# Patient Record
Sex: Female | Born: 1940 | Race: White | Hispanic: No | Marital: Married | Smoking: Never smoker
Health system: Southern US, Community
[De-identification: ages and names within clinical notes are randomized; demographics above are authoritative.]

## PROBLEM LIST (undated history)

## (undated) DIAGNOSIS — E079 Disorder of thyroid, unspecified: Secondary | ICD-10-CM

## (undated) DIAGNOSIS — I10 Essential (primary) hypertension: Secondary | ICD-10-CM

## (undated) DIAGNOSIS — C801 Malignant (primary) neoplasm, unspecified: Secondary | ICD-10-CM

## (undated) DIAGNOSIS — E7800 Pure hypercholesterolemia, unspecified: Secondary | ICD-10-CM

## (undated) HISTORY — PX: BREAST CYST ASPIRATION: SHX578

## (undated) HISTORY — PX: HERNIA REPAIR: SHX51

## (undated) HISTORY — PX: OOPHORECTOMY: SHX86

---

## 2004-03-14 ENCOUNTER — Ambulatory Visit: Payer: Self-pay | Admitting: Family Medicine

## 2005-03-19 ENCOUNTER — Ambulatory Visit: Payer: Self-pay | Admitting: Family Medicine

## 2006-07-18 ENCOUNTER — Ambulatory Visit: Payer: Self-pay | Admitting: Family Medicine

## 2006-10-02 ENCOUNTER — Ambulatory Visit: Payer: Self-pay | Admitting: Gastroenterology

## 2007-09-09 ENCOUNTER — Ambulatory Visit: Payer: Self-pay | Admitting: Family Medicine

## 2008-09-13 ENCOUNTER — Ambulatory Visit: Payer: Self-pay | Admitting: Family Medicine

## 2009-07-25 ENCOUNTER — Ambulatory Visit: Payer: Self-pay

## 2009-08-05 ENCOUNTER — Ambulatory Visit: Payer: Self-pay

## 2009-09-07 ENCOUNTER — Ambulatory Visit: Payer: Self-pay | Admitting: Obstetrics & Gynecology

## 2009-09-20 ENCOUNTER — Ambulatory Visit: Payer: Self-pay | Admitting: Obstetrics & Gynecology

## 2009-09-23 LAB — PATHOLOGY REPORT

## 2009-11-14 ENCOUNTER — Encounter: Payer: Self-pay | Admitting: Family Medicine

## 2009-11-14 ENCOUNTER — Ambulatory Visit: Payer: Self-pay | Admitting: Family Medicine

## 2009-12-03 ENCOUNTER — Encounter: Payer: Self-pay | Admitting: Family Medicine

## 2010-01-03 ENCOUNTER — Encounter: Payer: Self-pay | Admitting: Family Medicine

## 2010-12-07 ENCOUNTER — Ambulatory Visit: Payer: Self-pay | Admitting: Family Medicine

## 2010-12-15 ENCOUNTER — Ambulatory Visit: Payer: Self-pay | Admitting: Internal Medicine

## 2010-12-19 ENCOUNTER — Emergency Department: Payer: Self-pay | Admitting: Emergency Medicine

## 2011-07-02 ENCOUNTER — Ambulatory Visit: Payer: Self-pay | Admitting: Family Medicine

## 2011-12-11 ENCOUNTER — Ambulatory Visit: Payer: Self-pay | Admitting: Family Medicine

## 2012-11-07 HISTORY — PX: BREAST BIOPSY: SHX20

## 2013-11-06 ENCOUNTER — Emergency Department: Payer: Self-pay | Admitting: Emergency Medicine

## 2013-11-28 ENCOUNTER — Inpatient Hospital Stay: Payer: Self-pay | Admitting: Internal Medicine

## 2013-11-28 LAB — COMPREHENSIVE METABOLIC PANEL
ALBUMIN: 3.2 g/dL — AB (ref 3.4–5.0)
AST: 16 U/L (ref 15–37)
Alkaline Phosphatase: 62 U/L
Anion Gap: 4 — ABNORMAL LOW (ref 7–16)
BILIRUBIN TOTAL: 0.4 mg/dL (ref 0.2–1.0)
BUN: 18 mg/dL (ref 7–18)
Calcium, Total: 9.2 mg/dL (ref 8.5–10.1)
Chloride: 106 mmol/L (ref 98–107)
Co2: 31 mmol/L (ref 21–32)
Creatinine: 1.27 mg/dL (ref 0.60–1.30)
EGFR (Non-African Amer.): 44 — ABNORMAL LOW
GFR CALC AF AMER: 53 — AB
Glucose: 107 mg/dL — ABNORMAL HIGH (ref 65–99)
Osmolality: 284 (ref 275–301)
Potassium: 4.1 mmol/L (ref 3.5–5.1)
SGPT (ALT): 21 U/L
SODIUM: 141 mmol/L (ref 136–145)
Total Protein: 6.9 g/dL (ref 6.4–8.2)

## 2013-11-28 LAB — URINALYSIS, COMPLETE
BLOOD: NEGATIVE
Bacteria: NONE SEEN
Bilirubin,UR: NEGATIVE
GLUCOSE, UR: NEGATIVE mg/dL (ref 0–75)
Ketone: NEGATIVE
LEUKOCYTE ESTERASE: NEGATIVE
Nitrite: NEGATIVE
Ph: 6 (ref 4.5–8.0)
Protein: NEGATIVE
SPECIFIC GRAVITY: 1.01 (ref 1.003–1.030)

## 2013-11-28 LAB — CBC
HCT: 36.3 % (ref 35.0–47.0)
HGB: 11.4 g/dL — ABNORMAL LOW (ref 12.0–16.0)
MCH: 28.1 pg (ref 26.0–34.0)
MCHC: 31.4 g/dL — AB (ref 32.0–36.0)
MCV: 90 fL (ref 80–100)
PLATELETS: 304 10*3/uL (ref 150–440)
RBC: 4.05 10*6/uL (ref 3.80–5.20)
RDW: 14.5 % (ref 11.5–14.5)
WBC: 8.4 10*3/uL (ref 3.6–11.0)

## 2013-11-28 LAB — LIPASE, BLOOD: LIPASE: 97 U/L (ref 73–393)

## 2013-11-29 LAB — CBC WITH DIFFERENTIAL/PLATELET
BASOS ABS: 0.1 10*3/uL (ref 0.0–0.1)
BASOS PCT: 1 %
Eosinophil #: 0.3 10*3/uL (ref 0.0–0.7)
Eosinophil %: 3.7 %
HCT: 35.1 % (ref 35.0–47.0)
HGB: 11.3 g/dL — ABNORMAL LOW (ref 12.0–16.0)
LYMPHS ABS: 1.5 10*3/uL (ref 1.0–3.6)
Lymphocyte %: 18.8 %
MCH: 28.4 pg (ref 26.0–34.0)
MCHC: 32 g/dL (ref 32.0–36.0)
MCV: 89 fL (ref 80–100)
Monocyte #: 0.9 x10 3/mm (ref 0.2–0.9)
Monocyte %: 10.7 %
NEUTROS ABS: 5.3 10*3/uL (ref 1.4–6.5)
NEUTROS PCT: 65.8 %
PLATELETS: 273 10*3/uL (ref 150–440)
RBC: 3.96 10*6/uL (ref 3.80–5.20)
RDW: 14.6 % — AB (ref 11.5–14.5)
WBC: 8.1 10*3/uL (ref 3.6–11.0)

## 2013-11-29 LAB — BASIC METABOLIC PANEL
Anion Gap: 4 — ABNORMAL LOW (ref 7–16)
BUN: 14 mg/dL (ref 7–18)
CHLORIDE: 106 mmol/L (ref 98–107)
CREATININE: 1.21 mg/dL (ref 0.60–1.30)
Calcium, Total: 8.5 mg/dL (ref 8.5–10.1)
Co2: 31 mmol/L (ref 21–32)
EGFR (African American): 56 — ABNORMAL LOW
GFR CALC NON AF AMER: 46 — AB
Glucose: 87 mg/dL (ref 65–99)
Osmolality: 281 (ref 275–301)
Potassium: 4.4 mmol/L (ref 3.5–5.1)
Sodium: 141 mmol/L (ref 136–145)

## 2013-11-30 LAB — CBC WITH DIFFERENTIAL/PLATELET
Basophil #: 0.1 10*3/uL (ref 0.0–0.1)
Basophil %: 0.9 %
EOS ABS: 0.4 10*3/uL (ref 0.0–0.7)
EOS PCT: 5.2 %
HCT: 34.8 % — ABNORMAL LOW (ref 35.0–47.0)
HGB: 11.1 g/dL — AB (ref 12.0–16.0)
Lymphocyte #: 1.6 10*3/uL (ref 1.0–3.6)
Lymphocyte %: 23 %
MCH: 28.3 pg (ref 26.0–34.0)
MCHC: 31.8 g/dL — ABNORMAL LOW (ref 32.0–36.0)
MCV: 89 fL (ref 80–100)
Monocyte #: 0.8 x10 3/mm (ref 0.2–0.9)
Monocyte %: 11 %
NEUTROS PCT: 59.9 %
Neutrophil #: 4.2 10*3/uL (ref 1.4–6.5)
Platelet: 277 10*3/uL (ref 150–440)
RBC: 3.91 10*6/uL (ref 3.80–5.20)
RDW: 14.6 % — AB (ref 11.5–14.5)
WBC: 7 10*3/uL (ref 3.6–11.0)

## 2013-11-30 LAB — COMPREHENSIVE METABOLIC PANEL
ALBUMIN: 2.9 g/dL — AB (ref 3.4–5.0)
ALK PHOS: 48 U/L
ANION GAP: 4 — AB (ref 7–16)
BUN: 12 mg/dL (ref 7–18)
Bilirubin,Total: 0.4 mg/dL (ref 0.2–1.0)
CALCIUM: 8 mg/dL — AB (ref 8.5–10.1)
CHLORIDE: 106 mmol/L (ref 98–107)
CO2: 30 mmol/L (ref 21–32)
Creatinine: 1.3 mg/dL (ref 0.60–1.30)
EGFR (African American): 52 — ABNORMAL LOW
EGFR (Non-African Amer.): 43 — ABNORMAL LOW
GLUCOSE: 92 mg/dL (ref 65–99)
OSMOLALITY: 279 (ref 275–301)
POTASSIUM: 4.2 mmol/L (ref 3.5–5.1)
SGOT(AST): 16 U/L (ref 15–37)
SGPT (ALT): 16 U/L
Sodium: 140 mmol/L (ref 136–145)
Total Protein: 6.1 g/dL — ABNORMAL LOW (ref 6.4–8.2)

## 2013-12-01 LAB — URINALYSIS, COMPLETE
BACTERIA: NONE SEEN
BILIRUBIN, UR: NEGATIVE
Blood: NEGATIVE
GLUCOSE, UR: NEGATIVE mg/dL (ref 0–75)
KETONE: NEGATIVE
Nitrite: NEGATIVE
PH: 5 (ref 4.5–8.0)
PROTEIN: NEGATIVE
SPECIFIC GRAVITY: 1.009 (ref 1.003–1.030)
Squamous Epithelial: 1

## 2013-12-03 LAB — CBC WITH DIFFERENTIAL/PLATELET
BASOS ABS: 0.1 10*3/uL (ref 0.0–0.1)
Basophil %: 0.8 %
Eosinophil #: 0.5 10*3/uL (ref 0.0–0.7)
Eosinophil %: 7 %
HCT: 33.4 % — AB (ref 35.0–47.0)
HGB: 10.7 g/dL — ABNORMAL LOW (ref 12.0–16.0)
Lymphocyte #: 1.3 10*3/uL (ref 1.0–3.6)
Lymphocyte %: 19.3 %
MCH: 28.7 pg (ref 26.0–34.0)
MCHC: 32.1 g/dL (ref 32.0–36.0)
MCV: 89 fL (ref 80–100)
MONOS PCT: 12.8 %
Monocyte #: 0.9 x10 3/mm (ref 0.2–0.9)
Neutrophil #: 4.2 10*3/uL (ref 1.4–6.5)
Neutrophil %: 60.1 %
Platelet: 289 10*3/uL (ref 150–440)
RBC: 3.74 10*6/uL — ABNORMAL LOW (ref 3.80–5.20)
RDW: 14.9 % — ABNORMAL HIGH (ref 11.5–14.5)
WBC: 6.9 10*3/uL (ref 3.6–11.0)

## 2013-12-03 LAB — BASIC METABOLIC PANEL
ANION GAP: 3 — AB (ref 7–16)
BUN: 14 mg/dL (ref 7–18)
CO2: 30 mmol/L (ref 21–32)
Calcium, Total: 8.4 mg/dL — ABNORMAL LOW (ref 8.5–10.1)
Chloride: 107 mmol/L (ref 98–107)
Creatinine: 1.48 mg/dL — ABNORMAL HIGH (ref 0.60–1.30)
EGFR (African American): 45 — ABNORMAL LOW
EGFR (Non-African Amer.): 37 — ABNORMAL LOW
Glucose: 94 mg/dL (ref 65–99)
OSMOLALITY: 280 (ref 275–301)
Potassium: 4.1 mmol/L (ref 3.5–5.1)
Sodium: 140 mmol/L (ref 136–145)

## 2013-12-03 LAB — CULTURE, BLOOD (SINGLE)

## 2013-12-17 ENCOUNTER — Ambulatory Visit: Payer: Self-pay | Admitting: Family Medicine

## 2014-06-26 NOTE — H&P (Signed)
PATIENT NAME:  Alicia Conley, CLEEK MR#:  [id number] DATE OF BIRTH:  05/28/1940  DATE OF ADMISSION:  11/28/2013  REFERRING EMERGENCY ROOM PHYSICIAN: Elta Guadeloupe R. Jacqualine Code, MD.   PRIMARY CARE PHYSICIAN: [name], MD, at [facility].   CHIEF COMPLAINT: Right-sided flank pain, right upper quadrant abdominal pain.   HISTORY OF PRESENT ILLNESS: This very pleasant, 74 year old woman with past medical history of hypertension, hyperlipidemia, obstructive sleep apnea on CPAP, presents today with 2 days of right-sided flank/right upper quadrant abdominal pain. She states that the pain started abruptly 2 days ago and has continued since. She states that the pain is like someone squeezing this area and then letting go. Nothing improves the pain. Movement definitely makes the pain worse. At its worst, the pain 15/10 on a 10 pain scale. Currently in the emergency room after receiving morphine, it is at about a 2. She denies any nausea, vomiting, but states her appetite has been decreased. No diarrhea. She denies any dysuria or urinary frequency. She states that she has never had similar symptoms. She has no skin rash. No fevers measured, but she states that she has felt hot and cold and slightly diaphoretic over the past few days. Upon presentation to the emergency room, she has had an abdominal ultrasound, which is negative for cholecystitis, positive for cholelithiasis. She also had a CT scan of her abdomen which shows sigmoid diverticulitis with possible microperforation. Hospitalist service is asked to admit for further evaluation and treatment.   PAST MEDICAL HISTORY: 1. Hypertension.  2. Hyperlipidemia.  3. Obstructive sleep apnea, compliant with CPAP.  4. Hypothyroidism.  5. Gastroesophageal reflux disease  6. Mechanical fall on 11/06/2013 with significant injury to the right lower extremity, no fracture.  PAST SURGERIES:  1. Hernia repair.  2. Oophorectomy.   SOCIAL HISTORY: The patient lives  with her husband. She quit smoking 19 to 20 years ago.  Was a light smoker prior to that. She has never consumed alcohol or illicit substances. She has been walking with a cane or a walker since her fall 1 month ago. She states that actually she has been very sedentary since that fall and had her leg elevated for most of the time. She is retired and used to work in a Special educational needs teacher.   FAMILY HISTORY: Her mother and father both had heart attacks and strokes. She has a sister with diabetes type 2. There is no family history of colon cancer.   ALLERGIES: She has no known drug allergies.   HOME MEDICATIONS: 1. Vitamin D3. She takes 1000 international units daily.  2. Triamcinolone 0.1% topical cream.  3. Simvastatin 20 mg oral 1 tablet daily.  4. Omeprazole 20 mg 1 tablet daily.  5. Metoprolol succinate 100 mg 1 tablet twice a day.  6. Levothyroxine 75 mcg 1 tablet once a day.  7. Ketoconazole topical 2% cream applied topically to affected area twice a week.  8. Hydrochlorothiazide 25 mg 1 tablet daily.  9. Gabapentin 600 mg 1 tablet twice a day.  10. Folic acid 0.8 mg 1 tablet daily.  11. Enalapril 20 mg 1 tablet twice a day.  12. Desonide topical 0.05% cream applied once a day as needed.  13. Cyanocobalamin 1000 mcg oral tablet 1 tablet once a day.  14. Coenzyme-Q 200 mg 1 tablet daily.  15. Clobetasol topical cream 0.05 applied topically to affected area twice a day.  16. Calcium, magnesium and zinc supplement 1 tablet twice a day.  17. Aspirin enteric  coated 2 tablets daily.  18. Acetaminophen/hydrocodone 325 mg/5 mg 1 tablet every 8 hours as needed for back pain.   REVIEW OF SYSTEMS:  CONSTITUTIONAL: Positive for chills, a subjective fever and fatigue. Negative for weakness or weight change.  HEENT: No change in vision or hearing. No pain in the eyes or ears. No difficulty swallowing or sore throat.  RESPIRATORY: No cough, shortness of breath, wheezing, asthma, painful respirations.   CARDIOVASCULAR: No chest pain, orthopnea, edema, palpitations, syncope.   GASTROINTESTINAL: No nausea, vomiting, diarrhea, positive for abdominal pain in the right upper quadrant as mentioned. Positive for decreased appetite, positive for history of GERD. No history of rectal bleeding.  GENITOURINARY: Negative for dysuria or frequency.  MUSCULOSKELETAL: Positive for pain in the right lower extremity after a fall. Pain is mainly from above the knee down to the ankle. Also erythema, tenderness and decreased strength in that leg. Otherwise, no other warm, tender or painful joints.  NEUROLOGIC: No focal numbness or weakness. No dysarthria or confusion. No headache, seizure, history of stroke.  PSYCHIATRIC: No change in baseline mood. No history of uncontrolled anxiety or depression.   PHYSICAL EXAMINATION: VITAL SIGNS: Temperature 98.1, pulse 63, respirations 14, blood pressure 118/44, oxygenation 98% on room air.  GENERAL: Patient is uncomfortable, lying in the exam bed.  HEENT: Pupils are round and reactive to light. Conjunctivae are clear with no icterus or injection. Extraocular motion is intact. There is no nasal discharge. Oropharynx is clear. She has upper and lower dentures. There are no oral lesions. Posterior oropharynx does not have any exudate or edema. She does have large tonsils.  NECK: No cervical lymphadenopathy. Trachea is midline. Thyroid is nontender. No thyroid nodule noted.  RESPIRATORY: Lungs are clear to auscultation bilaterally with good air movement.  CARDIOVASCULAR: Heart sounds are distant, regular. No murmurs, rubs or gallops. No peripheral edema. Peripheral pulses are 1+.  ABDOMEN: Diffusely tender, obese. Not distended. Pain seems to be worse in the right upper quadrant as well as the right flank starting at the spine and wrapping around the ribs anteriorly. There is no rash. Bowel sounds are normal.  SKIN: No rash, no open wounds. There is significant erythema and  induration in the right lower extremity from the knee down to the ankle from her prior fall. This area is very tender.  MUSCULOSKELETAL: Bruising around the knee, no effusion. Range of motion of the knee and ankle are normal on the right. She is able to move all extremities equally.  NEUROLOGIC: Cranial nerves 2 through 12 are grossly intact. Strength and sensation intact. This is a nonfocal neurologic examination.  PSYCHIATRIC: The patient is alert and oriented x 4, normal affect, good insight.   LABORATORY DATA: White blood cells 8.4, hemoglobin 11.4, platelets 304,000, MCV is 90. Sodium 141, potassium 4.1, chloride 106, bicarbonate 31. BUN 18, creatinine 1.27, glucose 107. LFTs are normal with the exception of a decreased albumin at 3.2. Lipase 97. Urine negative for signs of infection.   IMAGING:  1. Ultrasound of the abdomen shows cholelithiasis with nonspecific sonographic Murphy's signal. No wall thickening or pericolic cystic fluid to suggest cholecystitis. No biliary dilatation. There are hepatic cysts.  2. CT abdomen and pelvis with contrast shows significant diverticulosis, sigmoid diverticulitis and question of microperforation. No free intraperitoneal air or abscess. There are postoperative changes in the infraumbilical anterior abdominal wall consistent with a prior hernia repair. No herniated bowel. There is cholelithiasis. Left lower lobe and lingular atelectasis. Many hepatic cysts.  ASSESSMENT AND PLAN: 1. Diverticulitis and possible microperforation as seen on CT scan: Continue with Cipro and Flagyl. Continue clear diet. Collect blood cultures. If symptoms get worse, would image again and possibly consult surgery for concern of perforation. She did have right flank and upper quadrant pain, which is less consistent with these findings. If pain continues to be localized, would consider ultrasound of the gallbladder again to see if cholecystitis is developing. I also note that the pain  does seem to be in a dermatologic distribution. Consider zoster prodrome and watch for rash. She states that she has had a booster in the past.  2. Recent fall with injury to the right leg: She has had x-rays showing no fracture. She has been following with orthopedics. Her leg has continued to be erythematous, warm and tender with no open wound. We will continue with elevation. We will get a Doppler to rule out deep vein thrombosis time.  3. Chronic kidney disease stage 3: This is likely chronic. Continue to monitor with a.m. labs.  4. Anemia: This is mild and likely chronic. Continue to monitor with a.m. labs. No concern for current bleeding.  5. Hypertension: Controlled at this time. Continue home regimen.  6. Hypothyroidism: Continue levothyroxine.  7. Hyperlipidemia: Continue statin.  8. Prophylaxis: Heparin for deep vein thrombosis prophylaxis. Protonix, as she does have a history of gastroesophageal reflux disease and is on omeprazole at home.   TIME SPENT ON THIS ADMISSION: 45 minutes.     ____________________________ Earleen Newport. Volanda Napoleon, MD cpw:TT D: 11/28/2013 16:47:45 ET T: 11/28/2013 17:47:21 ET JOB#: [id number]  cc: Barnetta Chapel P. Volanda Napoleon, MD, <Dictator> Floria Raveling. Astrid Divine, MD Aldean Jewett MD ELECTRONICALLY SIGNED 11/29/2013 15:04

## 2014-06-26 NOTE — Consult Note (Signed)
PATIENT NAME:  Alicia Conley, Alicia Conley MR#:  [id number] DATE OF BIRTH:  December 25, 1940  DATE OF CONSULTATION:  11/30/2013  REFERRING PHYSICIAN:   CONSULTING PHYSICIAN:  Harrell Gave A. Sam Overbeck, MD  REASON FOR CONSULTATION: Right-sided back pain radiating to the front and a CT scan concerning for diverticulitis.   HISTORY OF PRESENT ILLNESS: Alicia Conley is a pleasant 74 year old female with history of prior abdominal surgery in 2009 from a hernia secondary to a previous surgery from endometriosis at which time they took out her ovaries as well as hypertension who presents with now 4 days of right-sided flank and back pain going up radiating to the right upper quadrant. Says it began approximately 4 days ago acutely in the evening, has continued to hurt but has improved and almost resolved with Vicodin and initially morphine, but Vicodin works better. She says that movement makes the pain worse. She did have some nausea today with p.o. but did not report any nausea at the time of admission. No fevers, chills, night sweats, shortness of breath, cough, chest pain, diarrhea, constipation, dysuria or hematuria.   She had a CT scan which showed diverticulitis with possible perforation and contrast extravasation, was noted also to have gallstones; however, labs have been completely normal during admission.   PAST MEDICAL HISTORY:  1.  History of a hernia repair in 2009 at [facility].  2.  History of abdominal surgery for endometriosis at which time they removed her ovaries but did not remove her uterus, according to her.  3.  Hypertension.  4.  Hyperlipidemia.  5.  Obstructive sleep apnea.  6.  History of fall on 11/06/2013 with pain in the right lower extremity, did not have this pain at that time. 7.  Hypothyroidism.  8.  Gastroesophageal reflux disease.   SOCIAL HISTORY: No tobacco or alcohol use for at least 20 years. Retired from work at a [employer] educational needs teacher.  FAMILY HISTORY: Two granddaughters and a sister with  diabetes mellitus. Mother and father history of MIs and strokes. No history of colon cancer. Last colonoscopy with Dr. Allen Norris in 2008.   REVIEW OF SYSTEMS: Twelve-point review of systems was obtained. Pertinent positives and negatives above.   ALLERGIES: No known drug allergies.   HOME MEDICATIONS:  1.  Vitamin D3. 2.  Triamcinolone.  3.  Simvastatin. 4.  5.  Metoprolol.  6.  L-thyroxine.  7.  Ketaconazole topical.  8.  Hydrochlorothiazide. 9.  Neurontin.  10.  Folate.  11.  Enalapril.  12.  Desonide topical.  13.  Cyanocobalamin p.o. daily.  14.  Coenzyme Q.  15.  Clobetasol topical.  16.  Calcium.  17.  Magnesium zinc supplement.  18.  Aspirin enteric-coated b.i.d.  19.  Norco p.r.n. for back pain.   PHYSICAL EXAMINATION:  VITAL SIGNS: Currently, temperature of 97.9, pulse 63, blood pressure 134/73, respirations 18.  GENERAL: No acute distress. Alert and oriented x 3.  HEAD: Normocephalic, atraumatic.  EYES: No scleral icterus. No conjunctivitis.  FACE: No evidence of facial trauma. Normal external nose. Normal external ears. CHEST: Lungs clear to auscultation. Moving air well.  HEART: Regular rate and rhythm. No murmurs, rubs or gallops.  ABDOMEN: Soft, tender to palpation, right upper quadrant as well as suprapubic region.  EXTREMITIES: Moves all extremities well. Strength 5/5.  NEUROLOGIC: Cranial nerves II-XII grossly intact. Sensation intact in all 4 extremities.   LABORATORY DATA: Currently white cell count of 7.0 with 60% neutrophils. Labs otherwise unremarkable.   IMAGING: CT scan shows diverticulitis with  some questionable microperforation with small amounts of fluid extravasation. History of abdominal hernia repair without herniation. Gallstones. Ultrasound shows cholelithiasis with no gallbladder wall thickening or pericholecystic fluid.   ASSESSMENT AND PLAN: Alicia Conley is a pleasant 74 year old with right back pain radiating to the front, also has a  questionable diverticulitis and suprapubic pain, is not interested in cholecystectomy, however, unlikely that two things are going on at once. Pain may be due to gallstones but would consider treating for diverticulitis and foregoing any workup of the gallbladder as well as surgical interventions due to possible complications of the previous hernia repair. Continue IV antibiotics, transition to p.o. when able to tolerate p.o. and pain is improved. We will continue to follow.    ____________________________ Glena Norfolk Jerimah Witucki, MD cal:TT D: 11/30/2013 20:07:33 ET T: 11/30/2013 20:46:56 ET JOB#: [id number]  cc: Harrell Gave A. Webber Michiels, MD, <Dictator> Floyde Parkins MD ELECTRONICALLY SIGNED 12/02/2013 0:59

## 2014-06-26 NOTE — Consult Note (Signed)
Brief Consult Note: Diagnosis: L5/S1 spondylolysis.   Patient was seen by consultant.   Comments: encouraged patient to ambulate with PT and to try exercise. has been to [place] [facility]. may need fustion when abdominal complaints better Recommend follow up with Dr Claiborne Rigg at [facility].  Electronic Signatures: Laurene Footman (MD)  (Signed 29-Sep-15 14:00)  Authored: Brief Consult Note   Last Updated: 29-Sep-15 14:00 by Laurene Footman (MD)

## 2014-06-26 NOTE — Discharge Summary (Signed)
PATIENT NAME:  Alicia Conley, Alicia Conley MR#:  [id number] DATE OF BIRTH:  Feb 16, 1941  DATE OF ADMISSION:  11/28/2013 DATE OF DISCHARGE:  12/03/2013  DISCHARGE DIAGNOSES.  1.  S1-L5 spondylolysis.  2.  Sigmoid diverticulitis.  3.  Mechanical fall on 12/06/2013 with significant injury to the right lower extremity, no fracture, no deep venous thrombosis.  4.  Chronic kidney disease.   5.  Chronic kidney disease stage III.  6.  Obstructive sleep apnea, on CPAP.  7.  Hypothyroidism.  8.  Hypertension.  9.  Hyperlipidemia.  10.  Gastroesophageal reflux disease.   CONSULTATIONS:  1.  Dr. Marlyce Huge, general surgery.  2.  Dr. Legrand Como, orthopedics.   PROCEDURES:  1.  CT abdomen and pelvis with contrast September 26. It showed significant diverticulosis. Sigmoid diverticulitis with question of microperforation. No intraperitoneal free air or abscess. Postoperative changes in the infraumbilical anterior abdominal wall consistent with prior hernia repair. No herniating bowel. Cholelithiasis without cholecystitis. Left lower lobe and lingular atelectasis. Numerous hepatic cysts.  2.  Ultrasound of the abdomen shows cholelithiasis with nonspecific sonographic Murphy's sign. No wall thickening or pericolic cystic fluid. No biliary dilation. Multiple hepatic cysts.   3.  Doppler of right lower extremity shows no evidence of DVT, September 27.  4.  Lumbar x-ray September 29, shows no acute findings. Mild multilevel lumbar spine degenerative disk disease worse at L2 and L3   HISTORY OF PRESENT ILLNESS: This very pleasant 75 year old woman with past medical history of hypertension, hyperlipidemia, obstructive sleep apnea on CPAP, chronic back pain, presents with 2 days of right-sided flank right upper quadrant abdominal pain. At its worst, pain is described as 15/10 on a 10 pain scale. Denies any other gastrointestinal symptoms. Denies any skin rash, fevers.   HOSPITAL COURSE BY PROBLEM:  1.  S1-L5  spondylolysis: Her right-sided flank and upper abdominal pain is most likely attributed to radiculopathy from this area. She was seen by Dr. Rudene Christians in hospital who recommended she follow with [facility]. She has an appointment made with Dr. Claiborne Rigg on 12/12/2013. Her pain is currently controlled on prior oral regimen. Differential also included cholecystitis; however, ultrasound and abdominal CT were negative. She never developed a skin rash to suggest varicella zoster infection.  2.  Sigmoid diverticulitis with possible microperforation seen on abdominal CT. Blood cultures were negative. She was started on IV Cipro and Flagyl and changed to p.o. and will continue for a complete 10 day course. She has been tolerating a full diet. She has mild diffuse abdominal tenderness with deep palpation at the time of discharge. Otherwise, she is eating, passing stool. No significant discomfort.  3.  Recent mechanical fall September 4 with injury to the right leg: Doppler was performed to DVT. There was no fracture. She will continue with prior outpatient treatment plan for this.  4.  Chronic kidney disease stage III: Stable. She will continue to follow in the outpatient setting for this.   5.  Hypertension: Her blood pressure remained controlled during this hospitalization on her home regimen. No changes were made.   DISCHARGE MEDICATIONS: 1.  Metoprolol succinate 100 mg tablet extended release, 1 tablet twice a day.  2.  Omeprazole 20 mg 1 tablet daily.  3.  Hydrochlorothiazide 25 mg 1 tablet daily.  4.  Simvastatin 20 mg 1 tablet daily.  5.  Enalapril 20 mg 1 tablet twice a day.  6.  Vitamin D3 at 1000 international units daily.  7.  Coenzyme  Q10 at 200 mg daily.  8.  Folic acid 0.8 mg 1 tablet daily.  9.  Aspirin 81 mg 1 tablet daily.  10.   Calcium, magnesium, and zinc 2 tablets daily.  11.   Gabapentin 60 mg 1 tablet orally twice a day.  12.   Levothyroxine 75 mg 1 tablet once a day.   13.   Clobetasol topical 0.05% topical cream, apply to affected area 2 times a day.  14.   Ketoconazole topical 2% shampoo, apply to affected area 2 times a week.  15.   Desonide topical 0.05 topical cream, apply to affected area once a day as needed.  16.   Cyanocobalamin 1000 mcg oral tablet 1 tablet once a day.  17.   Acetaminophen/hydrocodone 325 mg/5 mg oral tablet 1 tablet every 8 hours as needed for pain.  18.   Metronidazole 500 mg oral tablet 1 tablet orally every 8 hours until 12/07/2013 and then stop.  19.   Ciprofloxacin 500 mg oral tablet 1 tablet orally every 12 hours until 12/07/2013 and then stop.   PHYSICAL EXAMINATION ON DAY OF DISCHARGE:  VITAL SIGNS: Temperature 98, heart rate 60, respirations 18, blood pressure 173/83, oxygenation 95% on room air.  GENERAL: No acute distress.  CARDIOVASCULAR: Regular rate and rhythm, no murmurs, rubs, or gallops.  PULMONARY: Lungs are clear to auscultation bilaterally with good air movement.  ABDOMEN: Soft, bowel sounds are normal. There is diffuse mild tenderness with deep palpation.  MUSCULOSKELETAL: There is tenderness with palpation or with torso movement from the spine in the lumbar area wrapping around the right flank and extending into the right groin.  SKIN: No rash or skin change noted.  NEUROLOGIC: Cranial nerves II through XII are grossly intact. Strength and sensation are intact, nonfocal.  PSYCHIATRIC: Calm, alert, and oriented x 4, good insight.   LABORATORY DATA: Sodium 140, potassium 4.1, chloride 107, bicarbonate 30, BUN 14, creatinine 1.48. LFTs normal with the exception of a low serum albumin at 2.9, low serum protein at 6.1. Hemoglobin 10.7, white blood cell count 6.9, platelets 289,000, MCV 89. Blood cultures no growth. Urinalysis shows no sign of infection.   CONDITION ON DISCHARGE: Stable.   DISPOSITION: The patient is discharged home. Her husband is accompanying her and will assist in her care.   INSTRUCTIONS:  She is to maintain a low-fat, low-cholesterol diet. No dietary supplements.   ACTIVITY: As tolerated.   FOLLOWUP: Will follow up on 10/08 at the [facility], and will follow up in 1 to 2 weeks with her primary care physician.   TIME SPENT ON DISCHARGE: 45 minutes.    ____________________________ Earleen Newport. Volanda Napoleon, MD cpw:at D: 12/05/2013 11:24:44 ET T: 12/05/2013 19:09:37 ET JOB#: [id number]  cc: Barnetta Chapel P. Volanda Napoleon, MD, <Dictator> Aldean Jewett MD ELECTRONICALLY SIGNED 12/09/2013 13:33

## 2014-10-20 ENCOUNTER — Other Ambulatory Visit: Payer: Self-pay | Admitting: Family Medicine

## 2014-10-20 DIAGNOSIS — E079 Disorder of thyroid, unspecified: Secondary | ICD-10-CM

## 2014-10-20 DIAGNOSIS — M8580 Other specified disorders of bone density and structure, unspecified site: Secondary | ICD-10-CM

## 2014-11-29 ENCOUNTER — Other Ambulatory Visit: Payer: Self-pay | Admitting: Family Medicine

## 2014-11-29 DIAGNOSIS — Z1231 Encounter for screening mammogram for malignant neoplasm of breast: Secondary | ICD-10-CM

## 2014-12-21 ENCOUNTER — Ambulatory Visit
Admission: RE | Admit: 2014-12-21 | Discharge: 2014-12-21 | Disposition: A | Discharge status: Home / Self Care | Payer: Medicare Other | Source: Ambulatory Visit | Attending: Family Medicine | Admitting: Family Medicine

## 2014-12-21 DIAGNOSIS — Z1231 Encounter for screening mammogram for malignant neoplasm of breast: Secondary | ICD-10-CM | POA: Insufficient documentation

## 2014-12-21 DIAGNOSIS — Z780 Asymptomatic menopausal state: Secondary | ICD-10-CM | POA: Diagnosis not present

## 2014-12-21 DIAGNOSIS — E079 Disorder of thyroid, unspecified: Secondary | ICD-10-CM

## 2014-12-21 DIAGNOSIS — M8580 Other specified disorders of bone density and structure, unspecified site: Secondary | ICD-10-CM | POA: Diagnosis not present

## 2015-12-01 ENCOUNTER — Other Ambulatory Visit: Payer: Self-pay | Admitting: Family Medicine

## 2015-12-01 DIAGNOSIS — Z1231 Encounter for screening mammogram for malignant neoplasm of breast: Secondary | ICD-10-CM

## 2015-12-22 ENCOUNTER — Ambulatory Visit
Admission: RE | Admit: 2015-12-22 | Discharge: 2015-12-22 | Disposition: A | Discharge status: Home / Self Care | Payer: Medicare HMO | Source: Ambulatory Visit | Attending: Family Medicine | Admitting: Family Medicine

## 2015-12-22 ENCOUNTER — Other Ambulatory Visit: Payer: Self-pay | Admitting: Family Medicine

## 2015-12-22 DIAGNOSIS — Z1231 Encounter for screening mammogram for malignant neoplasm of breast: Secondary | ICD-10-CM | POA: Diagnosis present

## 2016-01-18 ENCOUNTER — Other Ambulatory Visit: Payer: Self-pay | Admitting: Neurology

## 2016-01-18 DIAGNOSIS — M6281 Muscle weakness (generalized): Secondary | ICD-10-CM

## 2016-02-08 ENCOUNTER — Ambulatory Visit: Admission: RE | Admit: 2016-02-08 | Payer: Medicare HMO | Source: Ambulatory Visit

## 2016-02-23 ENCOUNTER — Ambulatory Visit
Admission: RE | Admit: 2016-02-23 | Discharge: 2016-02-23 | Disposition: A | Discharge status: Home / Self Care | Payer: Medicare HMO | Source: Ambulatory Visit | Attending: Neurology | Admitting: Neurology

## 2016-02-23 DIAGNOSIS — H0520 Unspecified exophthalmos: Secondary | ICD-10-CM | POA: Insufficient documentation

## 2016-02-23 DIAGNOSIS — M4802 Spinal stenosis, cervical region: Secondary | ICD-10-CM | POA: Insufficient documentation

## 2016-02-23 DIAGNOSIS — G9389 Other specified disorders of brain: Secondary | ICD-10-CM | POA: Diagnosis not present

## 2016-02-23 DIAGNOSIS — M6281 Muscle weakness (generalized): Secondary | ICD-10-CM | POA: Insufficient documentation

## 2016-04-03 ENCOUNTER — Encounter
Admission: RE | Disposition: A | Discharge status: Home / Self Care | Payer: Self-pay | Source: Ambulatory Visit | Attending: Cardiology

## 2016-04-03 ENCOUNTER — Ambulatory Visit
Admission: RE | Admit: 2016-04-03 | Discharge: 2016-04-03 | Disposition: A | Discharge status: Home / Self Care | Payer: Medicare HMO | Source: Ambulatory Visit | Attending: Cardiology | Admitting: Cardiology

## 2016-04-03 DIAGNOSIS — Z820 Family history of epilepsy and other diseases of the nervous system: Secondary | ICD-10-CM | POA: Insufficient documentation

## 2016-04-03 DIAGNOSIS — Z808 Family history of malignant neoplasm of other organs or systems: Secondary | ICD-10-CM | POA: Insufficient documentation

## 2016-04-03 DIAGNOSIS — R0600 Dyspnea, unspecified: Secondary | ICD-10-CM | POA: Insufficient documentation

## 2016-04-03 DIAGNOSIS — G4733 Obstructive sleep apnea (adult) (pediatric): Secondary | ICD-10-CM | POA: Diagnosis not present

## 2016-04-03 DIAGNOSIS — M1990 Unspecified osteoarthritis, unspecified site: Secondary | ICD-10-CM | POA: Insufficient documentation

## 2016-04-03 DIAGNOSIS — I10 Essential (primary) hypertension: Secondary | ICD-10-CM | POA: Insufficient documentation

## 2016-04-03 DIAGNOSIS — I6523 Occlusion and stenosis of bilateral carotid arteries: Secondary | ICD-10-CM | POA: Insufficient documentation

## 2016-04-03 DIAGNOSIS — Z79899 Other long term (current) drug therapy: Secondary | ICD-10-CM | POA: Insufficient documentation

## 2016-04-03 DIAGNOSIS — E039 Hypothyroidism, unspecified: Secondary | ICD-10-CM | POA: Insufficient documentation

## 2016-04-03 DIAGNOSIS — Z8349 Family history of other endocrine, nutritional and metabolic diseases: Secondary | ICD-10-CM | POA: Insufficient documentation

## 2016-04-03 DIAGNOSIS — Z90722 Acquired absence of ovaries, bilateral: Secondary | ICD-10-CM | POA: Insufficient documentation

## 2016-04-03 DIAGNOSIS — E785 Hyperlipidemia, unspecified: Secondary | ICD-10-CM | POA: Diagnosis not present

## 2016-04-03 DIAGNOSIS — I493 Ventricular premature depolarization: Secondary | ICD-10-CM | POA: Diagnosis not present

## 2016-04-03 DIAGNOSIS — F329 Major depressive disorder, single episode, unspecified: Secondary | ICD-10-CM | POA: Diagnosis not present

## 2016-04-03 DIAGNOSIS — K219 Gastro-esophageal reflux disease without esophagitis: Secondary | ICD-10-CM | POA: Diagnosis not present

## 2016-04-03 DIAGNOSIS — G8929 Other chronic pain: Secondary | ICD-10-CM | POA: Diagnosis not present

## 2016-04-03 DIAGNOSIS — L409 Psoriasis, unspecified: Secondary | ICD-10-CM | POA: Diagnosis not present

## 2016-04-03 DIAGNOSIS — Z8249 Family history of ischemic heart disease and other diseases of the circulatory system: Secondary | ICD-10-CM | POA: Insufficient documentation

## 2016-04-03 DIAGNOSIS — M8580 Other specified disorders of bone density and structure, unspecified site: Secondary | ICD-10-CM | POA: Diagnosis not present

## 2016-04-03 DIAGNOSIS — D680 Von Willebrand's disease: Secondary | ICD-10-CM | POA: Insufficient documentation

## 2016-04-03 DIAGNOSIS — M5442 Lumbago with sciatica, left side: Secondary | ICD-10-CM | POA: Diagnosis not present

## 2016-04-03 DIAGNOSIS — Z823 Family history of stroke: Secondary | ICD-10-CM | POA: Insufficient documentation

## 2016-04-03 DIAGNOSIS — Z7982 Long term (current) use of aspirin: Secondary | ICD-10-CM | POA: Diagnosis not present

## 2016-04-03 DIAGNOSIS — I491 Atrial premature depolarization: Secondary | ICD-10-CM | POA: Diagnosis not present

## 2016-04-03 DIAGNOSIS — Z9849 Cataract extraction status, unspecified eye: Secondary | ICD-10-CM | POA: Diagnosis not present

## 2016-04-03 DIAGNOSIS — R600 Localized edema: Secondary | ICD-10-CM | POA: Diagnosis present

## 2016-04-03 DIAGNOSIS — Z87891 Personal history of nicotine dependence: Secondary | ICD-10-CM | POA: Diagnosis not present

## 2016-04-03 HISTORY — PX: EP IMPLANTABLE DEVICE: SHX172B

## 2016-04-03 SURGERY — LOOP RECORDER INSERTION
Anesthesia: LOCAL

## 2016-04-03 MED ORDER — LIDOCAINE-EPINEPHRINE (PF) 1 %-1:200000 IJ SOLN
INTRAMUSCULAR | Status: AC
  Filled 2016-04-03: qty 30

## 2016-04-03 SURGICAL SUPPLY — 2 items
LOOP REVEAL LINQSYS (Prosthesis & Implant Heart) ×2 IMPLANT
PACK LOOP INSERTION (CUSTOM PROCEDURE TRAY) ×2 IMPLANT

## 2016-04-04 ENCOUNTER — Encounter: Payer: Self-pay | Admitting: Cardiology

## 2016-12-11 ENCOUNTER — Other Ambulatory Visit: Payer: Self-pay | Admitting: Family Medicine

## 2016-12-11 DIAGNOSIS — Z1231 Encounter for screening mammogram for malignant neoplasm of breast: Secondary | ICD-10-CM

## 2017-01-02 ENCOUNTER — Ambulatory Visit
Admission: RE | Admit: 2017-01-02 | Discharge: 2017-01-02 | Disposition: A | Discharge status: Home / Self Care | Payer: Medicare HMO | Source: Ambulatory Visit | Attending: Family Medicine | Admitting: Family Medicine

## 2017-01-02 DIAGNOSIS — Z1231 Encounter for screening mammogram for malignant neoplasm of breast: Secondary | ICD-10-CM | POA: Diagnosis not present

## 2017-07-08 ENCOUNTER — Other Ambulatory Visit: Payer: Self-pay | Admitting: Family Medicine

## 2017-07-08 ENCOUNTER — Ambulatory Visit
Admission: RE | Admit: 2017-07-08 | Discharge: 2017-07-08 | Disposition: A | Discharge status: Home / Self Care | Payer: Medicare HMO | Source: Ambulatory Visit | Attending: Family Medicine | Admitting: Family Medicine

## 2017-07-08 DIAGNOSIS — M79672 Pain in left foot: Secondary | ICD-10-CM

## 2017-07-08 DIAGNOSIS — M7989 Other specified soft tissue disorders: Secondary | ICD-10-CM | POA: Insufficient documentation

## 2017-07-08 DIAGNOSIS — S92912A Unspecified fracture of left toe(s), initial encounter for closed fracture: Secondary | ICD-10-CM | POA: Insufficient documentation

## 2017-07-08 DIAGNOSIS — X58XXXA Exposure to other specified factors, initial encounter: Secondary | ICD-10-CM | POA: Insufficient documentation

## 2017-08-16 ENCOUNTER — Emergency Department
Admission: EM | Admit: 2017-08-16 | Discharge: 2017-08-16 | Disposition: A | Discharge status: Home / Self Care | Payer: Medicare HMO | Attending: Emergency Medicine | Admitting: Emergency Medicine

## 2017-08-16 ENCOUNTER — Encounter: Payer: Self-pay | Admitting: Emergency Medicine

## 2017-08-16 ENCOUNTER — Other Ambulatory Visit: Payer: Self-pay

## 2017-08-16 DIAGNOSIS — I10 Essential (primary) hypertension: Secondary | ICD-10-CM | POA: Insufficient documentation

## 2017-08-16 DIAGNOSIS — K5900 Constipation, unspecified: Secondary | ICD-10-CM | POA: Diagnosis not present

## 2017-08-16 DIAGNOSIS — Z79899 Other long term (current) drug therapy: Secondary | ICD-10-CM | POA: Diagnosis not present

## 2017-08-16 DIAGNOSIS — K529 Noninfective gastroenteritis and colitis, unspecified: Secondary | ICD-10-CM | POA: Diagnosis not present

## 2017-08-16 DIAGNOSIS — R1032 Left lower quadrant pain: Secondary | ICD-10-CM | POA: Diagnosis present

## 2017-08-16 DIAGNOSIS — Z7982 Long term (current) use of aspirin: Secondary | ICD-10-CM | POA: Diagnosis not present

## 2017-08-16 HISTORY — DX: Pure hypercholesterolemia, unspecified: E78.00

## 2017-08-16 HISTORY — DX: Disorder of thyroid, unspecified: E07.9

## 2017-08-16 HISTORY — DX: Essential (primary) hypertension: I10

## 2017-08-16 LAB — COMPREHENSIVE METABOLIC PANEL
ALBUMIN: 4 g/dL (ref 3.5–5.0)
ALT: 32 U/L (ref 14–54)
AST: 42 U/L — AB (ref 15–41)
Alkaline Phosphatase: 61 U/L (ref 38–126)
Anion gap: 11 (ref 5–15)
BUN: 20 mg/dL (ref 6–20)
CHLORIDE: 101 mmol/L (ref 101–111)
CO2: 25 mmol/L (ref 22–32)
CREATININE: 1.03 mg/dL — AB (ref 0.44–1.00)
Calcium: 9.1 mg/dL (ref 8.9–10.3)
GFR calc Af Amer: 60 mL/min — ABNORMAL LOW (ref >60)
GFR, EST NON AFRICAN AMERICAN: 51 mL/min — AB (ref >60)
Glucose, Bld: 115 mg/dL — ABNORMAL HIGH (ref 65–99)
POTASSIUM: 3.5 mmol/L (ref 3.5–5.1)
SODIUM: 137 mmol/L (ref 135–145)
Total Bilirubin: 0.9 mg/dL (ref 0.3–1.2)
Total Protein: 7.7 g/dL (ref 6.5–8.1)

## 2017-08-16 LAB — CBC
HEMATOCRIT: 39.6 % (ref 35.0–47.0)
Hemoglobin: 13.1 g/dL (ref 12.0–16.0)
MCH: 28.4 pg (ref 26.0–34.0)
MCHC: 33 g/dL (ref 32.0–36.0)
MCV: 86.1 fL (ref 80.0–100.0)
Platelets: 326 10*3/uL (ref 150–440)
RBC: 4.6 MIL/uL (ref 3.80–5.20)
RDW: 15.2 % — AB (ref 11.5–14.5)
WBC: 13.5 10*3/uL — AB (ref 3.6–11.0)

## 2017-08-16 LAB — LACTIC ACID, PLASMA: Lactic Acid, Venous: 1.4 mmol/L (ref 0.5–1.9)

## 2017-08-16 LAB — LIPASE, BLOOD: LIPASE: 27 U/L (ref 11–51)

## 2017-08-16 MED ORDER — MAGNESIUM CITRATE PO SOLN: 1.0000 | Freq: Once | ORAL | Status: DC

## 2017-08-16 MED ORDER — FLEET ENEMA 7-19 GM/118ML RE ENEM: 1.0000 | ENEMA | Freq: Once | RECTAL | Status: DC

## 2017-08-16 NOTE — ED Provider Notes (Signed)
[facility] Emergency Department Provider Note  ____________________________________________   First MD Initiated Contact with Patient 08/16/17 1747     (approximate)  I have reviewed the triage vital signs and the nursing notes.   HISTORY  Chief Complaint Abdominal Pain    HPI Alicia Conley is a 77 y.o. female who sent to the emergency department by her primary care physician for evaluation of abnormal CT scan and "bowel obstruction".  Read of the CT scan is below.  Briefly the patient has had roughly 2 weeks of abdominal discomfort.  Her pain is primarily left lower quadrant she was seen in clinic 2 days ago and prescribed ciprofloxacin and Flagyl which she began taking.  She was seen again today with persistent pain so they performed a CT scan which was reportedly abnormal and advised her to come to the emergency department.  Her abdominal pain has been gradual onset slowly progressive now moderate severity intermittent left lower quadrant nonradiating some nausea no vomiting no constipation no diarrhea.  She does have a history of oophorectomy and a hernia repair in her abdomen otherwise no abdominal surgeries.  Impression: 1. The entire descending colon and sigmoid colon appear impacted with stool. There is minimal stranding adjacent to the descending colon which may indicate some inflammation though the wall here does not appear thickened. Distal to this, there does appear to be wall thickening involving the sigmoid colon which could represent colitis acting as partial distal colonic obstruction. Alternatively, the appearance of wall thickening in the sigmoid colon may simply reflect underdistention. There is sigmoid diverticulosis without evidence for active diverticulitis. Proximal to the descending colon, the ascending and transverse colons are mildly distended with air and liquefied stool. 2. Previous midline hernia repair with what appears to be  extensive fat necrosis and scarring, unchanged. 3. Multiple low-density lesions liver favored to be cysts. 4. Small low-density lesions kidneys favored to be cysts though cannot be definitively characterized as such due to size. 2 in the upper to mid left kidney and one in the lower pole right kidney are higher density than others. These may be high density cyst however solid lesions cannot completely be excluded. It is reassuring that they have not changed significantly compared with prior study   Past Medical History:  Diagnosis Date  . High cholesterol   . Hypertension   . Thyroid disease     There are no active problems to display for this patient.   Past Surgical History:  Procedure Laterality Date  . BREAST BIOPSY Left 11/07/12   u/s bx lt -neg  . BREAST CYST ASPIRATION Left    neg  . EP IMPLANTABLE DEVICE N/A 04/03/2016   Procedure: Loop Recorder Insertion;  Surgeon: Isaias Cowman, MD;  Location: [place] [facility];  Service: Cardiovascular;  Laterality: N/A;  . HERNIA REPAIR    . OOPHORECTOMY Bilateral     Prior to Admission medications   Medication Sig Start Date End Date Taking? Authorizing Provider  Aspirin (ASPIR-81 PO) Take 1 tablet by mouth 2 (two) times daily.    [provider]  cholecalciferol (VITAMIN D) 1000 units tablet Take 1,000 Units by mouth daily.    [provider]  Coenzyme Q10 (CO Q 10) 100 MG CAPS Take 1 capsule by mouth daily.    [provider]  DULoxetine (CYMBALTA) 20 MG capsule Take 20 mg by mouth at bedtime.    [provider]  enalapril (VASOTEC) 20 MG tablet Take 20  mg by mouth 2 (two) times daily.    [provider]  fluticasone (FLONASE) 50 MCG/ACT nasal spray Place 1 spray into both nostrils daily as needed for allergies or rhinitis.    [provider]  hydrochlorothiazide (HYDRODIURIL) 25 MG tablet Take 25 mg by mouth daily.    [provider]  levothyroxine  (SYNTHROID, LEVOTHROID) 75 MCG tablet Take 75 mcg by mouth daily before breakfast.    [provider]  metoprolol (LOPRESSOR) 100 MG tablet Take 100 mg by mouth 2 (two) times daily.    [provider]  omeprazole (PRILOSEC) 20 MG capsule Take 20 mg by mouth every other day.    [provider]  simvastatin (ZOCOR) 20 MG tablet Take 20 mg by mouth at bedtime.    [provider]  vitamin B-12 (CYANOCOBALAMIN) 1000 MCG tablet Take 1,000 mcg by mouth daily.    [provider]    Allergies Patient has no known allergies.  Family History  Problem Relation Age of Onset  . Breast cancer Paternal Aunt 43  . Breast cancer Cousin 33       mat cousin    Social History Social History   Tobacco Use  . Smoking status: Never Smoker  . Smokeless tobacco: Never Used  Substance Use Topics  . Alcohol use: Never    Frequency: Never  . Drug use: Never    Review of Systems Constitutional: No fever/chills Eyes: No visual changes. ENT: No sore throat. Cardiovascular: Denies chest pain. Respiratory: Denies shortness of breath. Gastrointestinal: Positive for abdominal pain.  Positive for nausea, no vomiting.  No diarrhea.  No constipation. Genitourinary: Negative for dysuria. Musculoskeletal: Negative for back pain. Skin: Negative for rash. Neurological: Negative for headaches, focal weakness or numbness.   ____________________________________________   PHYSICAL EXAM:  VITAL SIGNS: ED Triage Vitals  Enc Vitals Group     BP 08/16/17 1727 (!) 204/99     Pulse Rate 08/16/17 1727 84     Resp 08/16/17 1727 14     Temp 08/16/17 1727 98.1 F (36.7 C)     Temp Source 08/16/17 1727 Oral     SpO2 08/16/17 1727 94 %     Weight 08/16/17 1728 273 lb (123.8 kg)     Height 08/16/17 1728 5\' 5"  (1.651 m)     Head Circumference --      Peak Flow --      Pain Score 08/16/17 1727 7     Pain Loc --      Pain Edu? --      Excl. in [place]? --      Constitutional: Alert and oriented x4 pleasant cooperative somewhat uncomfortable appearing nontoxic no diaphoresis speaks full clear sentences Eyes: PERRL EOMI. Head: Atraumatic. Nose: No congestion/rhinnorhea. Mouth/Throat: No trismus Neck: No stridor.   Cardiovascular: Normal rate, regular rhythm. Grossly normal heart sounds.  Good peripheral circulation. Respiratory: Normal respiratory effort.  No retractions. Lungs CTAB and moving good air Gastrointestinal: Obese abdomen soft she is tender left lower quadrant with no frank peritonitis Musculoskeletal: No lower extremity edema   Neurologic:  Normal speech and language. No gross focal neurologic deficits are appreciated. Skin:  Skin is warm, dry and intact. No rash noted. Psychiatric: Mood and affect are normal. Speech and behavior are normal.    ____________________________________________   DIFFERENTIAL includes but not limited to  Large bowel obstruction, small bowel obstruction, volvulus, diverticulitis, constipation ____________________________________________   LABS (all labs ordered are listed, but only abnormal  results are displayed)  Labs Reviewed  COMPREHENSIVE METABOLIC PANEL - Abnormal; Notable for the following components:      Result Value   Glucose, Bld 115 (*)    Creatinine, Ser 1.03 (*)    AST 42 (*)    GFR calc non Af Amer 51 (*)    GFR calc Af Amer 60 (*)    All other components within normal limits  CBC - Abnormal; Notable for the following components:   WBC 13.5 (*)    RDW 15.2 (*)    All other components within normal limits  LIPASE, BLOOD  LACTIC ACID, PLASMA    Lab work reviewed by me with slightly elevated white count which is nonspecific __________________________________________  EKG   ____________________________________________  RADIOLOGY  Outpatient CT scan reviewed by me shows no bowel obstruction but is consistent with possible colitis but certainly significant  constipation ____________________________________________   PROCEDURES  Procedure(s) performed: no  Procedures  Critical Care performed: no  Observation: no ____________________________________________   INITIAL IMPRESSION / ASSESSMENT AND PLAN / ED COURSE  Pertinent labs & imaging results that were available during my care of the patient were reviewed by me and considered in my medical decision making (see chart for details).  The patient is slightly hypertensive however overall quite well-appearing.  Her last bowel movement was this morning and she is currently passing flatus.  She is not clinically obstructed.  I have personally reviewed the patient's CT scan and she has no evidence of obstruction.  She is however significantly constipated.  I discussed this with the patient and while she does not report constipation this is not atypical.  Given an enema and mag citrate here and have encouraged her to use MiraLAX magnesium citrate and enemas at home and to follow-up in 2 days for recheck.  Strict return precautions were given and the patient verbalizes understanding and agreement with the plan.      ____________________________________________   FINAL CLINICAL IMPRESSION(S) / ED DIAGNOSES  Final diagnoses:  Constipation, unspecified constipation type  Colitis      NEW MEDICATIONS STARTED DURING THIS VISIT:  Discharge Medication List as of 08/16/2017  6:54 PM       Note:  This document was prepared using Dragon voice recognition software and may include unintentional dictation errors.     Darel Hong, MD 08/17/17 1342

## 2017-08-16 NOTE — ED Triage Notes (Signed)
Pt to ED from home c/o LLQ pain x2 weeks getting worse, states last BM was today but real runny, not passing gas.  Had CT scan today and called with results from PCP that showed patient bowel obstruction.  Denies hx of same.

## 2017-08-16 NOTE — Discharge Instructions (Addendum)
Please begin using MiraLAX twice a day along with magnesium citrate once a day and a fleets enema once a day to help your constipation.  Follow-up with your primary care physician this coming Monday for recheck.  It was a pleasure to take care of you today, and thank you for coming to our emergency department.  If you have any questions or concerns before leaving please ask the nurse to grab me and I'm more than happy to go through your aftercare instructions again.  If you were prescribed any opioid pain medication today such as Norco, Vicodin, Percocet, morphine, hydrocodone, or oxycodone please make sure you do not drive when you are taking this medication as it can alter your ability to drive safely.  If you have any concerns once you are home that you are not improving or are in fact getting worse before you can make it to your follow-up appointment, please do not hesitate to call 911 and come back for further evaluation.  Darel Hong, MD  Results for orders placed or performed during the hospital encounter of 08/16/17  Lipase, blood  Result Value Ref Range   Lipase 27 11 - 51 U/L  Comprehensive metabolic panel  Result Value Ref Range   Sodium 137 135 - 145 mmol/L   Potassium 3.5 3.5 - 5.1 mmol/L   Chloride 101 101 - 111 mmol/L   CO2 25 22 - 32 mmol/L   Glucose, Bld 115 (H) 65 - 99 mg/dL   BUN 20 6 - 20 mg/dL   Creatinine, Ser 1.03 (H) 0.44 - 1.00 mg/dL   Calcium 9.1 8.9 - 10.3 mg/dL   Total Protein 7.7 6.5 - 8.1 g/dL   Albumin 4.0 3.5 - 5.0 g/dL   AST 42 (H) 15 - 41 U/L   ALT 32 14 - 54 U/L   Alkaline Phosphatase 61 38 - 126 U/L   Total Bilirubin 0.9 0.3 - 1.2 mg/dL   GFR calc non Af Amer 51 (L) >60 mL/min   GFR calc Af Amer 60 (L) >60 mL/min   Anion gap 11 5 - 15  CBC  Result Value Ref Range   WBC 13.5 (H) 3.6 - 11.0 K/uL   RBC 4.60 3.80 - 5.20 MIL/uL   Hemoglobin 13.1 12.0 - 16.0 g/dL   HCT 39.6 35.0 - 47.0 %   MCV 86.1 80.0 - 100.0 fL   MCH 28.4 26.0 - 34.0 pg   MCHC 33.0 32.0 - 36.0 g/dL   RDW 15.2 (H) 11.5 - 14.5 %   Platelets 326 150 - 440 K/uL  Lactic acid, plasma  Result Value Ref Range   Lactic Acid, Venous 1.4 0.5 - 1.9 mmol/L

## 2017-08-21 ENCOUNTER — Encounter: Payer: Self-pay | Admitting: Internal Medicine

## 2017-08-21 ENCOUNTER — Inpatient Hospital Stay: Payer: Medicare HMO

## 2017-08-21 ENCOUNTER — Inpatient Hospital Stay
Admission: AD | Admit: 2017-08-21 | Discharge: 2017-10-03 | DRG: 344 | Disposition: E | Payer: Medicare HMO | Attending: Specialist | Admitting: Specialist

## 2017-08-21 ENCOUNTER — Other Ambulatory Visit: Payer: Self-pay

## 2017-08-21 DIAGNOSIS — E872 Acidosis: Secondary | ICD-10-CM | POA: Diagnosis not present

## 2017-08-21 DIAGNOSIS — Z66 Do not resuscitate: Secondary | ICD-10-CM | POA: Diagnosis not present

## 2017-08-21 DIAGNOSIS — E785 Hyperlipidemia, unspecified: Secondary | ICD-10-CM | POA: Diagnosis present

## 2017-08-21 DIAGNOSIS — R062 Wheezing: Secondary | ICD-10-CM | POA: Diagnosis not present

## 2017-08-21 DIAGNOSIS — Y838 Other surgical procedures as the cause of abnormal reaction of the patient, or of later complication, without mention of misadventure at the time of the procedure: Secondary | ICD-10-CM | POA: Diagnosis not present

## 2017-08-21 DIAGNOSIS — C786 Secondary malignant neoplasm of retroperitoneum and peritoneum: Secondary | ICD-10-CM | POA: Diagnosis present

## 2017-08-21 DIAGNOSIS — K56609 Unspecified intestinal obstruction, unspecified as to partial versus complete obstruction: Secondary | ICD-10-CM

## 2017-08-21 DIAGNOSIS — E878 Other disorders of electrolyte and fluid balance, not elsewhere classified: Secondary | ICD-10-CM | POA: Diagnosis not present

## 2017-08-21 DIAGNOSIS — E876 Hypokalemia: Secondary | ICD-10-CM | POA: Diagnosis present

## 2017-08-21 DIAGNOSIS — R109 Unspecified abdominal pain: Secondary | ICD-10-CM

## 2017-08-21 DIAGNOSIS — Z8543 Personal history of malignant neoplasm of ovary: Secondary | ICD-10-CM

## 2017-08-21 DIAGNOSIS — C799 Secondary malignant neoplasm of unspecified site: Secondary | ICD-10-CM | POA: Diagnosis not present

## 2017-08-21 DIAGNOSIS — Z515 Encounter for palliative care: Secondary | ICD-10-CM | POA: Diagnosis not present

## 2017-08-21 DIAGNOSIS — R410 Disorientation, unspecified: Secondary | ICD-10-CM | POA: Diagnosis not present

## 2017-08-21 DIAGNOSIS — E7800 Pure hypercholesterolemia, unspecified: Secondary | ICD-10-CM | POA: Diagnosis present

## 2017-08-21 DIAGNOSIS — T8149XA Infection following a procedure, other surgical site, initial encounter: Secondary | ICD-10-CM | POA: Diagnosis not present

## 2017-08-21 DIAGNOSIS — R1114 Bilious vomiting: Secondary | ICD-10-CM | POA: Diagnosis not present

## 2017-08-21 DIAGNOSIS — K5730 Diverticulosis of large intestine without perforation or abscess without bleeding: Secondary | ICD-10-CM | POA: Diagnosis present

## 2017-08-21 DIAGNOSIS — Z7982 Long term (current) use of aspirin: Secondary | ICD-10-CM

## 2017-08-21 DIAGNOSIS — A419 Sepsis, unspecified organism: Secondary | ICD-10-CM | POA: Diagnosis not present

## 2017-08-21 DIAGNOSIS — E039 Hypothyroidism, unspecified: Secondary | ICD-10-CM | POA: Diagnosis present

## 2017-08-21 DIAGNOSIS — C762 Malignant neoplasm of abdomen: Secondary | ICD-10-CM

## 2017-08-21 DIAGNOSIS — I10 Essential (primary) hypertension: Secondary | ICD-10-CM | POA: Diagnosis not present

## 2017-08-21 DIAGNOSIS — K219 Gastro-esophageal reflux disease without esophagitis: Secondary | ICD-10-CM | POA: Diagnosis present

## 2017-08-21 DIAGNOSIS — Z7189 Other specified counseling: Secondary | ICD-10-CM | POA: Diagnosis not present

## 2017-08-21 DIAGNOSIS — K640 First degree hemorrhoids: Secondary | ICD-10-CM | POA: Diagnosis present

## 2017-08-21 DIAGNOSIS — E860 Dehydration: Secondary | ICD-10-CM | POA: Diagnosis present

## 2017-08-21 DIAGNOSIS — C482 Malignant neoplasm of peritoneum, unspecified: Secondary | ICD-10-CM | POA: Diagnosis not present

## 2017-08-21 DIAGNOSIS — K5900 Constipation, unspecified: Secondary | ICD-10-CM | POA: Diagnosis not present

## 2017-08-21 DIAGNOSIS — Z79890 Hormone replacement therapy (postmenopausal): Secondary | ICD-10-CM

## 2017-08-21 DIAGNOSIS — Z803 Family history of malignant neoplasm of breast: Secondary | ICD-10-CM

## 2017-08-21 DIAGNOSIS — N179 Acute kidney failure, unspecified: Secondary | ICD-10-CM | POA: Diagnosis not present

## 2017-08-21 DIAGNOSIS — D62 Acute posthemorrhagic anemia: Secondary | ICD-10-CM | POA: Diagnosis not present

## 2017-08-21 DIAGNOSIS — K632 Fistula of intestine: Secondary | ICD-10-CM | POA: Diagnosis not present

## 2017-08-21 DIAGNOSIS — G4730 Sleep apnea, unspecified: Secondary | ICD-10-CM | POA: Diagnosis present

## 2017-08-21 DIAGNOSIS — C801 Malignant (primary) neoplasm, unspecified: Secondary | ICD-10-CM

## 2017-08-21 DIAGNOSIS — L03311 Cellulitis of abdominal wall: Secondary | ICD-10-CM | POA: Diagnosis not present

## 2017-08-21 DIAGNOSIS — G92 Toxic encephalopathy: Secondary | ICD-10-CM | POA: Diagnosis not present

## 2017-08-21 DIAGNOSIS — J690 Pneumonitis due to inhalation of food and vomit: Secondary | ICD-10-CM | POA: Diagnosis not present

## 2017-08-21 DIAGNOSIS — F419 Anxiety disorder, unspecified: Secondary | ICD-10-CM | POA: Diagnosis not present

## 2017-08-21 DIAGNOSIS — Z4659 Encounter for fitting and adjustment of other gastrointestinal appliance and device: Secondary | ICD-10-CM

## 2017-08-21 DIAGNOSIS — R1084 Generalized abdominal pain: Secondary | ICD-10-CM | POA: Diagnosis present

## 2017-08-21 DIAGNOSIS — C3490 Malignant neoplasm of unspecified part of unspecified bronchus or lung: Secondary | ICD-10-CM | POA: Diagnosis not present

## 2017-08-21 DIAGNOSIS — J95821 Acute postprocedural respiratory failure: Secondary | ICD-10-CM | POA: Diagnosis not present

## 2017-08-21 DIAGNOSIS — E538 Deficiency of other specified B group vitamins: Secondary | ICD-10-CM | POA: Diagnosis present

## 2017-08-21 DIAGNOSIS — J9601 Acute respiratory failure with hypoxia: Secondary | ICD-10-CM | POA: Diagnosis not present

## 2017-08-21 DIAGNOSIS — K5732 Diverticulitis of large intestine without perforation or abscess without bleeding: Secondary | ICD-10-CM | POA: Diagnosis present

## 2017-08-21 DIAGNOSIS — C787 Secondary malignant neoplasm of liver and intrahepatic bile duct: Secondary | ICD-10-CM | POA: Diagnosis not present

## 2017-08-21 DIAGNOSIS — Z79899 Other long term (current) drug therapy: Secondary | ICD-10-CM

## 2017-08-21 DIAGNOSIS — Z95818 Presence of other cardiac implants and grafts: Secondary | ICD-10-CM

## 2017-08-21 DIAGNOSIS — Z9889 Other specified postprocedural states: Secondary | ICD-10-CM | POA: Diagnosis not present

## 2017-08-21 DIAGNOSIS — K567 Ileus, unspecified: Secondary | ICD-10-CM | POA: Diagnosis present

## 2017-08-21 DIAGNOSIS — Z90722 Acquired absence of ovaries, bilateral: Secondary | ICD-10-CM

## 2017-08-21 DIAGNOSIS — Z6841 Body Mass Index (BMI) 40.0 and over, adult: Secondary | ICD-10-CM | POA: Diagnosis not present

## 2017-08-21 DIAGNOSIS — M4800 Spinal stenosis, site unspecified: Secondary | ICD-10-CM | POA: Diagnosis present

## 2017-08-21 DIAGNOSIS — E079 Disorder of thyroid, unspecified: Secondary | ICD-10-CM | POA: Diagnosis not present

## 2017-08-21 HISTORY — DX: Malignant (primary) neoplasm, unspecified: C80.1

## 2017-08-21 LAB — COMPREHENSIVE METABOLIC PANEL
ALK PHOS: 58 U/L (ref 38–126)
ALT: 30 U/L (ref 14–54)
AST: 48 U/L — AB (ref 15–41)
Albumin: 3.7 g/dL (ref 3.5–5.0)
Anion gap: 11 (ref 5–15)
BILIRUBIN TOTAL: 0.6 mg/dL (ref 0.3–1.2)
BUN: 27 mg/dL — AB (ref 6–20)
CALCIUM: 9.1 mg/dL (ref 8.9–10.3)
CHLORIDE: 105 mmol/L (ref 101–111)
CO2: 24 mmol/L (ref 22–32)
CREATININE: 1.18 mg/dL — AB (ref 0.44–1.00)
GFR, EST AFRICAN AMERICAN: 51 mL/min — AB (ref >60)
GFR, EST NON AFRICAN AMERICAN: 44 mL/min — AB (ref >60)
Glucose, Bld: 114 mg/dL — ABNORMAL HIGH (ref 65–99)
Potassium: 2.6 mmol/L — CL (ref 3.5–5.1)
Sodium: 140 mmol/L (ref 135–145)
TOTAL PROTEIN: 7.3 g/dL (ref 6.5–8.1)

## 2017-08-21 LAB — CBC
HEMATOCRIT: 38.6 % (ref 35.0–47.0)
HEMOGLOBIN: 12.8 g/dL (ref 12.0–16.0)
MCH: 28.9 pg (ref 26.0–34.0)
MCHC: 33.1 g/dL (ref 32.0–36.0)
MCV: 87.2 fL (ref 80.0–100.0)
Platelets: 363 10*3/uL (ref 150–440)
RBC: 4.43 MIL/uL (ref 3.80–5.20)
RDW: 14.8 % — ABNORMAL HIGH (ref 11.5–14.5)
WBC: 12.6 10*3/uL — AB (ref 3.6–11.0)

## 2017-08-21 LAB — PROTIME-INR
INR: 1.05
PROTHROMBIN TIME: 13.6 s (ref 11.4–15.2)

## 2017-08-21 LAB — MAGNESIUM: Magnesium: 2.1 mg/dL (ref 1.7–2.4)

## 2017-08-21 MED ORDER — ONDANSETRON HCL 4 MG/2ML IJ SOLN
4.0000 mg | Freq: Four times a day (QID) | INTRAMUSCULAR | Status: DC | PRN
  Administered 2017-08-21 – 2017-08-27 (×3): 4 mg via INTRAVENOUS
  Filled 2017-08-21 (×4): qty 2

## 2017-08-21 MED ORDER — BISACODYL 10 MG RE SUPP: 10.0000 mg | Freq: Every day | RECTAL | Status: DC | PRN

## 2017-08-21 MED ORDER — ASPIRIN EC 81 MG PO TBEC
81.0000 mg | DELAYED_RELEASE_TABLET | Freq: Every day | ORAL | Status: DC
  Administered 2017-08-22 – 2017-09-07 (×10): 81 mg via ORAL
  Filled 2017-08-21 (×11): qty 1

## 2017-08-21 MED ORDER — FAMOTIDINE IN NACL 20-0.9 MG/50ML-% IV SOLN
20.0000 mg | INTRAVENOUS | Status: DC
  Administered 2017-08-21 – 2017-08-28 (×8): 20 mg via INTRAVENOUS
  Filled 2017-08-21 (×8): qty 50

## 2017-08-21 MED ORDER — FLUTICASONE PROPIONATE 50 MCG/ACT NA SUSP
1.0000 | Freq: Every day | NASAL | Status: DC | PRN
Start: 2017-08-21 — End: 2017-09-08
  Filled 2017-08-21: qty 16

## 2017-08-21 MED ORDER — ASPIRIN EC 81 MG PO TBEC: 81.0000 mg | DELAYED_RELEASE_TABLET | Freq: Two times a day (BID) | ORAL | Status: DC

## 2017-08-21 MED ORDER — PANTOPRAZOLE SODIUM 40 MG PO TBEC
40.0000 mg | DELAYED_RELEASE_TABLET | Freq: Every day | ORAL | Status: DC
Start: 2017-08-21 — End: 2017-08-21

## 2017-08-21 MED ORDER — ENALAPRIL MALEATE 20 MG PO TABS
20.0000 mg | ORAL_TABLET | Freq: Two times a day (BID) | ORAL | Status: DC
  Filled 2017-08-21: qty 1

## 2017-08-21 MED ORDER — HYDRALAZINE HCL 20 MG/ML IJ SOLN
10.0000 mg | INTRAMUSCULAR | Status: DC | PRN
  Administered 2017-08-21 – 2017-09-04 (×5): 10 mg via INTRAVENOUS
  Filled 2017-08-21 (×6): qty 1

## 2017-08-21 MED ORDER — SENNOSIDES-DOCUSATE SODIUM 8.6-50 MG PO TABS: 1.0000 | ORAL_TABLET | Freq: Every evening | ORAL | Status: DC | PRN

## 2017-08-21 MED ORDER — DULOXETINE HCL 20 MG PO CPEP
20.0000 mg | ORAL_CAPSULE | Freq: Every day | ORAL | Status: DC
  Filled 2017-08-21: qty 1

## 2017-08-21 MED ORDER — VITAMIN B-12 1000 MCG PO TABS: 1000.0000 ug | ORAL_TABLET | Freq: Every day | ORAL | Status: DC

## 2017-08-21 MED ORDER — MORPHINE SULFATE (PF) 2 MG/ML IV SOLN
2.0000 mg | INTRAVENOUS | Status: DC | PRN
  Administered 2017-08-22 – 2017-08-28 (×19): 2 mg via INTRAVENOUS
  Filled 2017-08-21 (×20): qty 1

## 2017-08-21 MED ORDER — LEVOTHYROXINE SODIUM 50 MCG PO TABS
75.0000 ug | ORAL_TABLET | Freq: Every day | ORAL | Status: DC
Start: 2017-08-22 — End: 2017-09-07
  Administered 2017-08-24 – 2017-09-06 (×9): 75 ug via ORAL
  Filled 2017-08-21 (×13): qty 2

## 2017-08-21 MED ORDER — SERTRALINE HCL 50 MG PO TABS
100.0000 mg | ORAL_TABLET | Freq: Every day | ORAL | Status: DC
  Administered 2017-08-22 – 2017-09-06 (×11): 100 mg via ORAL
  Filled 2017-08-21 (×13): qty 2

## 2017-08-21 MED ORDER — METOPROLOL TARTRATE 50 MG PO TABS
100.0000 mg | ORAL_TABLET | Freq: Two times a day (BID) | ORAL | Status: DC
  Administered 2017-08-22 – 2017-09-06 (×23): 100 mg via ORAL
  Filled 2017-08-21 (×25): qty 2

## 2017-08-21 MED ORDER — ACETAMINOPHEN 650 MG RE SUPP: 650.0000 mg | Freq: Four times a day (QID) | RECTAL | Status: DC | PRN

## 2017-08-21 MED ORDER — POTASSIUM CHLORIDE 10 MEQ/100ML IV SOLN
10.0000 meq | INTRAVENOUS | Status: AC
  Administered 2017-08-21 – 2017-08-22 (×5): 10 meq via INTRAVENOUS
  Filled 2017-08-21: qty 100

## 2017-08-21 MED ORDER — METOPROLOL TARTRATE 50 MG PO TABS: 100.0000 mg | ORAL_TABLET | Freq: Two times a day (BID) | ORAL | Status: DC

## 2017-08-21 MED ORDER — SODIUM CHLORIDE 0.9 % IV SOLN
INTRAVENOUS | Status: DC
  Administered 2017-08-21 – 2017-08-24 (×4): via INTRAVENOUS

## 2017-08-21 MED ORDER — SIMVASTATIN 20 MG PO TABS
20.0000 mg | ORAL_TABLET | Freq: Every day | ORAL | Status: DC
  Filled 2017-08-21: qty 1

## 2017-08-21 MED ORDER — LEVOTHYROXINE SODIUM 50 MCG PO TABS: 75.0000 ug | ORAL_TABLET | Freq: Every day | ORAL | Status: DC

## 2017-08-21 MED ORDER — ENOXAPARIN SODIUM 40 MG/0.4ML ~~LOC~~ SOLN
40.0000 mg | Freq: Two times a day (BID) | SUBCUTANEOUS | Status: DC
  Administered 2017-08-21 – 2017-08-22 (×2): 40 mg via SUBCUTANEOUS
  Filled 2017-08-21 (×2): qty 0.4

## 2017-08-21 MED ORDER — ACETAMINOPHEN 325 MG PO TABS
650.0000 mg | ORAL_TABLET | Freq: Four times a day (QID) | ORAL | Status: DC | PRN
  Administered 2017-08-22 – 2017-08-25 (×2): 650 mg via ORAL
  Filled 2017-08-21 (×2): qty 2

## 2017-08-21 MED ORDER — ONDANSETRON HCL 4 MG PO TABS: 4.0000 mg | ORAL_TABLET | Freq: Four times a day (QID) | ORAL | Status: DC | PRN

## 2017-08-21 MED ORDER — ATORVASTATIN CALCIUM 20 MG PO TABS
80.0000 mg | ORAL_TABLET | Freq: Every day | ORAL | Status: DC
  Administered 2017-08-22 – 2017-09-04 (×9): 80 mg via ORAL
  Filled 2017-08-21 (×11): qty 4

## 2017-08-21 NOTE — Progress Notes (Signed)
Lab called patient Potassium is 2.6. Dr. Hulen Skains new orders given, will continue to monitor.

## 2017-08-21 NOTE — Progress Notes (Signed)
Pharmacy consulted for electrolyte replacement protocol:   Goal of therapy: Electrolytes within normal limits:  K 3.5 - 5.1 Corrected Ca 8.9 - 10.3 Phos 2.5 - 4.6 Mg 1.7 - 2.4   Assessment: Lab Results  Component Value Date   CREATININE 1.18 (H) 08/13/2017   BUN 27 (H) 08/24/2017   NA 140 08/18/2017   K 2.6 (LL) 08/27/2017   CL 105 08/19/2017   CO2 24 08/14/2017    Plan: K 2.6, ordering KCl 7meq iv q1h x 5 doses. Will reheck with AM labs. Will order Magnesium now and replace as necessary for hypomagnesia. Will order am labs per protocol.  Thomasenia Sales, PharmD, MBA, [facility]

## 2017-08-21 NOTE — H&P (Addendum)
[place] [facility] [place] NAME: Alicia Conley    MR#:  [id number]  DATE OF BIRTH:  04/22/1940  DATE OF ADMISSION:  08/27/2017  PRIMARY CARE PHYSICIAN: [name], MD   REQUESTING/REFERRING PHYSICIAN:   CHIEF COMPLAINT:  Abdominal pain  HISTORY OF PRESENT ILLNESS: Alicia Conley  is a 77 y.o. female with a known history of hyperlipidemia, hypertension, hypothyroidism presented to the emergency room with abdominal pain.  The abdominal pain is going on for the last 2-1/2 weeks abdominal pain started 2 end of weeks ago and patient had been worked up with [facility] hospital with a CT abdomen which revealed partial distal colonic obstruction.  The colon was filled up with stool to.  She was seen at our ER on Saturday and was evaluated and discharged back to home.  Patient still has abdominal discomfort and unable to eat and drink any fluids and and saw gastroenterologist Dr. Alice Reichert in the clinic today.  Dr Alice Reichert referred the patient for direct admission to the hospital.  No abdominal pain is generalized 4 out of 10 on a scale of 1-10.  Last bowel movement more than 1 week ago.  PAST MEDICAL HISTORY:   Past Medical History:  Diagnosis Date  . High cholesterol   . Hypertension   . Thyroid disease     PAST SURGICAL HISTORY:  Past Surgical History:  Procedure Laterality Date  . BREAST BIOPSY Left 11/07/12   u/s bx lt -neg  . BREAST CYST ASPIRATION Left    neg  . EP IMPLANTABLE DEVICE N/A 04/03/2016   Procedure: Loop Recorder Insertion;  Surgeon: Isaias Cowman, MD;  Location: [place] [facility];  Service: Cardiovascular;  Laterality: N/A;  . HERNIA REPAIR    . OOPHORECTOMY Bilateral     SOCIAL HISTORY:  Social History   Tobacco Use  . Smoking status: Never Smoker  . Smokeless tobacco: Never Used  Substance Use Topics  . Alcohol use: Never    Frequency: Never    FAMILY HISTORY:  Family History  Problem Relation Age  of Onset  . Breast cancer Paternal Aunt 70  . Breast cancer Cousin 75       mat cousin    DRUG ALLERGIES: No Known Allergies  REVIEW OF SYSTEMS:   CONSTITUTIONAL: No fever, fatigue or weakness.  EYES: No blurred or double vision.  EARS, NOSE, AND THROAT: No tinnitus or ear pain.  RESPIRATORY: No cough, shortness of breath, wheezing or hemoptysis.  CARDIOVASCULAR: No chest pain, orthopnea, edema.  GASTROINTESTINAL: Has nausea,  No vomiting, diarrhea  Has abdominal pain.  GENITOURINARY: No dysuria, hematuria.  ENDOCRINE: No polyuria, nocturia,  HEMATOLOGY: No anemia, easy bruising or bleeding SKIN: No rash or lesion. MUSCULOSKELETAL: No joint pain or arthritis.   NEUROLOGIC: No tingling, numbness, weakness.  PSYCHIATRY: No anxiety or depression.   MEDICATIONS AT HOME:  Prior to Admission medications   Medication Sig Start Date End Date Taking? Authorizing Provider  Aspirin (ASPIR-81 PO) Take 1 tablet by mouth 2 (two) times daily.    [provider]  cholecalciferol (VITAMIN D) 1000 units tablet Take 1,000 Units by mouth daily.    [provider]  Coenzyme Q10 (CO Q 10) 100 MG CAPS Take 1 capsule by mouth daily.    [provider]  DULoxetine (CYMBALTA) 20 MG capsule Take 20 mg by mouth at bedtime.    [provider]  enalapril (VASOTEC) 20 MG tablet Take 20 mg by  mouth 2 (two) times daily.    [provider]  fluticasone (FLONASE) 50 MCG/ACT nasal spray Place 1 spray into both nostrils daily as needed for allergies or rhinitis.    [provider]  hydrochlorothiazide (HYDRODIURIL) 25 MG tablet Take 25 mg by mouth daily.    [provider]  levothyroxine (SYNTHROID, LEVOTHROID) 75 MCG tablet Take 75 mcg by mouth daily before breakfast.    [provider]  metoprolol (LOPRESSOR) 100 MG tablet Take 100 mg by mouth 2 (two) times daily.    [provider]  omeprazole (PRILOSEC) 20 MG capsule Take 20 mg by  mouth every other day.    [provider]  simvastatin (ZOCOR) 20 MG tablet Take 20 mg by mouth at bedtime.    [provider]  vitamin B-12 (CYANOCOBALAMIN) 1000 MCG tablet Take 1,000 mcg by mouth daily.    [provider]      PHYSICAL EXAMINATION:   VITAL SIGNS: Blood pressure (!) 198/77, pulse 61, temperature 97.7 F (36.5 C), temperature source Oral, resp. rate 14, SpO2 99 %.  GENERAL:  77 y.o.-year-old patient lying in the bed with no acute distress.  EYES: Pupils equal, round, reactive to light and accommodation. No scleral icterus. Extraocular muscles intact.  HEENT: Head atraumatic, normocephalic. Oropharynx and nasopharynx clear.  NECK:  Supple, no jugular venous distention. No thyroid enlargement, no tenderness.  LUNGS: Normal breath sounds bilaterally, no wheezing, rales,rhonchi or crepitation. No use of accessory muscles of respiration.  CARDIOVASCULAR: S1, S2 normal. No murmurs, rubs, or gallops.  ABDOMEN: Soft, mild tenderness around the umbilicus, nondistended. Bowel sounds present. No organomegaly or mass.  EXTREMITIES: No pedal edema, cyanosis, or clubbing.  NEUROLOGIC: Cranial nerves II through XII are intact. Muscle strength 5/5 in all extremities. Sensation intact. Gait not checked.  PSYCHIATRIC: The patient is alert and oriented x 3.  SKIN: No obvious rash, lesion, or ulcer.   LABORATORY PANEL:   CBC Recent Labs  Lab 08/16/17 1741  WBC 13.5*  HGB 13.1  HCT 39.6  PLT 326  MCV 86.1  MCH 28.4  MCHC 33.0  RDW 15.2*   ------------------------------------------------------------------------------------------------------------------  Chemistries  Recent Labs  Lab 08/16/17 1741  NA 137  K 3.5  CL 101  CO2 25  GLUCOSE 115*  BUN 20  CREATININE 1.03*  CALCIUM 9.1  AST 42*  ALT 32  ALKPHOS 61  BILITOT 0.9    ------------------------------------------------------------------------------------------------------------------ estimated creatinine clearance is 61.4 mL/min (A) (by C-G formula based on SCr of 1.03 mg/dL (H)). ------------------------------------------------------------------------------------------------------------------ No results for input(s): TSH, T4TOTAL, T3FREE, THYROIDAB in the last 72 hours.  Invalid input(s): FREET3   Coagulation profile No results for input(s): INR, PROTIME in the last 168 hours. ------------------------------------------------------------------------------------------------------------------- No results for input(s): DDIMER in the last 72 hours. -------------------------------------------------------------------------------------------------------------------  Cardiac Enzymes No results for input(s): CKMB, TROPONINI, MYOGLOBIN in the last 168 hours.  Invalid input(s): CK ------------------------------------------------------------------------------------------------------------------ Invalid input(s): POCBNP  ---------------------------------------------------------------------------------------------------------------  Urinalysis    Component Value Date/Time   COLORURINE Yellow 12/01/2013 1850   APPEARANCEUR Clear 12/01/2013 1850   LABSPEC 1.009 12/01/2013 1850   PHURINE 5.0 12/01/2013 1850   GLUCOSEU Negative 12/01/2013 1850   HGBUR Negative 12/01/2013 1850   BILIRUBINUR Negative 12/01/2013 1850   KETONESUR Negative 12/01/2013 1850   PROTEINUR Negative 12/01/2013 1850   NITRITE Negative 12/01/2013 1850   LEUKOCYTESUR Trace 12/01/2013 1850     RADIOLOGY: No results found.  EKG: No orders found for this or any previous visit.  IMPRESSION  AND PLAN:  77 year old female patient with history of hypertension, hyperlipidemia, hypothyroidism referred from GI clinic for direct admission for abdominal discomfort.  -Severe constipation ,  Rule out bowel obstruction Abdominal x-ray Laxatives and PRN edema  -Acute Hypokalemia Replace potassium IV  -Hypertension Resume metoprolol orally if no ileus  -Hyperlipidemia Resume statin orally if no ileus  -Dehydration IV fluids  -DVT prophylaxis with [place] lovenox  All the records are reviewed and case discussed with ED provider. Management plans discussed with the patient, family and they are in agreement.  CODE STATUS:Full code    Code Status Orders  (From admission, onward)        Start     Ordered   08/03/2017 1732  Full code  Continuous     08/13/2017 1731    Code Status History    This patient has a current code status but no historical code status.       TOTAL TIME TAKING CARE OF THIS PATIENT: 51 minutes.    Saundra Shelling M.D on 08/17/2017 at 6:09 PM  Between 7am to 6pm - Pager - [phone] After 6pm go to [email or web address] - password [email or web address] [facility]  [place] [facility]  Office  [phone]  CC: Primary care physician; [name], MD

## 2017-08-21 NOTE — Progress Notes (Signed)
Anticoagulation monitoring(Lovenox):  77 yo  female ordered Lovenox 40 mg Q24h  There were no vitals filed for this visit. BMI 45.43   Lab Results  Component Value Date   CREATININE 1.03 (H) 08/16/2017   CREATININE 1.48 (H) 12/03/2013   CREATININE 1.30 11/30/2013   Estimated Creatinine Clearance: 61.4 mL/min (A) (by C-G formula based on SCr of 1.03 mg/dL (H)). Hemoglobin & Hematocrit     Component Value Date/Time   HGB 13.1 08/16/2017 1741   HGB 10.7 (L) 12/03/2013 0451   HCT 39.6 08/16/2017 1741   HCT 33.4 (L) 12/03/2013 0451     Per Protocol for Patient with estCrcl > 30 ml/min and BMI > 40, will transition to Lovenox 40 mg Q12h.

## 2017-08-21 NOTE — Progress Notes (Signed)
Pharmacy consulted for electrolyte replacement protocol:   Goal of therapy: Electrolytes within normal limits:  K 3.5 - 5.1 Corrected Ca 8.9 - 10.3 Phos 2.5 - 4.6 Mg 1.7 - 2.4   Assessment: Lab Results  Component Value Date   CREATININE 1.18 (H) 08/05/2017   BUN 27 (H) 08/11/2017   NA 140 08/24/2017   K 2.6 (LL) 08/26/2017   CL 105 08/20/2017   CO2 24 08/07/2017    Plan: K 2.6, ordering KCl 2meq iv q1h x 5 doses. Will reheck with AM labs. Will order Magnesium now and replace as necessary for hypomagnesia. Will order am labs per protocol.  6/19  Mag @ 20:30 = 2.1 No additional mag needed, will recheck electrolytes on 6/20 with AM labs.   [facility]

## 2017-08-21 NOTE — Progress Notes (Signed)
Advanced care plan. Purpose of the Encounter: CODE STATUS Parties in Attendance: Patient Patient's Decision Capacity: Good Subjective/Patient's story: Presented to the hospital with abdominal pain  Objective/Medical story Has severe constipation Has to be worked up for ileus and obstruction  Goals of care determination:  Advance care directives and goals of care discussed with the patient For now patient and family wants everything done which includes cardiac resuscitation, intubation and ventilator if the need arises CODE STATUS: Full code Time spent discussing advanced care planning: 16 minutes

## 2017-08-22 ENCOUNTER — Encounter: Payer: Self-pay | Admitting: Radiology

## 2017-08-22 ENCOUNTER — Inpatient Hospital Stay: Payer: Medicare HMO

## 2017-08-22 ENCOUNTER — Telehealth: Payer: Self-pay

## 2017-08-22 DIAGNOSIS — R1084 Generalized abdominal pain: Secondary | ICD-10-CM

## 2017-08-22 LAB — GASTROINTESTINAL PANEL BY PCR, STOOL (REPLACES STOOL CULTURE)
ADENOVIRUS F40/41: NOT DETECTED
ASTROVIRUS: NOT DETECTED
CYCLOSPORA CAYETANENSIS: NOT DETECTED
Campylobacter species: NOT DETECTED
Cryptosporidium: NOT DETECTED
ENTAMOEBA HISTOLYTICA: NOT DETECTED
ENTEROAGGREGATIVE E COLI (EAEC): NOT DETECTED
ENTEROPATHOGENIC E COLI (EPEC): NOT DETECTED
ENTEROTOXIGENIC E COLI (ETEC): NOT DETECTED
GIARDIA LAMBLIA: NOT DETECTED
Norovirus GI/GII: NOT DETECTED
Plesimonas shigelloides: NOT DETECTED
Rotavirus A: NOT DETECTED
SAPOVIRUS (I, II, IV, AND V): NOT DETECTED
Salmonella species: NOT DETECTED
Shiga like toxin producing E coli (STEC): NOT DETECTED
Shigella/Enteroinvasive E coli (EIEC): NOT DETECTED
VIBRIO CHOLERAE: NOT DETECTED
VIBRIO SPECIES: NOT DETECTED
Yersinia enterocolitica: NOT DETECTED

## 2017-08-22 LAB — C DIFFICILE QUICK SCREEN W PCR REFLEX
C DIFFICILE (CDIFF) TOXIN: NEGATIVE
C Diff antigen: NEGATIVE
C Diff interpretation: NOT DETECTED

## 2017-08-22 LAB — BASIC METABOLIC PANEL
ANION GAP: 9 (ref 5–15)
BUN: 24 mg/dL — ABNORMAL HIGH (ref 6–20)
CALCIUM: 8.6 mg/dL — AB (ref 8.9–10.3)
CHLORIDE: 111 mmol/L (ref 101–111)
CO2: 23 mmol/L (ref 22–32)
Creatinine, Ser: 1.17 mg/dL — ABNORMAL HIGH (ref 0.44–1.00)
GFR calc Af Amer: 51 mL/min — ABNORMAL LOW (ref >60)
GFR calc non Af Amer: 44 mL/min — ABNORMAL LOW (ref >60)
GLUCOSE: 117 mg/dL — AB (ref 65–99)
Potassium: 3.2 mmol/L — ABNORMAL LOW (ref 3.5–5.1)
Sodium: 143 mmol/L (ref 135–145)

## 2017-08-22 LAB — LACTOFERRIN, FECAL, QUALITATIVE: LACTOFERRIN, FECAL, QUAL: POSITIVE — AB

## 2017-08-22 LAB — CBC
HCT: 38 % (ref 35.0–47.0)
HEMOGLOBIN: 12.8 g/dL (ref 12.0–16.0)
MCH: 29 pg (ref 26.0–34.0)
MCHC: 33.7 g/dL (ref 32.0–36.0)
MCV: 86.1 fL (ref 80.0–100.0)
Platelets: 348 10*3/uL (ref 150–440)
RBC: 4.42 MIL/uL (ref 3.80–5.20)
RDW: 15.2 % — ABNORMAL HIGH (ref 11.5–14.5)
WBC: 13.5 10*3/uL — ABNORMAL HIGH (ref 3.6–11.0)

## 2017-08-22 MED ORDER — POTASSIUM CHLORIDE 10 MEQ/100ML IV SOLN
10.0000 meq | INTRAVENOUS | Status: AC
  Administered 2017-08-22 (×3): 10 meq via INTRAVENOUS
  Filled 2017-08-22 (×3): qty 100

## 2017-08-22 MED ORDER — ENOXAPARIN SODIUM 40 MG/0.4ML ~~LOC~~ SOLN
40.0000 mg | SUBCUTANEOUS | Status: DC
  Administered 2017-08-23 – 2017-08-28 (×5): 40 mg via SUBCUTANEOUS
  Filled 2017-08-22 (×5): qty 0.4

## 2017-08-22 MED ORDER — IOPAMIDOL (ISOVUE-300) INJECTION 61%
15.0000 mL | INTRAVENOUS | Status: AC
  Administered 2017-08-22: 15 mL via ORAL

## 2017-08-22 MED ORDER — ONDANSETRON HCL 4 MG/2ML IJ SOLN
4.0000 mg | Freq: Once | INTRAMUSCULAR | Status: AC
  Administered 2017-08-22: 4 mg via INTRAVENOUS
  Filled 2017-08-22: qty 2

## 2017-08-22 MED ORDER — BOOST / RESOURCE BREEZE PO LIQD CUSTOM
1.0000 | Freq: Three times a day (TID) | ORAL | Status: DC
  Administered 2017-08-24 (×2): 1 via ORAL

## 2017-08-22 MED ORDER — IOPAMIDOL (ISOVUE-300) INJECTION 61%
80.0000 mL | Freq: Once | INTRAVENOUS | Status: AC | PRN
  Administered 2017-08-22: 80 mL via INTRAVENOUS

## 2017-08-22 NOTE — Progress Notes (Signed)
Initial Nutrition Assessment  DOCUMENTATION CODES:   Morbid obesity  INTERVENTION:   - Boost Breeze po TID, each supplement provides 250 kcal and 9 grams of protein  NUTRITION DIAGNOSIS:   Inadequate oral intake related to poor appetite, nausea, vomiting as evidenced by per patient/family report.  GOAL:   Patient will meet greater than or equal to 90% of their needs  MONITOR:   PO intake, Supplement acceptance, Diet advancement, Labs, Weight trends, I & O's  REASON FOR ASSESSMENT:   Malnutrition Screening Tool    ASSESSMENT:   77 year old female who was directly admitted to the hospital by GI clinic for abdominal discomfort. PMH significant for hyperlipidemia, hypertension, and hypothyroidism. KUB showed no evidence of free air and no evidence of large or small bowel obstruction.  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 Pt states that she has not been much during this time. A typical day of eating might include 1 piece of taste and chicken noodle soup. Pt reports that prior to feeling so poorly, pt ate 1 meal daily and drank water. Pt agreeable to trying Boost Breeze TID while on clear liquid diet to maximize protein intake.  Pt states she had a few sips of broth for lunch but that she is burping frequently now.  Pt reports her UBW as 286 lbs and that she last weighed this over 1 month ago. RD obtained bed weight at time of visit: 126.9 kg (279 lbs). Limited weight history in chart so unable to determine extent of weight loss.  Medications reviewed and include: 75 mcg levothyroxine daily, 20 mg IV Pepcid  Labs reviewed: BUN 24 (H), creatinine 1.17 (H), potassium 32.  (L)  NUTRITION - FOCUSED PHYSICAL EXAM:    Most Recent Value  Orbital Region  No depletion  Upper Arm Region  No depletion  Thoracic and Lumbar Region  No depletion  Buccal Region  No depletion  Temple Region  No depletion  Clavicle Bone Region  No depletion  Clavicle and Acromion Bone Region  No depletion  Scapular Bone Region  No depletion  Dorsal Hand  No depletion  Patellar Region  No depletion  Anterior Thigh Region  No depletion  Posterior Calf Region  No depletion  Edema (RD Assessment)  Mild [generalized]  Hair  Reviewed  Eyes  Reviewed  Mouth  Reviewed  Skin  Reviewed  Nails  Reviewed       Diet Order:   Diet Order           Diet clear liquid Room service appropriate? Yes; Fluid consistency: Thin  Diet effective now          EDUCATION NEEDS:   No education needs have been identified at this time  Skin:  Skin Assessment: Reviewed RN Assessment  Last BM:  08/22/17 medium type 7  Height:   Ht Readings from Last 1 Encounters:  08/16/17 5\' 5"  (1.651 m)    Weight:    Wt Readings from Last 1 Encounters:  08/22/17 279 lb 12.2 oz (126.9 kg)    Ideal Body Weight:  56.8 kg  BMI:  45.3 kg/m^2  Estimated Nutritional Needs:   Kcal:  1600-1800 kcal/day  Protein:  75-90 grams/day  Fluid:  1.6-1.8 L/day  Kate Jablonski Kelcey Korus, MS, RD, LDN Pager: [phone] Weekend/After Hours: [phone]  

## 2017-08-22 NOTE — Consult Note (Signed)
[facility] GI Inpatient Consult Note   Alicia Conley, M.D.  Reason for Consult: abdominal pain, possible colonic obstruction, diverticulitis   Attending Requesting Consult: Alicia Conley, M.D.  Outpatient Primary Physician:  Alicia Conley, M.D.  History of Present Illness: Alicia Conley is a 77 y.o. female seen by me yesterday in my office at the request of DR. Astrid Conley, her Primary physician, for progressive abdominal pain and distension. Alicia Conley is a pleasant 77 year old female with complaints of approximately 10 days of progressive abdominal distention, weight loss (16 pounds), obstipation and left lower quadrant abdominal pain. Last colonoscopy reportedly in 2005 and was "normal". Alicia Conley had a last EGD performed in 2015 at [facility] which was normal. Patient reports that Alicia Conley has been unable to eat in the last 2 days. Alicia Conley had one egg two days ago and apparently had vomited it up. Patient has no appetite currently. Patient was seen on 08/16/2017 at [facility] emergency department and underwent a CT scan showing severe fecal impaction in the sigmoid and descending colon. There was mild dilatation of the descending and transverse colon proximal to the area of obstruction. There was apparent diverticulosis of the sigmoid colon without diverticulitis. There was evidence of polycystic disease of both kidneys and liver. There was wall thickening in the sigmoid that was not clearly associated with the diverticula present. No mass was mentioned in the colon. There is mild leukocytosis with a white blood cell count of 14,000. The patient was treated for obstipation at the [facility] emergency room and told to go home and take enemas and MiraLAX. Alicia Conley was also given a prescription for ciprofloxacin and Flagyl for presumptive diverticulitis. Patient claims that neither of these remedies appeared to help relieve her symptoms and Alicia Conley is just having "watery discharge" from the  rectum. Alicia Conley is vomiting still despite no further po intake.  No obvious hematochezia. Patient denies any flatus.   Past Medical History:  Past Medical History:  Diagnosis Date  . High cholesterol   . Hypertension   . Thyroid disease     Problem List: Patient Active Problem List   Diagnosis Date Noted  . Abdominal pain 08/13/2017  . Ileus ([place]) 08/13/2017    Past Surgical History: Past Surgical History:  Procedure Laterality Date  . BREAST BIOPSY Left 11/07/12   u/s bx lt -neg  . BREAST CYST ASPIRATION Left    neg  . EP IMPLANTABLE DEVICE N/A 04/03/2016   Procedure: Loop Recorder Insertion;  Surgeon: Isaias Cowman, MD;  Location: [place] [facility];  Service: Cardiovascular;  Laterality: N/A;  . HERNIA REPAIR    . OOPHORECTOMY Bilateral     Allergies: No Known Allergies  Home Medications: Medications Prior to Admission  Medication Sig Dispense Refill Last Dose  . Aspirin (ASPIR-81 PO) Take 1 tablet by mouth 2 (two) times daily.   08/20/2017 at 2000  . atorvastatin (LIPITOR) 80 MG tablet Take 80 mg by mouth daily.   08/18/2017 at 0800  . Biotin 1 MG CAPS Take 1 mg by mouth 3 (three) times daily.   08/22/2017 at 0800  . cholecalciferol (VITAMIN D) 1000 units tablet Take 1,000 Units by mouth daily.   08/18/2017 at 0800  . Coenzyme Q10 (CO Q 10) 100 MG CAPS Take 1 capsule by mouth daily.   08/18/2017 at 0800  . cyclobenzaprine (FLEXERIL) 5 MG tablet Take 5 mg by mouth at bedtime as needed for muscle spasms.   PRN at PRN  .  enalapril (VASOTEC) 20 MG tablet Take 20 mg by mouth 2 (two) times daily.   08/17/2017 at 0800  . fluticasone (FLONASE) 50 MCG/ACT nasal spray Place 1 spray into both nostrils daily as needed for allergies or rhinitis.   PRN at PRN  . hydrochlorothiazide (HYDRODIURIL) 25 MG tablet Take 25 mg by mouth daily.   08/24/2017 at 0800  . levothyroxine (SYNTHROID, LEVOTHROID) 75 MCG tablet Take 75 mcg by mouth daily before breakfast.   08/22/2017 at 0800  . metoprolol  (LOPRESSOR) 100 MG tablet Take 100 mg by mouth 2 (two) times daily.   08/04/2017 at 0800  . omeprazole (PRILOSEC) 20 MG capsule Take 20 mg by mouth daily.    08/13/2017 at 0800  . sertraline (ZOLOFT) 100 MG tablet Take 100 mg by mouth daily.   08/24/2017 at 0800  . vitamin B-12 (CYANOCOBALAMIN) 1000 MCG tablet Take 1,000 mcg by mouth daily.   08/20/2017 at 0800   Home medication reconciliation was completed with the patient.   Scheduled Inpatient Medications:   . aspirin EC  81 mg Oral Daily  . atorvastatin  80 mg Oral Daily  . enoxaparin (LOVENOX) injection  40 mg Subcutaneous Q12H  . iopamidol  15 mL Oral Q1 Hr x 2  . levothyroxine  75 mcg Oral QAC breakfast  . metoprolol tartrate  100 mg Oral BID  . sertraline  100 mg Oral Daily    Continuous Inpatient Infusions:   . sodium chloride 100 mL/hr at 08/26/2017 1834  . famotidine (PEPCID) IV Stopped (08/13/2017 2042)    PRN Inpatient Medications:  acetaminophen **OR** acetaminophen, bisacodyl, fluticasone, hydrALAZINE, morphine injection, ondansetron **OR** ondansetron (ZOFRAN) IV  Family History: family history includes Breast cancer (age of onset: 33) in her cousin; Breast cancer (age of onset: 45) in her paternal aunt.   GI Family History: Negative  Social History:   reports that Alicia Conley has never smoked. Alicia Conley has never used smokeless tobacco. Alicia Conley reports that Alicia Conley does not drink alcohol or use drugs. The patient denies ETOH, tobacco, or drug use.    Review of Systems: Review of Systems - History obtained from the patient General ROS: positive for  - fatigue and weight loss Psychological ROS: negative Allergy and Immunology ROS: negative Hematological and Lymphatic ROS: negative Endocrine ROS: negative Respiratory ROS: no cough, shortness of breath, or wheezing Cardiovascular ROS: no chest pain or dyspnea on exertion Genito-Urinary ROS: no dysuria, trouble voiding, or hematuria Musculoskeletal ROS: negative Neurological ROS: no TIA  or stroke symptoms Dermatological ROS: negative  Physical Examination: BP (!) 156/66   Pulse 89   Temp 97.6 F (36.4 C) (Oral)   Resp 18   SpO2 98%  Physical Exam  Constitutional: Alicia Conley is oriented to person, place, and time. Alicia Conley appears well-developed and well-nourished. Alicia Conley appears distressed.  HENT:  Head: Normocephalic and atraumatic.  Right Ear: External ear normal.  Left Ear: External ear normal.  Eyes: Pupils are equal, round, and reactive to light. Conjunctivae are normal. No scleral icterus.  Neck: Neck supple. No tracheal deviation present. No thyromegaly present.  Cardiovascular: Normal rate and normal heart sounds.  Pulmonary/Chest: Effort normal. No respiratory distress.  Abdominal: Soft. Alicia Conley exhibits distension. Alicia Conley exhibits no mass. There is tenderness. There is no rebound and no guarding.  Bowel sounds markedly hypoactive  Musculoskeletal: Normal range of motion.  Neurological: Alicia Conley is alert and oriented to person, place, and time.  Skin: Capillary refill takes less than 2 seconds. No rash noted. No erythema.  Psychiatric: Alicia Conley has a normal mood and affect.    Data: Lab Results  Component Value Date   WBC 13.5 (H) 08/22/2017   HGB 12.8 08/22/2017   HCT 38.0 08/22/2017   MCV 86.1 08/22/2017   PLT 348 08/22/2017   Recent Labs  Lab 08/16/17 1741 08/17/2017 1847 08/22/17 0527  HGB 13.1 12.8 12.8   Lab Results  Component Value Date   NA 143 08/22/2017   K 3.2 (L) 08/22/2017   CL 111 08/22/2017   CO2 23 08/22/2017   BUN 24 (H) 08/22/2017   CREATININE 1.17 (H) 08/22/2017   Lab Results  Component Value Date   ALT 30 08/11/2017   AST 48 (H) 08/09/2017   ALKPHOS 58 08/03/2017   BILITOT 0.6 08/20/2017   Recent Labs  Lab 08/24/2017 1847  INR 1.05   CBC Latest Ref Rng & Units 08/22/2017 08/24/2017 08/16/2017  WBC 3.6 - 11.0 K/uL 13.5(H) 12.6(H) 13.5(H)  Hemoglobin 12.0 - 16.0 g/dL 12.8 12.8 13.1  Hematocrit 35.0 - 47.0 % 38.0 38.6 39.6  Platelets 150 - 440  K/uL 348 363 326    STUDIES: Dg Abd 2 Views  Result Date: 08/11/2017 CLINICAL DATA:  Bowel obstruction EXAM: ABDOMEN - 2 VIEW COMPARISON:  None. FINDINGS: Scattered gas containing small and large bowel loops without obstructive bowel gas pattern. Surgical tacks project over the mid abdomen presumably from ventral hernia repair. No free air, organomegaly nor suspicious radiopaque calculi. Lower lumbar facet arthropathy. IMPRESSION: Nonobstructed, nondistended bowel gas pattern. Electronically Signed   By: Ashley Royalty M.D.   On: 08/15/2017 19:55   @IMAGES @  Assessment: 1. Obstipation - KUB XR yesterday showed non-obstructive bowel gas pattern, but patient behaving as if Alicia Conley is still obstructed from a GI standpoint. CT ordered and pending. Appreciate Surgical consultation.  2. Abdominal pain - DDx includes diverticulitis, colorectal obstruction from infection or malignancy, severe constipation, etc.  3. Colonic diverticular disease.     Recommendations: 1. Continue NPO. 2. Await CT results. 3. Following along. I appreciate Medicine and Surgery help.   Thank you for the consult. Please call with questions or concerns.  Olean Ree, "Lanny Hurst MD [facility] [place], [place] [address] [phone]  08/22/2017 12:46 PM

## 2017-08-22 NOTE — Progress Notes (Signed)
Pt drank one bottle of contrast and started to feel like she was going to vomit. Prime doctor notified and gave verbal order for one time dose of zofran IV. On call surgeon was notified that pt can only handle one bottle of contrast, verbal order to continue with CT scan. CT was called and updated. Will continue to monitor pt.   Alicia Conley [employer]

## 2017-08-22 NOTE — H&P (View-Only) (Signed)
[facility] GI Inpatient Consult Note   [name], M.D.  Reason for Consult: abdominal pain, possible colonic obstruction, diverticulitis   Attending Requesting Consult: [name], M.D.  Outpatient Primary Physician:  [name], M.D.  History of Present Illness: Alicia Conley is a 77 y.o. female seen by me yesterday in my office at the request of DR. Astrid Divine, her Primary physician, for progressive abdominal pain and distension. Ms. Hudon is a pleasant 77 year old female with complaints of approximately 10 days of progressive abdominal distention, weight loss (16 pounds), obstipation and left lower quadrant abdominal pain. Last colonoscopy reportedly in 2005 and was "normal". She had a last EGD performed in 2015 at [facility] which was normal. Patient reports that she has been unable to eat in the last 2 days. She had one egg two days ago and apparently had vomited it up. Patient has no appetite currently. Patient was seen on 08/16/2017 at [facility] emergency department and underwent a CT scan showing severe fecal impaction in the sigmoid and descending colon. There was mild dilatation of the descending and transverse colon proximal to the area of obstruction. There was apparent diverticulosis of the sigmoid colon without diverticulitis. There was evidence of polycystic disease of both kidneys and liver. There was wall thickening in the sigmoid that was not clearly associated with the diverticula present. No mass was mentioned in the colon. There is mild leukocytosis with a white blood cell count of 14,000. The patient was treated for obstipation at the [facility] emergency room and told to go home and take enemas and MiraLAX. She was also given a prescription for ciprofloxacin and Flagyl for presumptive diverticulitis. Patient claims that neither of these remedies appeared to help relieve her symptoms and she is just having "watery discharge" from the  rectum. She is vomiting still despite no further po intake.  No obvious hematochezia. Patient denies any flatus.   Past Medical History:  Past Medical History:  Diagnosis Date  . High cholesterol   . Hypertension   . Thyroid disease     Problem List: Patient Active Problem List   Diagnosis Date Noted  . Abdominal pain 08/24/2017  . Ileus ([place]) 08/06/2017    Past Surgical History: Past Surgical History:  Procedure Laterality Date  . BREAST BIOPSY Left 11/07/12   u/s bx lt -neg  . BREAST CYST ASPIRATION Left    neg  . EP IMPLANTABLE DEVICE N/A 04/03/2016   Procedure: Loop Recorder Insertion;  Surgeon: Isaias Cowman, MD;  Location: [place] [facility];  Service: Cardiovascular;  Laterality: N/A;  . HERNIA REPAIR    . OOPHORECTOMY Bilateral     Allergies: No Known Allergies  Home Medications: Medications Prior to Admission  Medication Sig Dispense Refill Last Dose  . Aspirin (ASPIR-81 PO) Take 1 tablet by mouth 2 (two) times daily.   08/20/2017 at 2000  . atorvastatin (LIPITOR) 80 MG tablet Take 80 mg by mouth daily.   08/05/2017 at 0800  . Biotin 1 MG CAPS Take 1 mg by mouth 3 (three) times daily.   08/27/2017 at 0800  . cholecalciferol (VITAMIN D) 1000 units tablet Take 1,000 Units by mouth daily.   08/24/2017 at 0800  . Coenzyme Q10 (CO Q 10) 100 MG CAPS Take 1 capsule by mouth daily.   08/18/2017 at 0800  . cyclobenzaprine (FLEXERIL) 5 MG tablet Take 5 mg by mouth at bedtime as needed for muscle spasms.   PRN at PRN  .  enalapril (VASOTEC) 20 MG tablet Take 20 mg by mouth 2 (two) times daily.   08/05/2017 at 0800  . fluticasone (FLONASE) 50 MCG/ACT nasal spray Place 1 spray into both nostrils daily as needed for allergies or rhinitis.   PRN at PRN  . hydrochlorothiazide (HYDRODIURIL) 25 MG tablet Take 25 mg by mouth daily.   08/05/2017 at 0800  . levothyroxine (SYNTHROID, LEVOTHROID) 75 MCG tablet Take 75 mcg by mouth daily before breakfast.   08/13/2017 at 0800  . metoprolol  (LOPRESSOR) 100 MG tablet Take 100 mg by mouth 2 (two) times daily.   08/18/2017 at 0800  . omeprazole (PRILOSEC) 20 MG capsule Take 20 mg by mouth daily.    08/15/2017 at 0800  . sertraline (ZOLOFT) 100 MG tablet Take 100 mg by mouth daily.   08/31/2017 at 0800  . vitamin B-12 (CYANOCOBALAMIN) 1000 MCG tablet Take 1,000 mcg by mouth daily.   08/30/2017 at 0800   Home medication reconciliation was completed with the patient.   Scheduled Inpatient Medications:   . aspirin EC  81 mg Oral Daily  . atorvastatin  80 mg Oral Daily  . enoxaparin (LOVENOX) injection  40 mg Subcutaneous Q12H  . iopamidol  15 mL Oral Q1 Hr x 2  . levothyroxine  75 mcg Oral QAC breakfast  . metoprolol tartrate  100 mg Oral BID  . sertraline  100 mg Oral Daily    Continuous Inpatient Infusions:   . sodium chloride 100 mL/hr at 08/24/2017 1834  . famotidine (PEPCID) IV Stopped (08/07/2017 2042)    PRN Inpatient Medications:  acetaminophen **OR** acetaminophen, bisacodyl, fluticasone, hydrALAZINE, morphine injection, ondansetron **OR** ondansetron (ZOFRAN) IV  Family History: family history includes Breast cancer (age of onset: 56) in her cousin; Breast cancer (age of onset: 71) in her paternal aunt.   GI Family History: Negative  Social History:   reports that she has never smoked. She has never used smokeless tobacco. She reports that she does not drink alcohol or use drugs. The patient denies ETOH, tobacco, or drug use.    Review of Systems: Review of Systems - History obtained from the patient General ROS: positive for  - fatigue and weight loss Psychological ROS: negative Allergy and Immunology ROS: negative Hematological and Lymphatic ROS: negative Endocrine ROS: negative Respiratory ROS: no cough, shortness of breath, or wheezing Cardiovascular ROS: no chest pain or dyspnea on exertion Genito-Urinary ROS: no dysuria, trouble voiding, or hematuria Musculoskeletal ROS: negative Neurological ROS: no TIA  or stroke symptoms Dermatological ROS: negative  Physical Examination: BP (!) 156/66   Pulse 89   Temp 97.6 F (36.4 C) (Oral)   Resp 18   SpO2 98%  Physical Exam  Constitutional: She is oriented to person, place, and time. She appears well-developed and well-nourished. She appears distressed.  HENT:  Head: Normocephalic and atraumatic.  Right Ear: External ear normal.  Left Ear: External ear normal.  Eyes: Pupils are equal, round, and reactive to light. Conjunctivae are normal. No scleral icterus.  Neck: Neck supple. No tracheal deviation present. No thyromegaly present.  Cardiovascular: Normal rate and normal heart sounds.  Pulmonary/Chest: Effort normal. No respiratory distress.  Abdominal: Soft. She exhibits distension. She exhibits no mass. There is tenderness. There is no rebound and no guarding.  Bowel sounds markedly hypoactive  Musculoskeletal: Normal range of motion.  Neurological: She is alert and oriented to person, place, and time.  Skin: Capillary refill takes less than 2 seconds. No rash noted. No erythema.  Psychiatric: She has a normal mood and affect.    Data: Lab Results  Component Value Date   WBC 13.5 (H) 08/22/2017   HGB 12.8 08/22/2017   HCT 38.0 08/22/2017   MCV 86.1 08/22/2017   PLT 348 08/22/2017   Recent Labs  Lab 08/16/17 1741 08/08/2017 1847 08/22/17 0527  HGB 13.1 12.8 12.8   Lab Results  Component Value Date   NA 143 08/22/2017   K 3.2 (L) 08/22/2017   CL 111 08/22/2017   CO2 23 08/22/2017   BUN 24 (H) 08/22/2017   CREATININE 1.17 (H) 08/22/2017   Lab Results  Component Value Date   ALT 30 08/13/2017   AST 48 (H) 08/18/2017   ALKPHOS 58 08/29/2017   BILITOT 0.6 08/22/2017   Recent Labs  Lab 08/13/2017 1847  INR 1.05   CBC Latest Ref Rng & Units 08/22/2017 08/14/2017 08/16/2017  WBC 3.6 - 11.0 K/uL 13.5(H) 12.6(H) 13.5(H)  Hemoglobin 12.0 - 16.0 g/dL 12.8 12.8 13.1  Hematocrit 35.0 - 47.0 % 38.0 38.6 39.6  Platelets 150 - 440  K/uL 348 363 326    STUDIES: Dg Abd 2 Views  Result Date: 08/30/2017 CLINICAL DATA:  Bowel obstruction EXAM: ABDOMEN - 2 VIEW COMPARISON:  None. FINDINGS: Scattered gas containing small and large bowel loops without obstructive bowel gas pattern. Surgical tacks project over the mid abdomen presumably from ventral hernia repair. No free air, organomegaly nor suspicious radiopaque calculi. Lower lumbar facet arthropathy. IMPRESSION: Nonobstructed, nondistended bowel gas pattern. Electronically Signed   By: Ashley Royalty M.D.   On: 08/20/2017 19:55   @IMAGES @  Assessment: 1. Obstipation - KUB XR yesterday showed non-obstructive bowel gas pattern, but patient behaving as if she is still obstructed from a GI standpoint. CT ordered and pending. Appreciate Surgical consultation.  2. Abdominal pain - DDx includes diverticulitis, colorectal obstruction from infection or malignancy, severe constipation, etc.  3. Colonic diverticular disease.     Recommendations: 1. Continue NPO. 2. Await CT results. 3. Following along. I appreciate Medicine and Surgery help.   Thank you for the consult. Please call with questions or concerns.  Olean Ree, "Lanny Hurst MD [facility] [place], [place] [address] [phone]  08/22/2017 12:46 PM

## 2017-08-22 NOTE — Progress Notes (Signed)
[facility] NAME: Alicia Conley    MR#:  [id number]  DATE OF BIRTH:  1940-05-12  SUBJECTIVE:  CHIEF COMPLAINT:  No chief complaint on file.  Liquid diarrhea today.  Abdominal pain. REVIEW OF SYSTEMS:  Review of Systems  Constitutional: Negative for chills, fever and malaise/fatigue.  HENT: Negative for sore throat.   Eyes: Negative for blurred vision and double vision.  Respiratory: Negative for cough, hemoptysis, shortness of breath, wheezing and stridor.   Cardiovascular: Negative for chest pain, palpitations, orthopnea and leg swelling.  Gastrointestinal: Positive for abdominal pain, constipation, diarrhea and nausea. Negative for blood in stool, melena and vomiting.  Genitourinary: Negative for dysuria, flank pain and hematuria.  Musculoskeletal: Negative for back pain and joint pain.  Skin: Negative for rash.  Neurological: Negative for dizziness, sensory change, focal weakness, seizures, loss of consciousness, weakness and headaches.  Endo/Heme/Allergies: Negative for polydipsia.  Psychiatric/Behavioral: Negative for depression. The patient is not nervous/anxious.     DRUG ALLERGIES:  No Known Allergies VITALS:  Blood pressure (!) 148/62, pulse 76, temperature 98 F (36.7 C), temperature source Oral, resp. rate 18, SpO2 98 %. PHYSICAL EXAMINATION:  Physical Exam  Constitutional: She is oriented to person, place, and time.  Morbid obesity.  HENT:  Head: Normocephalic.  Mouth/Throat: Oropharynx is clear and moist.  Eyes: Pupils are equal, round, and reactive to light. Conjunctivae and EOM are normal. No scleral icterus.  Neck: Normal range of motion. Neck supple. No JVD present. No tracheal deviation present.  Cardiovascular: Normal rate, regular rhythm and normal heart sounds. Exam reveals no gallop.  No murmur heard. Pulmonary/Chest: Effort normal and breath sounds normal. No respiratory distress. She has no  wheezes. She has no rales.  Abdominal: Soft. Bowel sounds are normal. She exhibits distension. There is tenderness. There is no rebound and no guarding.  Musculoskeletal: Normal range of motion. She exhibits no edema or tenderness.  Neurological: She is alert and oriented to person, place, and time. No cranial nerve deficit.  Skin: No rash noted. No erythema.   LABORATORY PANEL:  Female CBC Recent Labs  Lab 08/22/17 0527  WBC 13.5*  HGB 12.8  HCT 38.0  PLT 348   ------------------------------------------------------------------------------------------------------------------ Chemistries  Recent Labs  Lab 09/01/2017 1847 08/14/2017 2038 08/22/17 0527  NA 140  --  143  K 2.6*  --  3.2*  CL 105  --  111  CO2 24  --  23  GLUCOSE 114*  --  117*  BUN 27*  --  24*  CREATININE 1.18*  --  1.17*  CALCIUM 9.1  --  8.6*  MG  --  2.1  --   AST 48*  --   --   ALT 30  --   --   ALKPHOS 58  --   --   BILITOT 0.6  --   --    RADIOLOGY:  Dg Abd 2 Views  Result Date: 08/14/2017 CLINICAL DATA:  Bowel obstruction EXAM: ABDOMEN - 2 VIEW COMPARISON:  None. FINDINGS: Scattered gas containing small and large bowel loops without obstructive bowel gas pattern. Surgical tacks project over the mid abdomen presumably from ventral hernia repair. No free air, organomegaly nor suspicious radiopaque calculi. Lower lumbar facet arthropathy. IMPRESSION: Nonobstructed, nondistended bowel gas pattern. Electronically Signed   By: Ashley Royalty M.D.   On: 08/22/2017 19:55   ASSESSMENT AND PLAN:   77 year old female patient with history of hypertension, hyperlipidemia, hypothyroidism referred  from GI clinic for direct admission for abdominal discomfort.  Obstipation Abdominal CT to rule out obstruction per Dr. Dahlia Byes and Dr. Alice Reichert GI.  -Acute Hypokalemia Replaced potassium IV  -Hypertension Resume metoprolol orally if no ileus  -Hyperlipidemia Resume statin orally if no ileus  -Dehydration IV  fluids  All the records are reviewed and case discussed with Care Management/Social Worker. Management plans discussed with the patient, her husband and they are in agreement.  CODE STATUS: Full Code  TOTAL TIME TAKING CARE OF THIS PATIENT: 33 minutes.   More than 50% of the time was spent in counseling/coordination of care: YES  POSSIBLE D/C IN 2 DAYS, DEPENDING ON CLINICAL CONDITION.   [name] M.D on 08/22/2017 at 2:18 PM  Between 7am to 6pm - Pager - [phone]  After 6pm go to [email or web address] - [facility]

## 2017-08-22 NOTE — Progress Notes (Signed)
Verbal order from Dr. Alice Reichert to keep pt NPO, may have ice chips until midnight 08/22/2017 then needs to be strict NPO. Verbal order to place tap water enemas to start 08/18/2017 at 1300 and then the second enema at 1330 prior to procedure tomorrow 08/24/2017.   Cassidy Tabet [employer]

## 2017-08-22 NOTE — Progress Notes (Addendum)
CT scan personal review and discussed with Dr. Owens Shark the radiologist.  Seems consistent with carcinomatosis.  Cannot exclude primary colorectal cancer.  Also discussed with Dr. Talbert Cage oncology and consult placed. Had a conversation with the patient and the family.  Currently the patient is having multiple liquid bowel movements and having flatus.  No evidence of large bowel obstruction is apparent  on clinical findings. Need for emergent surgical intervention. Recommendation for a flex sig to evaluate the sigmoid area possible rule out colorectal cancer as a sort of carcinomatosis, since the pt states she had bilateral oophorectomies several years ago

## 2017-08-22 NOTE — Progress Notes (Signed)
Pharmacy consulted for electrolyte replacement protocol:   Goal of therapy: Electrolytes within normal limits:  K 3.5 - 5.1 Corrected Ca 8.9 - 10.3 Phos 2.5 - 4.6 Mg 1.7 - 2.4   Assessment: Lab Results  Component Value Date   CREATININE 1.17 (H) 08/22/2017   BUN 24 (H) 08/22/2017   NA 143 08/22/2017   K 3.2 (L) 08/22/2017   CL 111 08/22/2017   CO2 23 08/22/2017    Plan: K 2.6, ordering KCl 67meq iv q1h x 5 doses. Will reheck with AM labs. Will order Magnesium now and replace as necessary for hypomagnesia. Will order am labs per protocol.  6/19  Mag @ 20:30 = 2.1 No additional mag needed, will recheck electrolytes on 6/20 with AM labs  6/20 K 3.2, Normally would prefer po potassium replacement at this point but she remains NPO I will order KCl 62mEq IV q1h x 3 doses and follow K level in the morning 6/21  Dallie Piles, PharmD Clinical Pharmacist [facility]

## 2017-08-22 NOTE — Progress Notes (Signed)
Dr. Alice Reichert was called and notified that pt has had 3 loose, watery, yellow bowel movements within the last 24 hours. New orders placed by MD. Pt updated and verbalized understanding the reason of isolation.   Alicia Conley [employer]

## 2017-08-22 NOTE — Consult Note (Signed)
Patient ID: Alicia Conley, female   DOB: [birth year], 77 y.o.   MRN: [id number]  HPI Alicia Conley is a 77 y.o. female seen in consultation by Dr.Pyreddy. Comes to the hospital after seen by urgent care [place] for abdominal pain.  She reports abdominal pain for the last 2 to 3 weeks that is intermittent moderate intensity and sharp in nature.  The pain is diffuse.  No specific alleviating or aggravating factors.  She also reports constipation as well as nausea and some emesis. Over the last few days the pain has worsened.  She has also had multiple trips to the emergency room where aggressive cathartics and enemas were given without any significant improvement.  She had a CT scan that I do not have any images this was done last week showing apparently some fecal impaction at the sigmoid level.  No evidence of free air. Did have another KUB last night that I have personally reviewed showing no evidence of free air no evidence of large or small bowel obstruction. Did have a slight increase in the white count to 13 with a normal creatinine.  Her potassium was 2.6 and is currently being replaced. Did have a history of open ventral hernia repair with mesh performed at [place] more than 10 years ago.  HPI  Past Medical History:  Diagnosis Date  . High cholesterol   . Hypertension   . Thyroid disease     Past Surgical History:  Procedure Laterality Date  . BREAST BIOPSY Left 11/07/12   u/s bx lt -neg  . BREAST CYST ASPIRATION Left    neg  . EP IMPLANTABLE DEVICE N/A 04/03/2016   Procedure: Loop Recorder Insertion;  Surgeon: Isaias Cowman, MD;  Location: [facility];  Service: Cardiovascular;  Laterality: N/A;  . HERNIA REPAIR    . OOPHORECTOMY Bilateral     Family History  Problem Relation Age of Onset  . Breast cancer Paternal Aunt 3  . Breast cancer Cousin 78       mat cousin    Social History Social History   Tobacco Use  . Smoking status: Never Smoker  . Smokeless  tobacco: Never Used  Substance Use Topics  . Alcohol use: Never    Frequency: Never  . Drug use: Never    No Known Allergies  Current Facility-Administered Medications  Medication Dose Route Frequency Provider Last Rate Last Dose  . 0.9 %  sodium chloride infusion   Intravenous Continuous Saundra Shelling, MD 100 mL/hr at 08/17/2017 1834    . acetaminophen (TYLENOL) tablet 650 mg  650 mg Oral Q6H PRN Saundra Shelling, MD       Or  . acetaminophen (TYLENOL) suppository 650 mg  650 mg Rectal Q6H PRN Saundra Shelling, MD      . aspirin EC tablet 81 mg  81 mg Oral Daily Pyreddy, Pavan, MD      . atorvastatin (LIPITOR) tablet 80 mg  80 mg Oral Daily Pyreddy, Pavan, MD      . bisacodyl (DULCOLAX) suppository 10 mg  10 mg Rectal Daily PRN Pyreddy, Reatha Harps, MD      . enoxaparin (LOVENOX) injection 40 mg  40 mg Subcutaneous Q12H Pyreddy, Reatha Harps, MD   40 mg at 08/13/2017 2009  . famotidine (PEPCID) IVPB 20 mg premix  20 mg Intravenous Q24H Saundra Shelling, MD   Stopped at 08/11/2017 2042  . fluticasone (FLONASE) 50 MCG/ACT nasal spray 1 spray  1 spray Each Nare Daily PRN Saundra Shelling, MD      .  hydrALAZINE (APRESOLINE) injection 10 mg  10 mg Intravenous Q4H PRN Saundra Shelling, MD   10 mg at 08/22/17 0422  . levothyroxine (SYNTHROID, LEVOTHROID) tablet 75 mcg  75 mcg Oral QAC breakfast Pyreddy, Pavan, MD      . metoprolol tartrate (LOPRESSOR) tablet 100 mg  100 mg Oral BID Pyreddy, Reatha Harps, MD      . morphine 2 MG/ML injection 2 mg  2 mg Intravenous Q4H PRN Saundra Shelling, MD   2 mg at 08/22/17 0422  . ondansetron (ZOFRAN) tablet 4 mg  4 mg Oral Q6H PRN Saundra Shelling, MD       Or  . ondansetron (ZOFRAN) injection 4 mg  4 mg Intravenous Q6H PRN Saundra Shelling, MD   4 mg at 08/22/17 0743  . potassium chloride 10 mEq in 100 mL IVPB  10 mEq Intravenous Q2H Pyreddy, Reatha Harps, MD 100 mL/hr at 08/22/17 0922 10 mEq at 08/22/17 0922  . sertraline (ZOLOFT) tablet 100 mg  100 mg Oral Daily Pyreddy, Pavan, MD         Review  of Systems Full ROS  was asked and was negative except for the information on the HPI  Physical Exam Blood pressure (!) 141/59, pulse 87, temperature 97.6 F (36.4 C), temperature source Oral, resp. rate 18, SpO2 98 %. CONSTITUTIONAL: Morbidly obese female in NAD EYES: Pupils are equal, round, and reactive to light, Sclera are non-icteric. EARS, NOSE, MOUTH AND THROAT: The oropharynx is clear. The oral mucosa is pink and moist. Hearing is intact to voice. LYMPH NODES:  Lymph nodes in the neck are normal. RESPIRATORY:  Lungs are clear. There is normal respiratory effort, with equal breath sounds bilaterally, and without pathologic use of accessory muscles. CARDIOVASCULAR: Heart is regular without murmurs, gallops, or rubs. GI: The abdomen is soft, tender to palpation diffusely, no rebound or overt peritonitis. Large panus, decrease BS. GU: Rectal deferred.   MUSCULOSKELETAL: Normal muscle strength and tone. No cyanosis or edema.   SKIN: Turgor is good and there are no pathologic skin lesions or ulcers. NEUROLOGIC: Motor and sensation is grossly normal. Cranial nerves are grossly intact. PSYCH:  Oriented to person, place and time. Affect is normal.  Data Reviewed  I have personally reviewed the patient's imaging, laboratory findings and medical records.    Assessment/Plan 77 year old female with abdominal pain consistent with fecal impaction.  Given that the CT scan was done about a week ago and that I cannot see the images and the patient has worsening symptoms the next step which would be to repeat the CT scan to rule out any evidence of perforation within the colon.  I have discussed with the patient in detail.  For now after the CT scan we will make sure that she is n.p.o. This point I do not see a need for emergent surgical intervention at this time.  If we can rule out any ischemia or evidence of a complete bowel obstruction and alternative will be to disimpact her with a colonoscopy if  GI is willing to perform this. We will continue to follow w serial abdominal exams and x rays. Says the x-ray for tomorrow and the CT scan for today I will order a CBC and a BMP for tomorrow.  Caroleen Hamman, MD FACS General Surgeon 08/22/2017, 10:26 AM

## 2017-08-22 NOTE — Telephone Encounter (Signed)
Received call from [facility] regarding CT scan today.  I called and spoke with OR nurse due to DR.Pabon performing surgery and verbally gave the report to relay the message to Dr.Pabon.  Also sent staff message to Dr.Pabon.

## 2017-08-23 ENCOUNTER — Encounter: Admission: AD | Disposition: E | Payer: Self-pay | Source: Home / Self Care | Attending: Family Medicine

## 2017-08-23 ENCOUNTER — Ambulatory Visit: Admit: 2017-08-23 | Payer: Medicare HMO | Admitting: Internal Medicine

## 2017-08-23 ENCOUNTER — Inpatient Hospital Stay: Payer: Medicare HMO

## 2017-08-23 ENCOUNTER — Inpatient Hospital Stay: Payer: Medicare HMO | Admitting: Anesthesiology

## 2017-08-23 ENCOUNTER — Encounter: Payer: Self-pay | Admitting: Certified Registered Nurse Anesthetist

## 2017-08-23 DIAGNOSIS — K56609 Unspecified intestinal obstruction, unspecified as to partial versus complete obstruction: Secondary | ICD-10-CM

## 2017-08-23 DIAGNOSIS — C762 Malignant neoplasm of abdomen: Secondary | ICD-10-CM

## 2017-08-23 DIAGNOSIS — Z803 Family history of malignant neoplasm of breast: Secondary | ICD-10-CM

## 2017-08-23 HISTORY — PX: FLEXIBLE SIGMOIDOSCOPY: SHX5431

## 2017-08-23 LAB — BASIC METABOLIC PANEL
ANION GAP: 9 (ref 5–15)
BUN: 17 mg/dL (ref 6–20)
CALCIUM: 8 mg/dL — AB (ref 8.9–10.3)
CO2: 22 mmol/L (ref 22–32)
CREATININE: 0.88 mg/dL (ref 0.44–1.00)
Chloride: 112 mmol/L — ABNORMAL HIGH (ref 101–111)
GFR calc Af Amer: >60 mL/min (ref >60)
GLUCOSE: 80 mg/dL (ref 65–99)
Potassium: 3.5 mmol/L (ref 3.5–5.1)
Sodium: 143 mmol/L (ref 135–145)

## 2017-08-23 LAB — CBC
HCT: 35.8 % (ref 35.0–47.0)
Hemoglobin: 11.7 g/dL — ABNORMAL LOW (ref 12.0–16.0)
MCH: 28.5 pg (ref 26.0–34.0)
MCHC: 32.7 g/dL (ref 32.0–36.0)
MCV: 87 fL (ref 80.0–100.0)
PLATELETS: 358 10*3/uL (ref 150–440)
RBC: 4.11 MIL/uL (ref 3.80–5.20)
RDW: 15.1 % — ABNORMAL HIGH (ref 11.5–14.5)
WBC: 11.5 10*3/uL — AB (ref 3.6–11.0)

## 2017-08-23 SURGERY — SIGMOIDOSCOPY, FLEXIBLE
Anesthesia: General

## 2017-08-23 MED ORDER — PROPOFOL 500 MG/50ML IV EMUL
INTRAVENOUS | Status: DC | PRN
  Administered 2017-08-23: 120 ug/kg/min via INTRAVENOUS

## 2017-08-23 MED ORDER — LIDOCAINE HCL (CARDIAC) PF 100 MG/5ML IV SOSY
PREFILLED_SYRINGE | INTRAVENOUS | Status: DC | PRN
  Administered 2017-08-23: 50 mg via INTRAVENOUS

## 2017-08-23 MED ORDER — LIDOCAINE HCL (PF) 2 % IJ SOLN
INTRAMUSCULAR | Status: AC
  Filled 2017-08-23: qty 10

## 2017-08-23 MED ORDER — ALUM & MAG HYDROXIDE-SIMETH 200-200-20 MG/5ML PO SUSP
15.0000 mL | ORAL | Status: DC | PRN
Start: 2017-08-23 — End: 2017-09-08
  Administered 2017-08-26 – 2017-09-01 (×3): 15 mL via ORAL
  Filled 2017-08-23 (×3): qty 30

## 2017-08-23 MED ORDER — PROPOFOL 500 MG/50ML IV EMUL
INTRAVENOUS | Status: AC
  Filled 2017-08-23: qty 50

## 2017-08-23 MED ORDER — PROPOFOL 10 MG/ML IV BOLUS
INTRAVENOUS | Status: DC | PRN
  Administered 2017-08-23: 100 mg via INTRAVENOUS

## 2017-08-23 NOTE — Progress Notes (Signed)
CC: carcinomatosis Subjective: Partial Large BO She feels better this am Having flatus and diarrhea ( multiple liquid BM) Pain signigicantly improved No emesis c diff negative Pending oncology consult KUB personally reviewed, no free air no pneumatosis, no worsening of large bowel distension   Objective: Vital signs in last 24 hours: Temp:  [97.9 F (36.6 C)-98.4 F (36.9 C)] 97.9 F (36.6 C) (06/21 0531) Pulse Rate:  [57-76] 58 (06/21 0933) Resp:  [18-20] 20 (06/21 0531) BP: (138-169)/(41-69) 138/50 (06/21 0933) SpO2:  [90 %-94 %] 92 % (06/21 0531) Weight:  [126.9 kg (279 lb 12.2 oz)] 126.9 kg (279 lb 12.2 oz) (06/20 1540) Last BM Date: 08/17/2017  Intake/Output from previous day: 06/20 0701 - 06/21 0700 In: 2278 [P.O.:120; I.V.:1906; IV Piggyback:252] Out: 1000 [Urine:600; Stool:400] Intake/Output this shift: Total I/O In: -  Out: 200 [Stool:200]  Physical exam:  NAD, resting comfortable Abd: soft, mass like abd wall ( likely from omental caking) no peritonitis Ext: warm and well perfused  Lab Results: CBC  Recent Labs    08/22/17 0527 08/12/2017 0422  WBC 13.5* 11.5*  HGB 12.8 11.7*  HCT 38.0 35.8  PLT 348 358   BMET Recent Labs    08/22/17 0527 08/20/2017 0422  NA 143 143  K 3.2* 3.5  CL 111 112*  CO2 23 22  GLUCOSE 117* 80  BUN 24* 17  CREATININE 1.17* 0.88  CALCIUM 8.6* 8.0*   PT/INR Recent Labs    08/22/2017 1847  LABPROT 13.6  INR 1.05   ABG No results for input(s): PHART, HCO3 in the last 72 hours.  Invalid input(s): PCO2, PO2  Studies/Results: Ct Abdomen Pelvis W Contrast  Result Date: 08/22/2017 CLINICAL DATA:  Follow-up CT scan concerning for bowel obstruction. LEFT lower quadrant pain abdominal pain for 2 weeks. Last bowel movement 1 week ago. EXAM: CT ABDOMEN AND PELVIS WITH CONTRAST TECHNIQUE: Multidetector CT imaging of the abdomen and pelvis was performed using the standard protocol following bolus administration of intravenous  contrast. CONTRAST:  93mL ISOVUE-300 IOPAMIDOL (ISOVUE-300) INJECTION 61% COMPARISON:  CT 11/28/2013 FINDINGS: Lower chest: Lung bases are clear. Hepatobiliary: Small nodular densities along the surface of the RIGHT hepatic lobe (image 28/2 image 33/2). Nodules measure from 3 to 5 mm are partially calcified. Sludge or stones in the gallbladder. Small amount pericholecystic fluid adjacent to fundus of the bladder. No biliary duct dilatation. The low several low-density cysts in the liver. Pancreas: Pancreas is normal. No ductal dilatation. No pancreatic inflammation. Spleen: Normal spleen Adrenals/urinary tract: Adrenal glands are normal. Several intermediate density or enhancing lesions the renal cortex. For example 14 mm lesion along the lateral margin of theRIGHT lower pole (image 44/2). Cortical lesion measuring 18 mm on the anterior upper pole on the LEFT (image 24/2). There is mild hydroureter on the RIGHT hydroureter extends to the level of the pelvis. There is peritoneal nodularity in the pelvis mesentery measuring 18 mm appears to be fanning out in the colonic mesentery (image 75/2.) This may obstruct the ureter. Bladder is normal Stomach/Bowel: Stomach, duodenum and small bowel are normal. The ascending, transverse and descending colon are fluid-filled and without haustral markings. This fluid-filled colon extends the level sigmoid colon with there is caliber change. This is in the region of the mesenteric nodularity described renal section. The sigmoid colon is narrowed with potential calcified peritoneal densities (image 73/2). Rectum normal Vascular/Lymphatic: Abdominal aorta is normal caliber. No periportal or retroperitoneal adenopathy. No pelvic adenopathy. Reproductive: Uterus normal.  Ovaries not  identified Other: There is a ventral hernia repair. There is enhancing nodularity along the ventral peritoneal surface with a frondlike appearance (image 40/2 for example). Small frondlike cluster measures  21 mm (image 40/2). This is similar to the mesenteric nodularity in the pelvis as well as the nodularity along the RIGHT hepatic lobe. Small calcified nodules also extends along the LEFT pericolic gutter. Musculoskeletal: No aggressive osseous lesion. IMPRESSION: 1. The ascending, transverse and descending colon is ahaustral, fluid-filled, and mildly distended to the level of sigmoid colon. A caliber change at sigmoid colon with serosal nodularity concerning for obstruction from peritoneal carcinomatosis. 2. Nodular lesions which are partially calcified along the hepatic margin, pelvic sigmoid mesocolon pelvis as well as the ventral peritoneal space. 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 Partially calcified nodularity suggest potential ovarian source. The ventral midline peritoneal nodularity may most assessable for biopsy. 3. Mild entrapment of the distal RIGHT ureter associated with the mesenteric nodularity. 4. Indeterminate renal lesions. Recommend follow-up MRI renal protocol at some point. These results will be called to the ordering clinician or representative by the Radiologist Assistant, and communication documented in the PACS or zVision Dashboard. Electronically Signed   By: Suzy Bouchard M.D.   On: 08/22/2017 15:09   Dg Abd 2 Views  Result Date: 08/14/2017 CLINICAL DATA:  Bowel obstruction EXAM: ABDOMEN - 2 VIEW COMPARISON:  None. FINDINGS: Scattered gas containing small and large bowel loops without obstructive bowel gas pattern. Surgical tacks project over the mid abdomen presumably from ventral hernia repair. No free air, organomegaly nor suspicious radiopaque calculi. Lower lumbar facet arthropathy. IMPRESSION: Nonobstructed, nondistended bowel gas pattern. Electronically Signed   By: Ashley Royalty M.D.   On: 08/29/2017 19:55   Dg Abd Acute W/chest  Result Date: 08/06/2017 CLINICAL DATA:  Left lower quadrant pain EXAM: DG ABDOMEN ACUTE W/ 1V CHEST COMPARISON:  08/22/2017  FINDINGS: Cardiac shadow is within normal limits. Loop recorder is noted. Left basilar atelectatic changes and small effusion are noted increased from the previous day. Right lung remains clear. No free air is noted. Mild dilatation of the colon is noted similar to that seen on the prior CT examination. Contrast is noted within the bladder. No bony abnormality is seen. IMPRESSION: Stable dilatation of the colon when compared with the prior exam. Increasing left basilar atelectasis and effusion. Electronically Signed   By: Inez Catalina M.D.   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 May do flex sig today IF obstruction persist will do diverting loop colostomy, her images definitely show a hostile abdomen with multiple peritoneal implants and caking, making this a difficult procedure. Keep NPO for now  Caroleen Hamman, MD, [facility]  08/07/2017

## 2017-08-23 NOTE — Transfer of Care (Signed)
Immediate Anesthesia Transfer of Care Note  Patient: Alicia Conley  Procedure(s) Performed: FLEXIBLE SIGMOIDOSCOPY (N/A )  Patient Location: PACU and Endoscopy Unit  Anesthesia Type:General  Level of Consciousness: drowsy  Airway & Oxygen Therapy: Patient Spontanous Breathing and Patient connected to nasal cannula oxygen  Post-op Assessment: Report given to RN and Post -op Vital signs reviewed and stable  Post vital signs: Reviewed and stable  Last Vitals:  Vitals Value Taken Time  BP 175/64 08/14/2017  2:14 PM  Temp 36.2 C 08/27/2017  2:13 PM  Pulse 70 08/08/2017  2:15 PM  Resp 16 08/08/2017  2:15 PM  SpO2 98 % 08/17/2017  2:15 PM  Vitals shown include unvalidated device data.  Last Pain:  Vitals:   08/31/2017 1413  TempSrc: Tympanic  PainSc: Asleep      Patients Stated Pain Goal: 1 (93/24/19 9144)  Complications: No apparent anesthesia complications

## 2017-08-23 NOTE — Progress Notes (Signed)
Verbal order from GI to not complete tap water enemas as scheduled due to procedure time being changed. Report given to endo nurse. Pt and pt.'s family is updated that procedure time has been changed and transport is on the way to come get pt to take to endo.   Alicia Conley [employer]

## 2017-08-23 NOTE — Consult Note (Signed)
Hematology/Oncology Consult note [facility] Telephone:([phone] [phone]) [phone]  Patient Care Team: [name], MD as PCP - General (Family Medicine)   Name of the patient: Alicia Conley  [id number]  [birth year]   Date of visit: 08/30/2017 REASON FOR COSULTATION:  Abdominal carcinomatosis History of presenting illness-  77 y.o. female with PMH listed at below who presents to ER for evaluation of abdominal pain.  She reports abdominal pain has been gradual about 2 to 3 weeks, intermittently, getting worse.  Sharp in nature.  It is located  diffusely on her aggravating or  alleviating factors.  Patient had CT abdomen pelvis emergency room which showed ascending, transverse, descending colon fluid-filled, mildly distended to the level of sigmoid colon.  Concerning for obstruction.  Nodularity along the hepatic margin, pelvic sigmoid pelvis as well as ventral peritoneal space.  Questionable ovarian source.  Indeterminate renal lesions.  She has been evaluated by surgeon Dr. Dahlia Byes no   Emergent needed at this point.  Surgical intervention She  was also evaluated by gastroenterologist Dr. Alice Reichert in the plan for a flexible sigmoidoscopy this afternoon.  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  no intrinsic mass was identified.There was extrinsic compression.  Patient reports up-to-date with her mammogram, last one done 10/31 2018.  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 Family history of breast cancer.  Paternal aunt and cousin had breast cancer.  Review of Systems  Constitutional: Positive for malaise/fatigue. Negative for chills, fever and weight loss.  HENT: Negative for congestion, ear discharge, ear pain, nosebleeds, sinus pain and sore throat.   Eyes: Negative for double vision, photophobia, pain, discharge and redness.  Respiratory: Negative for cough, sputum production,  shortness of breath and wheezing.   Cardiovascular: Negative for chest pain, palpitations, claudication and leg swelling.  Gastrointestinal: Positive for abdominal pain. Negative for blood in stool, heartburn, nausea and vomiting.  Genitourinary: Negative for dysuria, flank pain, frequency and hematuria.  Musculoskeletal: Negative for myalgias.  Skin: Negative for itching and rash.  Neurological: Negative for dizziness, tingling, tremors, focal weakness, weakness and headaches.  Endo/Heme/Allergies: Negative for environmental allergies. Does not bruise/bleed easily.  Psychiatric/Behavioral: Negative for depression and hallucinations. The patient is not nervous/anxious.     No Known Allergies  Patient Active Problem List   Diagnosis Date Noted  . Large bowel obstruction ([place])   . Abdominal pain 08/29/2017  . Ileus ([place]) 09/01/2017     Past Medical History:  Diagnosis Date  . High cholesterol   . Hypertension   . Thyroid disease      Past Surgical History:  Procedure Laterality Date  . BREAST BIOPSY Left 11/07/12   u/s bx lt -neg  . BREAST CYST ASPIRATION Left    neg  . EP IMPLANTABLE DEVICE N/A 04/03/2016   Procedure: Loop Recorder Insertion;  Surgeon: Isaias Cowman, MD;  Location: [facility];  Service: Cardiovascular;  Laterality: N/A;  . HERNIA REPAIR    . OOPHORECTOMY Bilateral     Social History   Socioeconomic History  . Marital status: Married    Spouse name: Not on file  . Number of children: Not on file  . Years of education: Not on file  . Highest education level: Not on file  Occupational History  . Not on file  Social Needs  . Financial resource strain: Not on file  . Food insecurity:    Worry: Not on file  Inability: Not on file  . Transportation needs:    Medical: Not on file    Non-medical: Not on file  Tobacco Use  . Smoking status: Never Smoker  . Smokeless tobacco: Never Used  Substance and Sexual Activity  . Alcohol use:  Never    Frequency: Never  . Drug use: Never  . Sexual activity: Never  Lifestyle  . Physical activity:    Days per week: Not on file    Minutes per session: Not on file  . Stress: Not on file  Relationships  . Social connections:    Talks on phone: Not on file    Gets together: Not on file    Attends religious service: Not on file    Active member of club or organization: Not on file    Attends meetings of clubs or organizations: Not on file    Relationship status: Not on file  . Intimate partner violence:    Fear of current or ex partner: Not on file    Emotionally abused: Not on file    Physically abused: Not on file    Forced sexual activity: Not on file  Other Topics Concern  . Not on file  Social History Narrative  . Not on file     Family History  Problem Relation Age of Onset  . Breast cancer Paternal Aunt 76  . Breast cancer Cousin 55       mat cousin     Current Facility-Administered Medications:  .  0.9 %  sodium chloride infusion, , Intravenous, Continuous, Pyreddy, Pavan, MD, Last Rate: 100 mL/hr at 09/01/2017 0230 .  [MAR Hold] acetaminophen (TYLENOL) tablet 650 mg, 650 mg, Oral, Q6H PRN, 650 mg at 08/22/17 1255 **OR** [MAR Hold] acetaminophen (TYLENOL) suppository 650 mg, 650 mg, Rectal, Q6H PRN, Pyreddy, Pavan, MD .  Doug Sou Hold] alum & mag hydroxide-simeth (MAALOX/MYLANTA) 200-200-20 MG/5ML suspension 15 mL, 15 mL, Oral, Q4H PRN, [name], MD .  Doug Sou Hold] aspirin EC tablet 81 mg, 81 mg, Oral, Daily, Pyreddy, Pavan, MD, 81 mg at 08/31/2017 0933 .  [MAR Hold] atorvastatin (LIPITOR) tablet 80 mg, 80 mg, Oral, Daily, Pyreddy, Pavan, MD, 80 mg at 08/09/2017 0933 .  [MAR Hold] bisacodyl (DULCOLAX) suppository 10 mg, 10 mg, Rectal, Daily PRN, Pyreddy, Pavan, MD .  Doug Sou Hold] enoxaparin (LOVENOX) injection 40 mg, 40 mg, Subcutaneous, Q24H, [name], MD, 40 mg at 08/31/2017 1311 .  [MAR Hold] famotidine (PEPCID) IVPB 20 mg premix, 20 mg, Intravenous, Q24H, Saundra Shelling, MD, Stopped at 08/22/17 1920 .  [MAR Hold] feeding supplement (BOOST / RESOURCE BREEZE) liquid 1 Container, 1 Container, Oral, TID WC, [name], MD .  Doug Sou Hold] fluticasone (FLONASE) 50 MCG/ACT nasal spray 1 spray, 1 spray, Each Nare, Daily PRN, Saundra Shelling, MD .  Doug Sou Hold] hydrALAZINE (APRESOLINE) injection 10 mg, 10 mg, Intravenous, Q4H PRN, Pyreddy, Pavan, MD, 10 mg at 08/22/17 0422 .  [MAR Hold] levothyroxine (SYNTHROID, LEVOTHROID) tablet 75 mcg, 75 mcg, Oral, QAC breakfast, Pyreddy, Pavan, MD .  Doug Sou Hold] metoprolol tartrate (LOPRESSOR) tablet 100 mg, 100 mg, Oral, BID, Pyreddy, Pavan, MD, 100 mg at 08/20/2017 0933 .  [MAR Hold] morphine 2 MG/ML injection 2 mg, 2 mg, Intravenous, Q4H PRN, Pyreddy, Pavan, MD, 2 mg at 09/01/2017 0933 .  [MAR Hold] ondansetron (ZOFRAN) tablet 4 mg, 4 mg, Oral, Q6H PRN **OR** [MAR Hold] ondansetron (ZOFRAN) injection 4 mg, 4 mg, Intravenous, Q6H PRN, Pyreddy, Pavan, MD, 4 mg at 08/22/17 0743 .  [  MAR Hold] sertraline (ZOLOFT) tablet 100 mg, 100 mg, Oral, Daily, Pyreddy, Pavan, MD, 100 mg at 08/24/2017 0933   Physical exam: ECOG  Vitals:   08/12/2017 1344 08/19/2017 1413 08/05/2017 1423 08/06/2017 1433  BP: (!) 145/117 (!) 175/64 (!) 165/74 (!) 168/67  Pulse: (!) 56 69 (!) 59 (!) 55  Resp: 20 17 16 14   Temp: (!) 96.2 F (35.7 C) (!) 97.1 F (36.2 C)    TempSrc: Tympanic Tympanic    SpO2: 97% 94% 98% 98%  Weight:       Physical Exam  Constitutional: She is oriented to person, place, and time.  HENT:  Head: Normocephalic and atraumatic.  Nose: Nose normal.  Mouth/Throat: Oropharynx is clear and moist. No oropharyngeal exudate.  Eyes: Pupils are equal, round, and reactive to light. EOM are normal. Left eye exhibits no discharge. No scleral icterus.  Neck: Normal range of motion. Neck supple. No JVD present.  Cardiovascular: Normal rate, regular rhythm and normal heart sounds.  No murmur heard. Pulmonary/Chest: Effort normal and breath sounds normal.  No respiratory distress. She has no wheezes.  Abdominal: Soft. She exhibits distension. There is no guarding.  Musculoskeletal: Normal range of motion. She exhibits no edema or tenderness.  Lymphadenopathy:    She has no cervical adenopathy.  Neurological: She is alert and oriented to person, place, and time. No cranial nerve deficit.  Skin: Skin is warm and dry. No rash noted. She is not diaphoretic. No erythema.  Psychiatric: Affect normal.        CMP Latest Ref Rng & Units 08/07/2017  Glucose 65 - 99 mg/dL 80  BUN 6 - 20 mg/dL 17  Creatinine 0.44 - 1.00 mg/dL 0.88  Sodium 135 - 145 mmol/L 143  Potassium 3.5 - 5.1 mmol/L 3.5  Chloride 101 - 111 mmol/L 112(H)  CO2 22 - 32 mmol/L 22  Calcium 8.9 - 10.3 mg/dL 8.0(L)  Total Protein 6.5 - 8.1 g/dL -  Total Bilirubin 0.3 - 1.2 mg/dL -  Alkaline Phos 38 - 126 U/L -  AST 15 - 41 U/L -  ALT 14 - 54 U/L -   CBC Latest Ref Rng & Units 08/19/2017  WBC 3.6 - 11.0 K/uL 11.5(H)  Hemoglobin 12.0 - 16.0 g/dL 11.7(L)  Hematocrit 35.0 - 47.0 % 35.8  Platelets 150 - 440 K/uL 358   RADIOGRAPHIC STUDIES: I have personally reviewed the radiological images as listed and agreed with the findings in the report. Ct Abdomen Pelvis W Contrast  Result Date: 08/22/2017 CLINICAL DATA:  Follow-up CT scan concerning for bowel obstruction. LEFT lower quadrant pain abdominal pain for 2 weeks. Last bowel movement 1 week ago. EXAM: CT ABDOMEN AND PELVIS WITH CONTRAST TECHNIQUE: Multidetector CT imaging of the abdomen and pelvis was performed using the standard protocol following bolus administration of intravenous contrast. CONTRAST:  66mL ISOVUE-300 IOPAMIDOL (ISOVUE-300) INJECTION 61% COMPARISON:  CT 11/28/2013 FINDINGS: Lower chest: Lung bases are clear. Hepatobiliary: Small nodular densities along the surface of the RIGHT hepatic lobe (image 28/2 image 33/2). Nodules measure from 3 to 5 mm are partially calcified. Sludge or stones in the gallbladder. Small  amount pericholecystic fluid adjacent to fundus of the bladder. No biliary duct dilatation. The low several low-density cysts in the liver. Pancreas: Pancreas is normal. No ductal dilatation. No pancreatic inflammation. Spleen: Normal spleen Adrenals/urinary tract: Adrenal glands are normal. Several intermediate density or enhancing lesions the renal cortex. For example 14 mm lesion along the lateral margin of theRIGHT lower pole (  image 44/2). Cortical lesion measuring 18 mm on the anterior upper pole on the LEFT (image 24/2). There is mild hydroureter on the RIGHT hydroureter extends to the level of the pelvis. There is peritoneal nodularity in the pelvis mesentery measuring 18 mm appears to be fanning out in the colonic mesentery (image 75/2.) This may obstruct the ureter. Bladder is normal Stomach/Bowel: Stomach, duodenum and small bowel are normal. The ascending, transverse and descending colon are fluid-filled and without haustral markings. This fluid-filled colon extends the level sigmoid colon with there is caliber change. This is in the region of the mesenteric nodularity described renal section. The sigmoid colon is narrowed with potential calcified peritoneal densities (image 73/2). Rectum normal Vascular/Lymphatic: Abdominal aorta is normal caliber. No periportal or retroperitoneal adenopathy. No pelvic adenopathy. Reproductive: Uterus normal.  Ovaries not identified Other: There is a ventral hernia repair. There is enhancing nodularity along the ventral peritoneal surface with a frondlike appearance (image 40/2 for example). Small frondlike cluster measures 21 mm (image 40/2). This is similar to the mesenteric nodularity in the pelvis as well as the nodularity along the RIGHT hepatic lobe. Small calcified nodules also extends along the LEFT pericolic gutter. Musculoskeletal: No aggressive osseous lesion. IMPRESSION: 1. The ascending, transverse and descending colon is ahaustral, fluid-filled, and mildly  distended to the level of sigmoid colon. A caliber change at sigmoid colon with serosal nodularity concerning for obstruction from peritoneal carcinomatosis. 2. Nodular lesions which are partially calcified along the hepatic margin, pelvic sigmoid mesocolon pelvis as well as the ventral peritoneal space. 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 Partially calcified nodularity suggest potential ovarian source. The ventral midline peritoneal nodularity may most assessable for biopsy. 3. Mild entrapment of the distal RIGHT ureter associated with the mesenteric nodularity. 4. Indeterminate renal lesions. Recommend follow-up MRI renal protocol at some point. These results will be called to the ordering clinician or representative by the Radiologist Assistant, and communication documented in the PACS or zVision Dashboard. Electronically Signed   By: Suzy Bouchard M.D.   On: 08/22/2017 15:09   Dg Abd 2 Views  Result Date: 08/12/2017 CLINICAL DATA:  Bowel obstruction EXAM: ABDOMEN - 2 VIEW COMPARISON:  None. FINDINGS: Scattered gas containing small and large bowel loops without obstructive bowel gas pattern. Surgical tacks project over the mid abdomen presumably from ventral hernia repair. No free air, organomegaly nor suspicious radiopaque calculi. Lower lumbar facet arthropathy. IMPRESSION: Nonobstructed, nondistended bowel gas pattern. Electronically Signed   By: Ashley Royalty M.D.   On: 08/24/2017 19:55   Dg Abd Acute W/chest  Result Date: 08/24/2017 CLINICAL DATA:  Left lower quadrant pain EXAM: DG ABDOMEN ACUTE W/ 1V CHEST COMPARISON:  08/22/2017 FINDINGS: Cardiac shadow is within normal limits. Loop recorder is noted. Left basilar atelectatic changes and small effusion are noted increased from the previous day. Right lung remains clear. No free air is noted. Mild dilatation of the colon is noted similar to that seen on the prior CT examination. Contrast is noted within the bladder. No bony  abnormality is seen. IMPRESSION: Stable dilatation of the colon when compared with the prior exam. Increasing left basilar atelectasis and effusion. Electronically Signed   By: Inez Catalina M.D.   On: 08/22/2017 07:21    Assessment and plan- Patient is a 77 y.o. female present with abdominal pain found to have questionable bowel obstruction/abdominal carcinomatosis.  #Abdominal carcinomatosis Adhesion vs malignancy Plan obtaining tissue diagnosis severe CT biopsy.  Discussed with IR. Will be done on Monday.  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  Flex sigmoid scope did not reveal any intrinsic mass. Tumor marker pending. # bowel obstruction: continue supportive care.  Plan discussed with primary team Dr.Chen.   Thank you for allowing me to participate in the care of this patient.  Total face to face encounter time for this patient visit was 70 min. >50% of the time was  spent in counseling and coordination of care.    Earlie Server, MD, PhD Hematology Oncology [facility] Pager- [phone] 08/16/2017

## 2017-08-23 NOTE — Anesthesia Postprocedure Evaluation (Signed)
Anesthesia Post Note  Patient: Charlott Holler  Procedure(s) Performed: FLEXIBLE SIGMOIDOSCOPY (N/A )  Patient location during evaluation: Endoscopy Anesthesia Type: General Level of consciousness: awake and alert Pain management: pain level controlled Vital Signs Assessment: post-procedure vital signs reviewed and stable Respiratory status: spontaneous breathing, nonlabored ventilation, respiratory function stable and patient connected to nasal cannula oxygen Cardiovascular status: blood pressure returned to baseline and stable Postop Assessment: no apparent nausea or vomiting Anesthetic complications: no     Last Vitals:  Vitals:   08/22/2017 1502 08/05/2017 2015  BP: 134/60 (!) 146/58  Pulse: (!) 52 63  Resp: (!) 22 20  Temp: (!) 36.3 C 37.1 C  SpO2: 91% 91%    Last Pain:  Vitals:   08/13/2017 2109  TempSrc:   PainSc: 8                  Martha Clan

## 2017-08-23 NOTE — Anesthesia Post-op Follow-up Note (Signed)
Anesthesia QCDR form completed.        

## 2017-08-23 NOTE — Progress Notes (Signed)
[facility] [place] NAME: Alicia Conley    MR#:  [id number]  DATE OF BIRTH:  03-20-1940  SUBJECTIVE:  CHIEF COMPLAINT:  No chief complaint on file.  Still abdominal pain and diarrhea. REVIEW OF SYSTEMS:  Review of Systems  Constitutional: Negative for chills, fever and malaise/fatigue.  HENT: Negative for sore throat.   Eyes: Negative for blurred vision and double vision.  Respiratory: Negative for cough, hemoptysis, shortness of breath, wheezing and stridor.   Cardiovascular: Negative for chest pain, palpitations, orthopnea and leg swelling.  Gastrointestinal: Positive for abdominal pain, diarrhea and nausea. Negative for blood in stool, constipation, melena and vomiting.  Genitourinary: Negative for dysuria, flank pain and hematuria.  Musculoskeletal: Negative for back pain and joint pain.  Skin: Negative for rash.  Neurological: Negative for dizziness, sensory change, focal weakness, seizures, loss of consciousness, weakness and headaches.  Endo/Heme/Allergies: Negative for polydipsia.  Psychiatric/Behavioral: Negative for depression. The patient is not nervous/anxious.     DRUG ALLERGIES:  No Known Allergies VITALS:  Blood pressure 134/60, pulse (!) 52, temperature (!) 97.4 F (36.3 C), temperature source Oral, resp. rate (!) 22, weight 279 lb 12.2 oz (126.9 kg), SpO2 91 %. PHYSICAL EXAMINATION:  Physical Exam  Constitutional: She is oriented to person, place, and time.  Morbid obesity.  HENT:  Head: Normocephalic.  Mouth/Throat: Oropharynx is clear and moist.  Eyes: Pupils are equal, round, and reactive to light. Conjunctivae and EOM are normal. No scleral icterus.  Neck: Normal range of motion. Neck supple. No JVD present. No tracheal deviation present.  Cardiovascular: Normal rate, regular rhythm and normal heart sounds. Exam reveals no gallop.  No murmur heard. Pulmonary/Chest: Effort normal and breath sounds normal.  No respiratory distress. She has no wheezes. She has no rales.  Abdominal: Soft. Bowel sounds are normal. She exhibits distension. There is tenderness. There is no rebound and no guarding.  Musculoskeletal: Normal range of motion. She exhibits no edema or tenderness.  Neurological: She is alert and oriented to person, place, and time. No cranial nerve deficit.  Skin: No rash noted. No erythema.   LABORATORY PANEL:  Female CBC Recent Labs  Lab 08/27/2017 0422  WBC 11.5*  HGB 11.7*  HCT 35.8  PLT 358   ------------------------------------------------------------------------------------------------------------------ Chemistries  Recent Labs  Lab 08/10/2017 1847 08/11/2017 2038  08/17/2017 0422  NA 140  --    < > 143  K 2.6*  --    < > 3.5  CL 105  --    < > 112*  CO2 24  --    < > 22  GLUCOSE 114*  --    < > 80  BUN 27*  --    < > 17  CREATININE 1.18*  --    < > 0.88  CALCIUM 9.1  --    < > 8.0*  MG  --  2.1  --   --   AST 48*  --   --   --   ALT 30  --   --   --   ALKPHOS 58  --   --   --   BILITOT 0.6  --   --   --    < > = values in this interval not displayed.   RADIOLOGY:  Dg Abd Acute W/chest  Result Date: 08/18/2017 CLINICAL DATA:  Left lower quadrant pain EXAM: DG ABDOMEN ACUTE W/ 1V CHEST COMPARISON:  08/22/2017 FINDINGS: Cardiac shadow is  within normal limits. Loop recorder is noted. Left basilar atelectatic changes and small effusion are noted increased from the previous day. Right lung remains clear. No free air is noted. Mild dilatation of the colon is noted similar to that seen on the prior CT examination. Contrast is noted within the bladder. No bony abnormality is seen. IMPRESSION: Stable dilatation of the colon when compared with the prior exam. Increasing left basilar atelectasis and effusion. Electronically Signed   By: Inez Catalina M.D.   On: 08/08/2017 07:21   ASSESSMENT AND PLAN:   77 year old female patient with history of hypertension, hyperlipidemia,  hypothyroidism referred from GI clinic for direct admission for abdominal discomfort.  Obstipation Abdominal CT carcinomatosis. Sigmoidoscopy per Dr. Dahlia Byes. Inadequate preparation for sigmoidoscopy per Dr. Alice Reichert GI. Keep n.p.o. per Dr. Dahlia Byes. Follow-up oncology consult.   -Acute Hypokalemia Replaced potassium IV and improved.  -Hypertension Resume metoprolol orally if no ileus  -Hyperlipidemia Resume statin orally if no ileus  -Dehydration Improved with IV fluids  I discussed with Dr. Dahlia Byes. All the records are reviewed and case discussed with Care Management/Social Worker. Management plans discussed with the patient, her husband and they are in agreement.  CODE STATUS: Full Code  TOTAL TIME TAKING CARE OF THIS PATIENT: 33 minutes.   More than 50% of the time was spent in counseling/coordination of care: YES  POSSIBLE D/C IN 2 DAYS, DEPENDING ON CLINICAL CONDITION.   [name] M.D on 08/05/2017 at 3:11 PM  Between 7am to 6pm - Pager - [phone]  After 6pm go to [email or web address] - [facility]

## 2017-08-23 NOTE — Op Note (Signed)
[facility] Gastroenterology Patient Name: Alicia Conley Procedure Date: 08/27/2017 1:45 PM MRN: [id number] Account #: [id number] Date of Birth: Jan 07, 1941 Admit Type: Inpatient Age: 77 Room: [facility] ENDO ROOM 2 Gender: Female Note Status: Finalized Procedure:            Flexible Sigmoidoscopy Indications:          Abdominal pain in the left lower quadrant, Abnormal CT                        of the GI tract Providers:            Benay Pike. Alice Reichert MD, MD Referring MD:         [name] MD, MD (Referring MD) Medicines:            Propofol per Anesthesia, None Complications:        No immediate complications. Procedure:            Pre-Anesthesia Assessment:                       - The risks and benefits of the procedure and the                        sedation options and risks were discussed with the                        patient. All questions were answered and informed                        consent was obtained.                       - Patient identification and proposed procedure were                        verified prior to the procedure by the nurse. The                        procedure was verified in the procedure room.                       - ASA Grade Assessment: III - A patient with severe                        systemic disease.                       - After reviewing the risks and benefits, the patient                        was deemed in satisfactory condition to undergo the                        procedure.                       After obtaining informed consent, the scope was passed                        under direct vision. The Endoscope was introduced  through the anus and advanced to the the sigmoid colon.                        The flexible sigmoidoscopy was technically difficult                        and complex due to bowel stenosis, inadequate bowel                        prep, poor endoscopic visualization and  restricted                        mobility of the colon. Successful completion of the                        procedure was aided by withdrawing and reinserting the                        scope and applying abdominal pressure. The quality of                        the bowel preparation was inadequate. Findings:      Copious quantities of liquid stool was found in the sigmoid colon,       interfering with visualization. Lavage of the area was performed using       50 - 200 mL of sterile water, resulting in incomplete clearance with       fair visualization.      A benign-appearing, intrinsic severe stenosis measuring 3 cm (in length)       x 9 mm (inner diameter) was found in the sigmoid colon and was       traversed. No intrinsic mass was identified. There was extrinsic       compression.      The lumen of the proximal sigmoid colon was moderately dilated.      Non-bleeding internal hemorrhoids were found during retroflexion. The       hemorrhoids were Grade I (internal hemorrhoids that do not prolapse).      Many small-mouthed diverticula were found in the sigmoid colon. Impression:           - Preparation of the colon was inadequate.                       - Stool in the sigmoid colon.                       - Stricture in the sigmoid colon.                       - Dilated in the proximal sigmoid colon.                       - Non-bleeding internal hemorrhoids.                       - Diverticulosis in the sigmoid colon.                       - No specimens collected. Recommendation:       - Further recommendations per oncology Procedure Code(s):    --- Professional ---  45997, Sigmoidoscopy, flexible; diagnostic, including                        collection of specimen(s) by brushing or washing, when                        performed (separate procedure) Diagnosis Code(s):    --- Professional ---                       K64.0, First degree hemorrhoids                        K56.699, Other intestinal obstruction unspecified as to                        partial versus complete obstruction                       K59.39, Other megacolon                       R10.32, Left lower quadrant pain                       K57.30, Diverticulosis of large intestine without                        perforation or abscess without bleeding                       R93.3, Abnormal findings on diagnostic imaging of other                        parts of digestive tract CPT copyright 2017 American [employer]. All rights reserved. The codes documented in this report are preliminary and upon coder review may  be revised to meet current compliance requirements. Efrain Sella MD, MD 08/07/2017 2:14:28 PM This report has been signed electronically. Number of Addenda: 0 Note Initiated On: 08/29/2017 1:45 PM Total Procedure Duration: 0 hours 9 minutes 46 seconds       [facility]

## 2017-08-23 NOTE — Care Management Important Message (Signed)
Copy of signed IM left with patient in room.  

## 2017-08-23 NOTE — Anesthesia Preprocedure Evaluation (Signed)
Anesthesia Evaluation  Patient identified by MRN, date of birth, ID band Patient awake    Reviewed: Allergy & Precautions, H&P , NPO status , Patient's Chart, lab work & pertinent test results, reviewed documented beta blocker date and time   History of Anesthesia Complications Negative for: history of anesthetic complications  Airway Mallampati: II  TM Distance: >3 FB Neck ROM: full    Dental  (+) Dental Advidsory Given, Edentulous Upper, Edentulous Lower, Upper Dentures, Lower Dentures   Pulmonary neg shortness of breath, sleep apnea and Continuous Positive Airway Pressure Ventilation , neg COPD, neg recent URI,           Cardiovascular Exercise Tolerance: Good hypertension, (-) angina(-) CAD, (-) Past MI, (-) Cardiac Stents and (-) CABG (-) dysrhythmias (-) Valvular Problems/Murmurs     Neuro/Psych negative neurological ROS  negative psych ROS   GI/Hepatic Neg liver ROS, GERD  ,  Endo/Other  neg diabetesHypothyroidism Morbid obesity  Renal/GU negative Renal ROS  negative genitourinary   Musculoskeletal   Abdominal   Peds  Hematology negative hematology ROS (+)   Anesthesia Other Findings Past Medical History: No date: High cholesterol No date: Hypertension No date: Thyroid disease   Reproductive/Obstetrics negative OB ROS                             Anesthesia Physical Anesthesia Plan  ASA: III  Anesthesia Plan: General   Post-op Pain Management:    Induction: Intravenous  PONV Risk Score and Plan: 3 and Propofol infusion  Airway Management Planned: Nasal Cannula  Additional Equipment:   Intra-op Plan:   Post-operative Plan:   Informed Consent: I have reviewed the patients History and Physical, chart, labs and discussed the procedure including the risks, benefits and alternatives for the proposed anesthesia with the patient or authorized representative who has indicated  his/her understanding and acceptance.   Dental Advisory Given  Plan Discussed with: Anesthesiologist, CRNA and Surgeon  Anesthesia Plan Comments:         Anesthesia Quick Evaluation

## 2017-08-23 NOTE — Progress Notes (Signed)
Pt transported to room by transport in stable condition.Pt family at bedside. Report called to receiving RN.

## 2017-08-23 NOTE — Progress Notes (Signed)
Pharmacy consulted for electrolyte replacement protocol:   Goal of therapy: Electrolytes within normal limits:  K 3.5 - 5.1 Corrected Ca 8.9 - 10.3 Phos 2.5 - 4.6 Mg 1.7 - 2.4   Assessment: Lab Results  Component Value Date   CREATININE 0.88 08/20/2017   BUN 17 08/27/2017   NA 143 08/17/2017   K 3.5 08/13/2017   CL 112 (H) 08/29/2017   CO2 22 08/22/2017    Plan: K 2.6, ordering KCl 22meq iv q1h x 5 doses. Will reheck with AM labs. Will order Magnesium now and replace as necessary for hypomagnesia. Will order am labs per protocol.  6/19  Mag @ 20:30 = 2.1 No additional mag needed, will recheck electrolytes on 6/20 with AM labs  6/20 K 3.2, Normally would prefer po potassium replacement at this point but she remains NPO I will order KCl 34mEq IV q1h x 3 doses and follow K level in the morning 6/21  6/21, K 3.5, hypokalemia resolved following replacement therapy. However, patient is still having multiple very loose stools per day. I will order a follow-up BMP for 6/22 am  Dallie Piles, PharmD Clinical Pharmacist [facility]

## 2017-08-23 NOTE — Interval H&P Note (Signed)
History and Physical Interval Note:  08/12/2017 1:43 PM  Alicia Conley  has presented today for surgery, with the diagnosis of Abdominal Pain; Abnormal CT  The various methods of treatment have been discussed with the patient and family. After consideration of risks, benefits and other options for treatment, the patient has consented to  Procedure(s): FLEXIBLE SIGMOIDOSCOPY (N/A) as a surgical intervention .  The patient's history has been reviewed, patient examined, no change in status, stable for surgery.  I have reviewed the patient's chart and labs.  Questions were answered to the patient's satisfaction.     [place], [place]

## 2017-08-24 ENCOUNTER — Inpatient Hospital Stay: Payer: Medicare HMO

## 2017-08-24 LAB — BASIC METABOLIC PANEL
Anion gap: 6 (ref 5–15)
BUN: 13 mg/dL (ref 6–20)
CALCIUM: 7.8 mg/dL — AB (ref 8.9–10.3)
CHLORIDE: 114 mmol/L — AB (ref 101–111)
CO2: 21 mmol/L — AB (ref 22–32)
CREATININE: 0.83 mg/dL (ref 0.44–1.00)
GFR calc non Af Amer: >60 mL/min (ref >60)
Glucose, Bld: 76 mg/dL (ref 65–99)
Potassium: 2.9 mmol/L — ABNORMAL LOW (ref 3.5–5.1)
SODIUM: 141 mmol/L (ref 135–145)

## 2017-08-24 LAB — CA 125: CANCER ANTIGEN (CA) 125: 93.8 U/mL — AB (ref 0.0–38.1)

## 2017-08-24 LAB — CBC
HCT: 36.7 % (ref 35.0–47.0)
Hemoglobin: 12.2 g/dL (ref 12.0–16.0)
MCH: 28.8 pg (ref 26.0–34.0)
MCHC: 33.2 g/dL (ref 32.0–36.0)
MCV: 86.9 fL (ref 80.0–100.0)
PLATELETS: 342 10*3/uL (ref 150–440)
RBC: 4.23 MIL/uL (ref 3.80–5.20)
RDW: 15.2 % — ABNORMAL HIGH (ref 11.5–14.5)
WBC: 10.8 10*3/uL (ref 3.6–11.0)

## 2017-08-24 LAB — CEA: CEA: 2.8 ng/mL (ref 0.0–4.7)

## 2017-08-24 MED ORDER — POTASSIUM CHLORIDE CRYS ER 20 MEQ PO TBCR: 40.0000 meq | EXTENDED_RELEASE_TABLET | Freq: Two times a day (BID) | ORAL | Status: DC

## 2017-08-24 MED ORDER — POTASSIUM CHLORIDE IN NACL 20-0.45 MEQ/L-% IV SOLN
INTRAVENOUS | Status: AC
  Administered 2017-08-24 – 2017-08-25 (×2): via INTRAVENOUS
  Administered 2017-08-25: 1000 mL via INTRAVENOUS
  Filled 2017-08-24 (×3): qty 1000

## 2017-08-24 MED ORDER — POTASSIUM CHLORIDE 10 MEQ/100ML IV SOLN
10.0000 meq | INTRAVENOUS | Status: AC
  Administered 2017-08-24 (×6): 10 meq via INTRAVENOUS
  Filled 2017-08-24 (×7): qty 100

## 2017-08-24 MED ORDER — SODIUM CHLORIDE 0.45 % IV SOLN: INTRAVENOUS | Status: DC

## 2017-08-24 MED ORDER — SODIUM CHLORIDE 0.45 % IV SOLN
INTRAVENOUS | Status: DC
  Filled 2017-08-24 (×2): qty 1000

## 2017-08-24 MED ORDER — SODIUM CHLORIDE 0.9 % IV SOLN
2.0000 g | Freq: Four times a day (QID) | INTRAVENOUS | Status: DC
  Administered 2017-08-24 – 2017-08-28 (×14): 2 g via INTRAVENOUS
  Filled 2017-08-24 (×19): qty 2

## 2017-08-24 NOTE — Progress Notes (Signed)
D/W oncology in detail. Given uncertainty of disease and not readily access to [facility] oncology I do think that the safest options for her is to do upfront diverting colostomy to prevent complete obstruction.  We will plan to perform it in am.

## 2017-08-24 NOTE — Progress Notes (Signed)
CC: carcinomatosis Subjective:  Feeling ok, some persistent pain, not worst than yesterday. + Flatus and liquid BM No emesis  Objective: Vital signs in last 24 hours: Temp:  [96.2 F (35.7 C)-98.7 F (37.1 C)] 97.5 F (36.4 C) (06/22 0511) Pulse Rate:  [52-69] 57 (06/22 0511) Resp:  [14-22] 20 (06/22 0511) BP: (134-175)/(58-117) 150/68 (06/22 0511) SpO2:  [91 %-98 %] 95 % (06/22 0511) Weight:  [126.9 kg (279 lb 12.2 oz)] 126.9 kg (279 lb 12.2 oz) (06/21 1714) Last BM Date: 08/19/2017  Intake/Output from previous day: 06/21 0701 - 06/22 0700 In: 50 [I.V.:50] Out: 500 [Urine:300; Stool:200] Intake/Output this shift: No intake/output data recorded.  Physical exam: NAD, morbidly obese Abd: soft, mild tenderness, no peritonitis. No rebound Ext: well perfused and warm  Lab Results: CBC  Recent Labs    08/30/2017 0422 08/24/17 0423  WBC 11.5* 10.8  HGB 11.7* 12.2  HCT 35.8 36.7  PLT 358 342   BMET Recent Labs    08/27/2017 0422 08/24/17 0423  NA 143 141  K 3.5 2.9*  CL 112* 114*  CO2 22 21*  GLUCOSE 80 76  BUN 17 13  CREATININE 0.88 0.83  CALCIUM 8.0* 7.8*   PT/INR Recent Labs    08/17/2017 1847  LABPROT 13.6  INR 1.05   ABG No results for input(s): PHART, HCO3 in the last 72 hours.  Invalid input(s): PCO2, PO2  Studies/Results: Ct Abdomen Pelvis W Contrast  Result Date: 08/22/2017 CLINICAL DATA:  Follow-up CT scan concerning for bowel obstruction. LEFT lower quadrant pain abdominal pain for 2 weeks. Last bowel movement 1 week ago. EXAM: CT ABDOMEN AND PELVIS WITH CONTRAST TECHNIQUE: Multidetector CT imaging of the abdomen and pelvis was performed using the standard protocol following bolus administration of intravenous contrast. CONTRAST:  18mL ISOVUE-300 IOPAMIDOL (ISOVUE-300) INJECTION 61% COMPARISON:  CT 11/28/2013 FINDINGS: Lower chest: Lung bases are clear. Hepatobiliary: Small nodular densities along the surface of the RIGHT hepatic lobe (image 28/2  image 33/2). Nodules measure from 3 to 5 mm are partially calcified. Sludge or stones in the gallbladder. Small amount pericholecystic fluid adjacent to fundus of the bladder. No biliary duct dilatation. The low several low-density cysts in the liver. Pancreas: Pancreas is normal. No ductal dilatation. No pancreatic inflammation. Spleen: Normal spleen Adrenals/urinary tract: Adrenal glands are normal. Several intermediate density or enhancing lesions the renal cortex. For example 14 mm lesion along the lateral margin of theRIGHT lower pole (image 44/2). Cortical lesion measuring 18 mm on the anterior upper pole on the LEFT (image 24/2). There is mild hydroureter on the RIGHT hydroureter extends to the level of the pelvis. There is peritoneal nodularity in the pelvis mesentery measuring 18 mm appears to be fanning out in the colonic mesentery (image 75/2.) This may obstruct the ureter. Bladder is normal Stomach/Bowel: Stomach, duodenum and small bowel are normal. The ascending, transverse and descending colon are fluid-filled and without haustral markings. This fluid-filled colon extends the level sigmoid colon with there is caliber change. This is in the region of the mesenteric nodularity described renal section. The sigmoid colon is narrowed with potential calcified peritoneal densities (image 73/2). Rectum normal Vascular/Lymphatic: Abdominal aorta is normal caliber. No periportal or retroperitoneal adenopathy. No pelvic adenopathy. Reproductive: Uterus normal.  Ovaries not identified Other: There is a ventral hernia repair. There is enhancing nodularity along the ventral peritoneal surface with a frondlike appearance (image 40/2 for example). Small frondlike cluster measures 21 mm (image 40/2). This is similar to the  mesenteric nodularity in the pelvis as well as the nodularity along the RIGHT hepatic lobe. Small calcified nodules also extends along the LEFT pericolic gutter. Musculoskeletal: No aggressive  osseous lesion. IMPRESSION: 1. The ascending, transverse and descending colon is ahaustral, fluid-filled, and mildly distended to the level of sigmoid colon. A caliber change at sigmoid colon with serosal nodularity concerning for obstruction from peritoneal carcinomatosis. 2. Nodular lesions which are partially calcified along the hepatic margin, pelvic sigmoid mesocolon pelvis as well as the ventral peritoneal space. 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 Partially calcified nodularity suggest potential ovarian source. The ventral midline peritoneal nodularity may most assessable for biopsy. 3. Mild entrapment of the distal RIGHT ureter associated with the mesenteric nodularity. 4. Indeterminate renal lesions. Recommend follow-up MRI renal protocol at some point. These results will be called to the ordering clinician or representative by the Radiologist Assistant, and communication documented in the PACS or zVision Dashboard. Electronically Signed   By: Suzy Bouchard M.D.   On: 08/22/2017 15:09   Dg Abd Acute W/chest  Result Date: 08/24/2017 CLINICAL DATA:  Lower abdominal pain. EXAM: DG ABDOMEN ACUTE W/ 1V CHEST COMPARISON:  Chest and abdominal x-rays from yesterday. FINDINGS: Stable cardiomediastinal silhouette. Loop recorder again noted. Mild pulmonary vascular congestion, slightly more conspicuous on today's study. Unchanged small left pleural effusion and left basilar atelectasis. No pneumothorax. Stable mild dilatation of the colon. No pneumoperitoneum. No acute osseous abnormality. IMPRESSION: 1. Stable mild colonic dilatation. 2. Unchanged small left pleural effusion and left basilar atelectasis. Electronically Signed   By: Titus Dubin M.D.   On: 08/24/2017 08:44   Dg Abd Acute W/chest  Result Date: 08/31/2017 CLINICAL DATA:  Left lower quadrant pain EXAM: DG ABDOMEN ACUTE W/ 1V CHEST COMPARISON:  08/22/2017 FINDINGS: Cardiac shadow is within normal limits. Loop recorder is  noted. Left basilar atelectatic changes and small effusion are noted increased from the previous day. Right lung remains clear. No free air is noted. Mild dilatation of the colon is noted similar to that seen on the prior CT examination. Contrast is noted within the bladder. No bony abnormality is seen. IMPRESSION: Stable dilatation of the colon when compared with the prior exam. Increasing left basilar atelectasis and effusion. Electronically Signed   By: Inez Catalina M.D.   On: 09/01/2017 07:21    Anti-infectives: Anti-infectives (From admission, onward)   None      Assessment/Plan: Carcinomatosis w partial large bowel obstruction Passing flatus and having liquid BM, scope able to traverse the stenotic segment of sigmoid colon ( external compression). No evidence of complete bowel obstruction that will require urgent colonic diversion. Awaiting for oncology recs, I do recommend [facility] consult, if operative intervention is attempted by them en bloc resection including sigmoid should be attempted. We will continue to follow her to make sure she does not develop a complete large bowel obstruction  Caroleen Hamman, MD, FACS  08/24/2017

## 2017-08-24 NOTE — Progress Notes (Signed)
She has received 6 runs of KCL 10MEq this shift. She tol well.

## 2017-08-24 NOTE — Progress Notes (Signed)
[place] [facility] [place] NAME: Alicia Conley    MR#:  [id number]  DATE OF BIRTH:  [birth year]  SUBJECTIVE:  CHIEF COMPLAINT:  No chief complaint on file.  Patient complains of nausea only, husband at the bedside, case discussed with general surgery REVIEW OF SYSTEMS:  CONSTITUTIONAL: No fever, fatigue or weakness.  EYES: No blurred or double vision.  EARS, NOSE, AND THROAT: No tinnitus or ear pain.  RESPIRATORY: No cough, shortness of breath, wheezing or hemoptysis.  CARDIOVASCULAR: No chest pain, orthopnea, edema.  GASTROINTESTINAL: No nausea, vomiting, diarrhea or abdominal pain.  GENITOURINARY: No dysuria, hematuria.  ENDOCRINE: No polyuria, nocturia,  HEMATOLOGY: No anemia, easy bruising or bleeding SKIN: No rash or lesion. MUSCULOSKELETAL: No joint pain or arthritis.   NEUROLOGIC: No tingling, numbness, weakness.  PSYCHIATRY: No anxiety or depression.   ROS  DRUG ALLERGIES:  No Known Allergies  VITALS:  Blood pressure (!) 150/68, pulse (!) 57, temperature (!) 97.5 F (36.4 C), temperature source Oral, resp. rate 20, height 5\' 5"  (1.651 m), weight 126.9 kg (279 lb 12.2 oz), SpO2 95 %.  PHYSICAL EXAMINATION:  GENERAL:  77 y.o.-year-old patient lying in the bed with no acute distress.  EYES: Pupils equal, round, reactive to light and accommodation. No scleral icterus. Extraocular muscles intact.  HEENT: Head atraumatic, normocephalic. Oropharynx and nasopharynx clear.  NECK:  Supple, no jugular venous distention. No thyroid enlargement, no tenderness.  LUNGS: Normal breath sounds bilaterally, no wheezing, rales,rhonchi or crepitation. No use of accessory muscles of respiration.  CARDIOVASCULAR: S1, S2 normal. No murmurs, rubs, or gallops.  ABDOMEN: Soft, nontender, nondistended. Bowel sounds present. No organomegaly or mass.  EXTREMITIES: No pedal edema, cyanosis, or clubbing.  NEUROLOGIC: Cranial nerves II through XII are intact.  Muscle strength 5/5 in all extremities. Sensation intact. Gait not checked.  PSYCHIATRIC: The patient is alert and oriented x 3.  SKIN: No obvious rash, lesion, or ulcer.   Physical Exam LABORATORY PANEL:   CBC Recent Labs  Lab 08/24/17 0423  WBC 10.8  HGB 12.2  HCT 36.7  PLT 342   ------------------------------------------------------------------------------------------------------------------  Chemistries  Recent Labs  Lab 08/27/2017 1847 08/27/2017 2038  08/24/17 0423  NA 140  --    < > 141  K 2.6*  --    < > 2.9*  CL 105  --    < > 114*  CO2 24  --    < > 21*  GLUCOSE 114*  --    < > 76  BUN 27*  --    < > 13  CREATININE 1.18*  --    < > 0.83  CALCIUM 9.1  --    < > 7.8*  MG  --  2.1  --   --   AST 48*  --   --   --   ALT 30  --   --   --   ALKPHOS 58  --   --   --   BILITOT 0.6  --   --   --    < > = values in this interval not displayed.   ------------------------------------------------------------------------------------------------------------------  Cardiac Enzymes No results for input(s): TROPONINI in the last 168 hours. ------------------------------------------------------------------------------------------------------------------  RADIOLOGY:  Ct Abdomen Pelvis W Contrast  Result Date: 08/22/2017 CLINICAL DATA:  Follow-up CT scan concerning for bowel obstruction. LEFT lower quadrant pain abdominal pain for 2 weeks. Last bowel movement 1 week ago. EXAM: CT ABDOMEN AND PELVIS WITH CONTRAST  TECHNIQUE: Multidetector CT imaging of the abdomen and pelvis was performed using the standard protocol following bolus administration of intravenous contrast. CONTRAST:  42mL ISOVUE-300 IOPAMIDOL (ISOVUE-300) INJECTION 61% COMPARISON:  CT 11/28/2013 FINDINGS: Lower chest: Lung bases are clear. Hepatobiliary: Small nodular densities along the surface of the RIGHT hepatic lobe (image 28/2 image 33/2). Nodules measure from 3 to 5 mm are partially calcified. Sludge or stones in  the gallbladder. Small amount pericholecystic fluid adjacent to fundus of the bladder. No biliary duct dilatation. The low several low-density cysts in the liver. Pancreas: Pancreas is normal. No ductal dilatation. No pancreatic inflammation. Spleen: Normal spleen Adrenals/urinary tract: Adrenal glands are normal. Several intermediate density or enhancing lesions the renal cortex. For example 14 mm lesion along the lateral margin of theRIGHT lower pole (image 44/2). Cortical lesion measuring 18 mm on the anterior upper pole on the LEFT (image 24/2). There is mild hydroureter on the RIGHT hydroureter extends to the level of the pelvis. There is peritoneal nodularity in the pelvis mesentery measuring 18 mm appears to be fanning out in the colonic mesentery (image 75/2.) This may obstruct the ureter. Bladder is normal Stomach/Bowel: Stomach, duodenum and small bowel are normal. The ascending, transverse and descending colon are fluid-filled and without haustral markings. This fluid-filled colon extends the level sigmoid colon with there is caliber change. This is in the region of the mesenteric nodularity described renal section. The sigmoid colon is narrowed with potential calcified peritoneal densities (image 73/2). Rectum normal Vascular/Lymphatic: Abdominal aorta is normal caliber. No periportal or retroperitoneal adenopathy. No pelvic adenopathy. Reproductive: Uterus normal.  Ovaries not identified Other: There is a ventral hernia repair. There is enhancing nodularity along the ventral peritoneal surface with a frondlike appearance (image 40/2 for example). Small frondlike cluster measures 21 mm (image 40/2). This is similar to the mesenteric nodularity in the pelvis as well as the nodularity along the RIGHT hepatic lobe. Small calcified nodules also extends along the LEFT pericolic gutter. Musculoskeletal: No aggressive osseous lesion. IMPRESSION: 1. The ascending, transverse and descending colon is ahaustral,  fluid-filled, and mildly distended to the level of sigmoid colon. A caliber change at sigmoid colon with serosal nodularity concerning for obstruction from peritoneal carcinomatosis. 2. Nodular lesions which are partially calcified along the hepatic margin, pelvic sigmoid mesocolon pelvis as well as the ventral peritoneal space. 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 Partially calcified nodularity suggest potential ovarian source. The ventral midline peritoneal nodularity may most assessable for biopsy. 3. Mild entrapment of the distal RIGHT ureter associated with the mesenteric nodularity. 4. Indeterminate renal lesions. Recommend follow-up MRI renal protocol at some point. These results will be called to the ordering clinician or representative by the Radiologist Assistant, and communication documented in the PACS or zVision Dashboard. Electronically Signed   By: Suzy Bouchard M.D.   On: 08/22/2017 15:09   Dg Abd Acute W/chest  Result Date: 08/24/2017 CLINICAL DATA:  Lower abdominal pain. EXAM: DG ABDOMEN ACUTE W/ 1V CHEST COMPARISON:  Chest and abdominal x-rays from yesterday. FINDINGS: Stable cardiomediastinal silhouette. Loop recorder again noted. Mild pulmonary vascular congestion, slightly more conspicuous on today's study. Unchanged small left pleural effusion and left basilar atelectasis. No pneumothorax. Stable mild dilatation of the colon. No pneumoperitoneum. No acute osseous abnormality. IMPRESSION: 1. Stable mild colonic dilatation. 2. Unchanged small left pleural effusion and left basilar atelectasis. Electronically Signed   By: Titus Dubin M.D.   On: 08/24/2017 08:44   Dg Abd Acute W/chest  Result Date: 08/30/2017  CLINICAL DATA:  Left lower quadrant pain EXAM: DG ABDOMEN ACUTE W/ 1V CHEST COMPARISON:  08/22/2017 FINDINGS: Cardiac shadow is within normal limits. Loop recorder is noted. Left basilar atelectatic changes and small effusion are noted increased from the previous  day. Right lung remains clear. No free air is noted. Mild dilatation of the colon is noted similar to that seen on the prior CT examination. Contrast is noted within the bladder. No bony abnormality is seen. IMPRESSION: Stable dilatation of the colon when compared with the prior exam. Increasing left basilar atelectasis and effusion. Electronically Signed   By: Inez Catalina M.D.   On: 08/30/2017 07:21    ASSESSMENT AND PLAN:  77 year old female patient with history of hypertension, hyperlipidemia, hypothyroidism referred from GI clinic for direct admission for abdominal discomfort.  *Acute carcinomatosis Etiology unknown Status post flexible sigmoidoscopy noted for internal hemorrhoids/diverticulosis August 23, 2017 For CT-guided biopsy on Monday per Dr. Tasia Catchings by interventional radiology  *Acute Obstipation Compared to above Small bowel movement status post flexible sigmoidoscopy by general surgery/Dr. Dahlia Byes.   *Acute Hypokalemia Replete with potassium, check magnesium level, BMP in the morning   *Chronic benign essential hypertension Stable  Continue current regiment  * Chronic Hyperlipidemia Stable Resume statin upon discharge   *Acute dehydration Resolved with IV fluids for rehydration    Prognosis very poor    All the records are reviewed and case discussed with Care Management/Social Workerr. Management plans discussed with the patient, family and they are in agreement.  CODE STATUS: full  TOTAL TIME TAKING CARE OF THIS PATIENT: 35 minutes.     POSSIBLE D/C IN 3-5 DAYS, DEPENDING ON CLINICAL CONDITION.   Avel Peace Jaunita Mikels M.D on 08/24/2017   Between 7am to 6pm - Pager - [phone]  After 6pm go to [email or web address] - password [email or web address] [facility]  Office  [phone]  CC: Primary care physician; [name], MD  Note: This dictation was prepared with Dragon dictation along with smaller phrase technology. Any transcriptional errors that  result from this process are unintentional.

## 2017-08-24 NOTE — Progress Notes (Signed)
Called Dr. Jodell Cipro regarding potassium level of 2.9.  Appropriate orders were placed.  Christene Slates  08/24/2017  5:42 AM

## 2017-08-24 NOTE — Progress Notes (Signed)
Hematology/Oncology Progress Note [facility] [phone] [phone]  Patient Care Team: [name], MD as PCP - General (Family Medicine)   Name of the patient: Alicia Conley  [id number]  10/16/1940  Date of visit: 08/24/17   INTERVAL HISTORY-  Patient was seen and examined.  Feels "same as yesterday".  Still has abdominal pain.  No nausea vomiting.  Able to drink some liquid diet. Family members at bedside.  Review of Systems  Constitutional: Positive for malaise/fatigue.  HENT: Negative for congestion.   Eyes: Negative for double vision, photophobia and pain.  Respiratory: Negative for shortness of breath.   Cardiovascular: Negative for chest pain, claudication and leg swelling.  Gastrointestinal: Positive for abdominal pain. Negative for nausea and vomiting.  Genitourinary: Negative for dysuria.  Skin: Negative for rash.  Neurological: Negative for headaches.  Psychiatric/Behavioral: The patient is nervous/anxious.     No Known Allergies  Patient Active Problem List   Diagnosis Date Noted  . Large bowel obstruction ([place])   . 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 Abdominal pain 08/31/2017  . Ileus ([place]) 08/29/2017     Past Medical History:  Diagnosis Date  . High cholesterol   . Hypertension   . Thyroid disease      Past Surgical History:  Procedure Laterality Date  . BREAST BIOPSY Left 11/07/12   u/s bx lt -neg  . BREAST CYST ASPIRATION Left    neg  . EP IMPLANTABLE DEVICE N/A 04/03/2016   Procedure: Loop Recorder Insertion;  Surgeon: Isaias Cowman, MD;  Location: [place] [facility];  Service: Cardiovascular;  Laterality: N/A;  . FLEXIBLE SIGMOIDOSCOPY N/A 08/26/2017   Procedure: FLEXIBLE SIGMOIDOSCOPY;  Surgeon: Toledo, Benay Pike, MD;  Location: [facility] ENDOSCOPY;  Service: Gastroenterology;  Laterality: N/A;  . HERNIA REPAIR    . OOPHORECTOMY Bilateral     Social History   Socioeconomic History  .  Marital status: Married    Spouse name: Not on file  . Number of children: Not on file  . Years of education: Not on file  . Highest education level: Not on file  Occupational History  . Not on file  Social Needs  . Financial resource strain: Not on file  . Food insecurity:    Worry: Not on file    Inability: Not on file  . Transportation needs:    Medical: Not on file    Non-medical: Not on file  Tobacco Use  . Smoking status: Never Smoker  . Smokeless tobacco: Never Used  Substance and Sexual Activity  . Alcohol use: Never    Frequency: Never  . Drug use: Never  . Sexual activity: Never  Lifestyle  . Physical activity:    Days per week: Not on file    Minutes per session: Not on file  . Stress: Not on file  Relationships  . Social connections:    Talks on phone: Not on file    Gets together: Not on file    Attends religious service: Not on file    Active member of club or organization: Not on file    Attends meetings of clubs or organizations: Not on file    Relationship status: Not on file  . Intimate partner violence:    Fear of current or ex partner: Not on file    Emotionally abused: Not on file    Physically abused: Not on file    Forced sexual activity: Not on file  Other Topics Concern  .  Not on file  Social History Narrative  . Not on file     Family History  Problem Relation Age of Onset  . Breast cancer Paternal Aunt 58  . Breast cancer Cousin 61       mat cousin     Current Facility-Administered Medications:  .  0.45 % NaCl with KCl 20 mEq / L infusion, , Intravenous, Continuous, Salary, Montell D, MD, Last Rate: 75 mL/hr at 08/24/17 1116 .  acetaminophen (TYLENOL) tablet 650 mg, 650 mg, Oral, Q6H PRN, 650 mg at 08/22/17 1255 **OR** acetaminophen (TYLENOL) suppository 650 mg, 650 mg, Rectal, Q6H PRN, Pyreddy, Pavan, MD .  alum & mag hydroxide-simeth (MAALOX/MYLANTA) 200-200-20 MG/5ML suspension 15 mL, 15 mL, Oral, Q4H PRN, [name], MD .   aspirin EC tablet 81 mg, 81 mg, Oral, Daily, Pyreddy, Pavan, MD, 81 mg at 08/24/17 1129 .  atorvastatin (LIPITOR) tablet 80 mg, 80 mg, Oral, Daily, Pyreddy, Pavan, MD, 80 mg at 08/24/17 1130 .  bisacodyl (DULCOLAX) suppository 10 mg, 10 mg, Rectal, Daily PRN, Pyreddy, Pavan, MD .  cefOXitin (MEFOXIN) 2 g in sodium chloride 0.9 % 100 mL IVPB, 2 g, Intravenous, Q6H, Pabon, Diego F, MD .  enoxaparin (LOVENOX) injection 40 mg, 40 mg, Subcutaneous, Q24H, [name], MD, 40 mg at 08/24/17 1124 .  famotidine (PEPCID) IVPB 20 mg premix, 20 mg, Intravenous, Q24H, Pyreddy, Reatha Harps, MD, Stopped at 08/14/2017 2000 .  feeding supplement (BOOST / RESOURCE BREEZE) liquid 1 Container, 1 Container, Oral, TID WC, [name], MD, 1 Container at 08/24/17 1130 .  fluticasone (FLONASE) 50 MCG/ACT nasal spray 1 spray, 1 spray, Each Nare, Daily PRN, Pyreddy, Pavan, MD .  hydrALAZINE (APRESOLINE) injection 10 mg, 10 mg, Intravenous, Q4H PRN, Pyreddy, Pavan, MD, 10 mg at 08/22/17 0422 .  levothyroxine (SYNTHROID, LEVOTHROID) tablet 75 mcg, 75 mcg, Oral, QAC breakfast, Pyreddy, Pavan, MD, 75 mcg at 08/24/17 1131 .  metoprolol tartrate (LOPRESSOR) tablet 100 mg, 100 mg, Oral, BID, Pyreddy, Pavan, MD, 100 mg at 08/24/17 1133 .  morphine 2 MG/ML injection 2 mg, 2 mg, Intravenous, Q4H PRN, Pyreddy, Pavan, MD, 2 mg at 08/24/17 1643 .  ondansetron (ZOFRAN) tablet 4 mg, 4 mg, Oral, Q6H PRN **OR** ondansetron (ZOFRAN) injection 4 mg, 4 mg, Intravenous, Q6H PRN, Pyreddy, Pavan, MD, 4 mg at 08/22/17 0743 .  sertraline (ZOLOFT) tablet 100 mg, 100 mg, Oral, Daily, Pyreddy, Pavan, MD, 100 mg at 08/24/17 1133   Physical exam:  Vitals:   08/12/2017 1714 08/20/2017 2015 08/24/17 0511 08/24/17 1301  BP:  (!) 146/58 (!) 150/68 (!) 154/70  Pulse:  63 (!) 57 (!) 56  Resp:  20 20 14   Temp:  98.7 F (37.1 C) (!) 97.5 F (36.4 C) (!) 97.5 F (36.4 C)  TempSrc:  Oral Oral Oral  SpO2:  91% 95% 95%  Weight: 279 lb 12.2 oz (126.9 kg)     Height: 5'  5" (1.651 m)      Physical Exam  Constitutional: She is oriented to person, place, and time. No distress.  Obese  HENT:  Head: Normocephalic and atraumatic.  Eyes: Pupils are equal, round, and reactive to light. EOM are normal.  Neck: Neck supple.  Cardiovascular: Normal rate and regular rhythm.  Pulmonary/Chest: Effort normal. No respiratory distress.  Decreased breath sounds bilaterally  Abdominal: Soft. Bowel sounds are normal. She exhibits distension.  Musculoskeletal: Normal range of motion. She exhibits no edema.  Neurological: She is alert and oriented to person, place, and  time.  Skin: Skin is warm and dry.  Psychiatric: Affect normal.       CMP Latest Ref Rng & Units 08/24/2017  Glucose 65 - 99 mg/dL 76  BUN 6 - 20 mg/dL 13  Creatinine 0.44 - 1.00 mg/dL 0.83  Sodium 135 - 145 mmol/L 141  Potassium 3.5 - 5.1 mmol/L 2.9(L)  Chloride 101 - 111 mmol/L 114(H)  CO2 22 - 32 mmol/L 21(L)  Calcium 8.9 - 10.3 mg/dL 7.8(L)  Total Protein 6.5 - 8.1 g/dL -  Total Bilirubin 0.3 - 1.2 mg/dL -  Alkaline Phos 38 - 126 U/L -  AST 15 - 41 U/L -  ALT 14 - 54 U/L -   CBC Latest Ref Rng & Units 08/24/2017  WBC 3.6 - 11.0 K/uL 10.8  Hemoglobin 12.0 - 16.0 g/dL 12.2  Hematocrit 35.0 - 47.0 % 36.7  Platelets 150 - 440 K/uL 342   RADIOGRAPHIC STUDIES: I have personally reviewed the radiological images as listed and agreed with the findings in the report.  Ct Abdomen Pelvis W Contrast  Result Date: 08/22/2017 CLINICAL DATA:  Follow-up CT scan concerning for bowel obstruction. LEFT lower quadrant pain abdominal pain for 2 weeks. Last bowel movement 1 week ago. EXAM: CT ABDOMEN AND PELVIS WITH CONTRAST TECHNIQUE: Multidetector CT imaging of the abdomen and pelvis was performed using the standard protocol following bolus administration of intravenous contrast. CONTRAST:  36mL ISOVUE-300 IOPAMIDOL (ISOVUE-300) INJECTION 61% COMPARISON:  CT 11/28/2013 FINDINGS: Lower chest: Lung bases are  clear. Hepatobiliary: Small nodular densities along the surface of the RIGHT hepatic lobe (image 28/2 image 33/2). Nodules measure from 3 to 5 mm are partially calcified. Sludge or stones in the gallbladder. Small amount pericholecystic fluid adjacent to fundus of the bladder. No biliary duct dilatation. The low several low-density cysts in the liver. Pancreas: Pancreas is normal. No ductal dilatation. No pancreatic inflammation. Spleen: Normal spleen Adrenals/urinary tract: Adrenal glands are normal. Several intermediate density or enhancing lesions the renal cortex. For example 14 mm lesion along the lateral margin of theRIGHT lower pole (image 44/2). Cortical lesion measuring 18 mm on the anterior upper pole on the LEFT (image 24/2). There is mild hydroureter on the RIGHT hydroureter extends to the level of the pelvis. There is peritoneal nodularity in the pelvis mesentery measuring 18 mm appears to be fanning out in the colonic mesentery (image 75/2.) This may obstruct the ureter. Bladder is normal Stomach/Bowel: Stomach, duodenum and small bowel are normal. The ascending, transverse and descending colon are fluid-filled and without haustral markings. This fluid-filled colon extends the level sigmoid colon with there is caliber change. This is in the region of the mesenteric nodularity described renal section. The sigmoid colon is narrowed with potential calcified peritoneal densities (image 73/2). Rectum normal Vascular/Lymphatic: Abdominal aorta is normal caliber. No periportal or retroperitoneal adenopathy. No pelvic adenopathy. Reproductive: Uterus normal.  Ovaries not identified Other: There is a ventral hernia repair. There is enhancing nodularity along the ventral peritoneal surface with a frondlike appearance (image 40/2 for example). Small frondlike cluster measures 21 mm (image 40/2). This is similar to the mesenteric nodularity in the pelvis as well as the nodularity along the RIGHT hepatic lobe.  Small calcified nodules also extends along the LEFT pericolic gutter. Musculoskeletal: No aggressive osseous lesion. IMPRESSION: 1. The ascending, transverse and descending colon is ahaustral, fluid-filled, and mildly distended to the level of sigmoid colon. A caliber change at sigmoid colon with serosal nodularity concerning for obstruction from peritoneal carcinomatosis.  2. Nodular lesions which are partially calcified along the hepatic margin, pelvic sigmoid mesocolon pelvis as well as the ventral peritoneal space. 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 Partially calcified nodularity suggest potential ovarian source. The ventral midline peritoneal nodularity may most assessable for biopsy. 3. Mild entrapment of the distal RIGHT ureter associated with the mesenteric nodularity. 4. Indeterminate renal lesions. Recommend follow-up MRI renal protocol at some point. These results will be called to the ordering clinician or representative by the Radiologist Assistant, and communication documented in the PACS or zVision Dashboard. Electronically Signed   By: Suzy Bouchard M.D.   On: 08/22/2017 15:09   Dg Abd 2 Views  Result Date: 08/12/2017 CLINICAL DATA:  Bowel obstruction EXAM: ABDOMEN - 2 VIEW COMPARISON:  None. FINDINGS: Scattered gas containing small and large bowel loops without obstructive bowel gas pattern. Surgical tacks project over the mid abdomen presumably from ventral hernia repair. No free air, organomegaly nor suspicious radiopaque calculi. Lower lumbar facet arthropathy. IMPRESSION: Nonobstructed, nondistended bowel gas pattern. Electronically Signed   By: Ashley Royalty M.D.   On: 08/05/2017 19:55   Dg Abd Acute W/chest  Result Date: 08/24/2017 CLINICAL DATA:  Lower abdominal pain. EXAM: DG ABDOMEN ACUTE W/ 1V CHEST COMPARISON:  Chest and abdominal x-rays from yesterday. FINDINGS: Stable cardiomediastinal silhouette. Loop recorder again noted. Mild pulmonary vascular congestion,  slightly more conspicuous on today's study. Unchanged small left pleural effusion and left basilar atelectasis. No pneumothorax. Stable mild dilatation of the colon. No pneumoperitoneum. No acute osseous abnormality. IMPRESSION: 1. Stable mild colonic dilatation. 2. Unchanged small left pleural effusion and left basilar atelectasis. Electronically Signed   By: Titus Dubin M.D.   On: 08/24/2017 08:44   Dg Abd Acute W/chest  Result Date: 08/14/2017 CLINICAL DATA:  Left lower quadrant pain EXAM: DG ABDOMEN ACUTE W/ 1V CHEST COMPARISON:  08/22/2017 FINDINGS: Cardiac shadow is within normal limits. Loop recorder is noted. Left basilar atelectatic changes and small effusion are noted increased from the previous day. Right lung remains clear. No free air is noted. Mild dilatation of the colon is noted similar to that seen on the prior CT examination. Contrast is noted within the bladder. No bony abnormality is seen. IMPRESSION: Stable dilatation of the colon when compared with the prior exam. Increasing left basilar atelectasis and effusion. Electronically Signed   By: Inez Catalina M.D.   On: 08/27/2017 07:21    Assessment and plan-  Patient is a 77 y.o. female presented with abdominal pain found to have questionable bowel obstruction/abdominal carcinomatosis.  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  Plan CT-guided biopsy tissue diagnosis.  Discussed with IR will be done on Monday. 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  No intrinsic mass on flex sigmoidoscopy. Tumor markers showed CA125 elevated at 93.8, normal CEA.  Nonconclusive.  Need tissue  diagnosis Discussed with surgery Dr. Perrin Maltese.  The diagnosis is uncertain at this point, need  to obtain tissue biopsy.  Even if she has carcinomatosis of GYN origin, upfront debulking is less ideal.  Usually neoadjuvant chemotherapy is recommended prior to debulking..  Defer surgery for intervention now if patient has risk of  perforation.  Thank you for allowing me to participate in the care of this patient.   Earlie Server, MD, PhD Hematology Oncology [facility] at [facility] Pager- [phone] 08/24/2017

## 2017-08-24 NOTE — Progress Notes (Signed)
Having nausea, doesn't want med for it, not taking much of her clear liq trays.

## 2017-08-24 NOTE — Progress Notes (Signed)
Pharmacy consulted for electrolyte replacement protocol:   Goal of therapy: Electrolytes within normal limits:  K 3.5 - 5.1 Corrected Ca 8.9 - 10.3 Phos 2.5 - 4.6 Mg 1.7 - 2.4   Assessment: Lab Results  Component Value Date   CREATININE 0.83 08/24/2017   BUN 13 08/24/2017   NA 141 08/24/2017   K 2.9 (L) 08/24/2017   CL 114 (H) 08/24/2017   CO2 21 (L) 08/24/2017    Plan: K 2.6, ordering KCl 81meq iv q1h x 5 doses. Will reheck with AM labs. Will order Magnesium now and replace as necessary for hypomagnesia. Will order am labs per protocol.  6/19  Mag @ 20:30 = 2.1 No additional mag needed, will recheck electrolytes on 6/20 with AM labs  6/20 K 3.2, Normally would prefer po potassium replacement at this point but she remains NPO I will order KCl 37mEq IV q1h x 3 doses and follow K level in the morning 6/21  6/21, K 3.5, hypokalemia resolved following replacement therapy. However, patient is still having multiple very loose stools per day. I will order a follow-up BMP for 6/22 am  6/22, K 2.9, orders have been placed for KCl 56mEq IV x 6. Will follow up on AM labs,  Paulina Fusi, PharmD, BCPS 08/24/2017 2:55 PM

## 2017-08-24 NOTE — Anesthesia Preprocedure Evaluation (Deleted)
Anesthesia Evaluation    Airway        Dental   Pulmonary           Cardiovascular hypertension,      Neuro/Psych    GI/Hepatic   Endo/Other    Renal/GU      Musculoskeletal   Abdominal   Peds  Hematology   Anesthesia Other Findings   Reproductive/Obstetrics                                                              Anesthesia Evaluation  Patient identified by MRN, date of birth, ID band Patient awake    Reviewed: Allergy & Precautions, H&P , NPO status , Patient's Chart, lab work & pertinent test results, reviewed documented beta blocker date and time   History of Anesthesia Complications Negative for: history of anesthetic complications  Airway Mallampati: II  TM Distance: >3 FB Neck ROM: full    Dental  (+) Dental Advidsory Given, Edentulous Upper, Edentulous Lower, Upper Dentures, Lower Dentures   Pulmonary neg shortness of breath, sleep apnea and Continuous Positive Airway Pressure Ventilation , neg COPD, neg recent URI,           Cardiovascular Exercise Tolerance: Good hypertension, (-) angina(-) CAD, (-) Past MI, (-) Cardiac Stents and (-) CABG (-) dysrhythmias (-) Valvular Problems/Murmurs     Neuro/Psych negative neurological ROS  negative psych ROS   GI/Hepatic Neg liver ROS, GERD  ,  Endo/Other  neg diabetesHypothyroidism Morbid obesity  Renal/GU negative Renal ROS  negative genitourinary   Musculoskeletal   Abdominal   Peds  Hematology negative hematology ROS (+)   Anesthesia Other Findings Past Medical History: No date: High cholesterol No date: Hypertension No date: Thyroid disease   Reproductive/Obstetrics negative OB ROS                             Anesthesia Physical Anesthesia Plan  ASA: III  Anesthesia Plan: General   Post-op Pain Management:    Induction: Intravenous  PONV Risk Score and Plan:  3 and Propofol infusion  Airway Management Planned: Nasal Cannula  Additional Equipment:   Intra-op Plan:   Post-operative Plan:   Informed Consent: I have reviewed the patients History and Physical, chart, labs and discussed the procedure including the risks, benefits and alternatives for the proposed anesthesia with the patient or authorized representative who has indicated his/her understanding and acceptance.   Dental Advisory Given  Plan Discussed with: Anesthesiologist, CRNA and Surgeon  Anesthesia Plan Comments:         Anesthesia Quick Evaluation  Anesthesia Physical Anesthesia Plan  ASA:   Anesthesia Plan:    Post-op Pain Management:    Induction:   PONV Risk Score and Plan:   Airway Management Planned:   Additional Equipment:   Intra-op Plan:   Post-operative Plan:   Informed Consent:   Plan Discussed with:   Anesthesia Plan Comments: (We would like serum potassium to be normalized (3.0-5.5)  for a non emergent procedure )        Anesthesia Quick Evaluation

## 2017-08-25 ENCOUNTER — Inpatient Hospital Stay: Payer: Medicare HMO | Admitting: Anesthesiology

## 2017-08-25 ENCOUNTER — Inpatient Hospital Stay: Payer: Medicare HMO

## 2017-08-25 ENCOUNTER — Encounter: Admission: AD | Disposition: E | Payer: Self-pay | Source: Home / Self Care | Attending: Family Medicine

## 2017-08-25 HISTORY — PX: LAPAROTOMY: SHX154

## 2017-08-25 LAB — BASIC METABOLIC PANEL
Anion gap: 5 (ref 5–15)
BUN: 10 mg/dL (ref 6–20)
CHLORIDE: 114 mmol/L — AB (ref 101–111)
CO2: 21 mmol/L — ABNORMAL LOW (ref 22–32)
Calcium: 7.9 mg/dL — ABNORMAL LOW (ref 8.9–10.3)
Creatinine, Ser: 0.85 mg/dL (ref 0.44–1.00)
GFR calc Af Amer: >60 mL/min (ref >60)
GFR calc non Af Amer: >60 mL/min (ref >60)
GLUCOSE: 80 mg/dL (ref 65–99)
Potassium: 3.4 mmol/L — ABNORMAL LOW (ref 3.5–5.1)
Sodium: 140 mmol/L (ref 135–145)

## 2017-08-25 LAB — CBC
HCT: 35.7 % (ref 35.0–47.0)
Hemoglobin: 12 g/dL (ref 12.0–16.0)
MCH: 28.8 pg (ref 26.0–34.0)
MCHC: 33.5 g/dL (ref 32.0–36.0)
MCV: 85.8 fL (ref 80.0–100.0)
PLATELETS: 322 10*3/uL (ref 150–440)
RBC: 4.16 MIL/uL (ref 3.80–5.20)
RDW: 14.9 % — AB (ref 11.5–14.5)
WBC: 10.6 10*3/uL (ref 3.6–11.0)

## 2017-08-25 LAB — MAGNESIUM: MAGNESIUM: 1.8 mg/dL (ref 1.7–2.4)

## 2017-08-25 SURGERY — LAPAROTOMY, EXPLORATORY
Anesthesia: General | Site: Abdomen | Wound class: Clean Contaminated

## 2017-08-25 MED ORDER — KETOROLAC TROMETHAMINE 30 MG/ML IJ SOLN
30.0000 mg | Freq: Four times a day (QID) | INTRAMUSCULAR | Status: DC
  Administered 2017-08-25 – 2017-08-26 (×3): 30 mg via INTRAVENOUS
  Filled 2017-08-25 (×3): qty 1

## 2017-08-25 MED ORDER — KETOROLAC TROMETHAMINE 30 MG/ML IJ SOLN: INTRAMUSCULAR | Status: DC | PRN

## 2017-08-25 MED ORDER — ONDANSETRON HCL 4 MG/2ML IJ SOLN
INTRAMUSCULAR | Status: AC
  Filled 2017-08-25: qty 2

## 2017-08-25 MED ORDER — PIPERACILLIN-TAZOBACTAM 3.375 G IVPB 30 MIN
3.3750 g | Freq: Once | INTRAVENOUS | Status: AC
  Administered 2017-08-25: 3.375 g via INTRAVENOUS
  Filled 2017-08-25: qty 50

## 2017-08-25 MED ORDER — SODIUM CHLORIDE 0.9 % IJ SOLN
INTRAMUSCULAR | Status: AC
  Filled 2017-08-25: qty 10

## 2017-08-25 MED ORDER — ONDANSETRON HCL 4 MG/2ML IJ SOLN: 4.0000 mg | Freq: Once | INTRAMUSCULAR | Status: DC | PRN

## 2017-08-25 MED ORDER — BUPIVACAINE LIPOSOME 1.3 % IJ SUSP
INTRAMUSCULAR | Status: AC
  Filled 2017-08-25: qty 20

## 2017-08-25 MED ORDER — ROCURONIUM BROMIDE 100 MG/10ML IV SOLN
INTRAVENOUS | Status: DC | PRN
  Administered 2017-08-25: 5 mg via INTRAVENOUS
  Administered 2017-08-25: 30 mg via INTRAVENOUS
  Administered 2017-08-25: 45 mg via INTRAVENOUS

## 2017-08-25 MED ORDER — CEFAZOLIN SODIUM-DEXTROSE 2-3 GM-%(50ML) IV SOLR
INTRAVENOUS | Status: DC | PRN
  Administered 2017-08-25: 2 g via INTRAVENOUS

## 2017-08-25 MED ORDER — SUCCINYLCHOLINE CHLORIDE 20 MG/ML IJ SOLN
INTRAMUSCULAR | Status: AC
  Filled 2017-08-25: qty 1

## 2017-08-25 MED ORDER — MIDAZOLAM HCL 2 MG/2ML IJ SOLN
INTRAMUSCULAR | Status: AC
  Filled 2017-08-25: qty 2

## 2017-08-25 MED ORDER — HYDROMORPHONE HCL 1 MG/ML IJ SOLN
INTRAMUSCULAR | Status: AC
  Administered 2017-08-25: 1 mg
  Filled 2017-08-25: qty 1

## 2017-08-25 MED ORDER — KETOROLAC TROMETHAMINE 30 MG/ML IJ SOLN
INTRAMUSCULAR | Status: AC
  Administered 2017-08-25: 30 mg
  Filled 2017-08-25: qty 1

## 2017-08-25 MED ORDER — FENTANYL CITRATE (PF) 100 MCG/2ML IJ SOLN
25.0000 ug | INTRAMUSCULAR | Status: DC | PRN
  Administered 2017-08-25: 50 ug via INTRAVENOUS

## 2017-08-25 MED ORDER — FENTANYL CITRATE (PF) 100 MCG/2ML IJ SOLN
INTRAMUSCULAR | Status: DC | PRN
  Administered 2017-08-25 (×4): 50 ug via INTRAVENOUS

## 2017-08-25 MED ORDER — SUGAMMADEX SODIUM 500 MG/5ML IV SOLN
INTRAVENOUS | Status: DC | PRN
  Administered 2017-08-25: 250 mg via INTRAVENOUS

## 2017-08-25 MED ORDER — FENTANYL CITRATE (PF) 100 MCG/2ML IJ SOLN
INTRAMUSCULAR | Status: AC
  Filled 2017-08-25: qty 2

## 2017-08-25 MED ORDER — SODIUM CHLORIDE FLUSH 0.9 % IV SOLN
INTRAVENOUS | Status: AC
  Filled 2017-08-25: qty 50

## 2017-08-25 MED ORDER — DEXAMETHASONE SODIUM PHOSPHATE 10 MG/ML IJ SOLN
INTRAMUSCULAR | Status: AC
  Filled 2017-08-25: qty 1

## 2017-08-25 MED ORDER — PIPERACILLIN-TAZOBACTAM 3.375 G IVPB: 3.3750 g | Freq: Three times a day (TID) | INTRAVENOUS | Status: DC

## 2017-08-25 MED ORDER — HYDROMORPHONE HCL 1 MG/ML IJ SOLN
1.0000 mg | INTRAMUSCULAR | Status: DC | PRN
  Administered 2017-08-25 – 2017-08-30 (×12): 1 mg via INTRAVENOUS
  Filled 2017-08-25 (×12): qty 1

## 2017-08-25 MED ORDER — LACTATED RINGERS IV SOLN
INTRAVENOUS | Status: DC | PRN
  Administered 2017-08-25: 15:00:00 via INTRAVENOUS

## 2017-08-25 MED ORDER — PROPOFOL 10 MG/ML IV BOLUS
INTRAVENOUS | Status: AC
  Filled 2017-08-25: qty 20

## 2017-08-25 MED ORDER — PROPOFOL 10 MG/ML IV BOLUS
INTRAVENOUS | Status: DC | PRN
  Administered 2017-08-25: 120 mg via INTRAVENOUS

## 2017-08-25 MED ORDER — DEXAMETHASONE SODIUM PHOSPHATE 10 MG/ML IJ SOLN
INTRAMUSCULAR | Status: DC | PRN
  Administered 2017-08-25: 10 mg via INTRAVENOUS

## 2017-08-25 MED ORDER — SUCCINYLCHOLINE CHLORIDE 20 MG/ML IJ SOLN
INTRAMUSCULAR | Status: DC | PRN
  Administered 2017-08-25: 100 mg via INTRAVENOUS

## 2017-08-25 MED ORDER — PIPERACILLIN-TAZOBACTAM 3.375 G IVPB
3.3750 g | Freq: Three times a day (TID) | INTRAVENOUS | Status: DC
  Administered 2017-08-25: 3.375 g via INTRAVENOUS
  Filled 2017-08-25: qty 50

## 2017-08-25 MED ORDER — LIDOCAINE HCL (PF) 2 % IJ SOLN
INTRAMUSCULAR | Status: AC
  Filled 2017-08-25: qty 10

## 2017-08-25 MED ORDER — ROCURONIUM BROMIDE 50 MG/5ML IV SOLN
INTRAVENOUS | Status: AC
  Filled 2017-08-25: qty 1

## 2017-08-25 MED ORDER — EPHEDRINE SULFATE 50 MG/ML IJ SOLN
INTRAMUSCULAR | Status: DC | PRN
  Administered 2017-08-25 (×2): 5 mg via INTRAVENOUS
  Administered 2017-08-25: 10 mg via INTRAVENOUS
  Administered 2017-08-25: 5 mg via INTRAVENOUS

## 2017-08-25 MED ORDER — SUGAMMADEX SODIUM 500 MG/5ML IV SOLN
INTRAVENOUS | Status: AC
  Filled 2017-08-25: qty 5

## 2017-08-25 MED ORDER — MIDAZOLAM HCL 2 MG/2ML IJ SOLN
INTRAMUSCULAR | Status: DC | PRN
  Administered 2017-08-25: 2 mg via INTRAVENOUS

## 2017-08-25 MED ORDER — ONDANSETRON HCL 4 MG/2ML IJ SOLN
INTRAMUSCULAR | Status: DC | PRN
  Administered 2017-08-25: 4 mg via INTRAVENOUS

## 2017-08-25 MED ORDER — BUPIVACAINE LIPOSOME 1.3 % IJ SUSP
INTRAMUSCULAR | Status: DC | PRN
  Administered 2017-08-25: 70 mL

## 2017-08-25 MED ORDER — POTASSIUM CHLORIDE CRYS ER 20 MEQ PO TBCR: 40.0000 meq | EXTENDED_RELEASE_TABLET | Freq: Once | ORAL | Status: DC

## 2017-08-25 MED ORDER — BUPIVACAINE-EPINEPHRINE (PF) 0.25% -1:200000 IJ SOLN
INTRAMUSCULAR | Status: AC
  Filled 2017-08-25: qty 30

## 2017-08-25 MED ORDER — BUPIVACAINE-EPINEPHRINE (PF) 0.25% -1:200000 IJ SOLN
INTRAMUSCULAR | Status: DC | PRN
  Administered 2017-08-25: 30 mL

## 2017-08-25 MED ORDER — EPHEDRINE SULFATE 50 MG/ML IJ SOLN
INTRAMUSCULAR | Status: AC
  Filled 2017-08-25: qty 1

## 2017-08-25 MED ORDER — LIDOCAINE HCL (CARDIAC) PF 100 MG/5ML IV SOSY
PREFILLED_SYRINGE | INTRAVENOUS | Status: DC | PRN
  Administered 2017-08-25: 80 mg via INTRAVENOUS

## 2017-08-25 MED ORDER — CEFAZOLIN SODIUM 1 G IJ SOLR
INTRAMUSCULAR | Status: AC
  Filled 2017-08-25: qty 20

## 2017-08-25 SURGICAL SUPPLY — 50 items
APPLIER CLIP 11 MED OPEN (CLIP)
APPLIER CLIP 13 LRG OPEN (CLIP)
BLADE CLIPPER SURG (BLADE) ×3 IMPLANT
BLADE SURG 15 STRL LF DISP TIS (BLADE) ×1 IMPLANT
BLADE SURG 15 STRL SS (BLADE) ×2
CANISTER SUCT 3000ML PPV (MISCELLANEOUS) ×3 IMPLANT
CATH DRAINAGE MALECOT 26FR (CATHETERS) ×1 IMPLANT
CATH MALECOT (CATHETERS) ×3
CHLORAPREP W/TINT 26ML (MISCELLANEOUS) ×3 IMPLANT
CLIP APPLIE 11 MED OPEN (CLIP) IMPLANT
CLIP APPLIE 13 LRG OPEN (CLIP) IMPLANT
DRAPE LAPAROTOMY 100X77 ABD (DRAPES) ×3 IMPLANT
DRAPE SHEET LG 3/4 BI-LAMINATE (DRAPES) ×3 IMPLANT
DRAPE TABLE BACK 80X90 (DRAPES) ×3 IMPLANT
DRSG TEGADERM 2-3/8X2-3/4 SM (GAUZE/BANDAGES/DRESSINGS) IMPLANT
DRSG TELFA 3X8 NADH (GAUZE/BANDAGES/DRESSINGS) ×3 IMPLANT
ELECT BLADE 6.5 EXT (BLADE) ×6 IMPLANT
ELECT REM PT RETURN 9FT ADLT (ELECTROSURGICAL) ×3
ELECTRODE REM PT RTRN 9FT ADLT (ELECTROSURGICAL) ×1 IMPLANT
GAUZE SPONGE 4X4 12PLY STRL (GAUZE/BANDAGES/DRESSINGS) IMPLANT
GLOVE BIO SURGEON STRL SZ7 (GLOVE) ×24 IMPLANT
GOWN STRL REUS W/ TWL LRG LVL3 (GOWN DISPOSABLE) ×6 IMPLANT
GOWN STRL REUS W/TWL LRG LVL3 (GOWN DISPOSABLE) ×12
HANDLE SUCTION POOLE (INSTRUMENTS) ×1 IMPLANT
HANDLE YANKAUER SUCT BULB TIP (MISCELLANEOUS) ×3 IMPLANT
KIT PREVENA INCISION MGT 13 (CANNISTER) ×3 IMPLANT
LIGASURE IMPACT 36 18CM CVD LR (INSTRUMENTS) IMPLANT
NEEDLE HYPO 22GX1.5 SAFETY (NEEDLE) ×12 IMPLANT
NEEDLE HYPO 25X1 1.5 SAFETY (NEEDLE) ×3 IMPLANT
PACK BASIN MAJOR ARMC (MISCELLANEOUS) ×3 IMPLANT
RELOAD PROXIMATE 75MM BLUE (ENDOMECHANICALS) IMPLANT
SPONGE LAP 18X18 RF (DISPOSABLE) ×6 IMPLANT
SPONGE LAP 18X36 RFD (DISPOSABLE) IMPLANT
STAPLER PROXIMATE 75MM BLUE (STAPLE) IMPLANT
STAPLER SKIN PROX 35W (STAPLE) ×3 IMPLANT
SUCTION POOLE HANDLE (INSTRUMENTS) ×3
SUT PDS AB 0 CT1 27 (SUTURE) ×18 IMPLANT
SUT PDS AB 1 TP1 96 (SUTURE) ×6 IMPLANT
SUT SILK 2 0 (SUTURE) ×2
SUT SILK 2 0 SH CR/8 (SUTURE) ×3 IMPLANT
SUT SILK 2 0SH CR/8 30 (SUTURE) ×3 IMPLANT
SUT SILK 2-0 18XBRD TIE 12 (SUTURE) ×1 IMPLANT
SUT VIC AB 0 CT1 36 (SUTURE) ×6 IMPLANT
SUT VIC AB 2-0 SH 27 (SUTURE) ×4
SUT VIC AB 2-0 SH 27XBRD (SUTURE) ×2 IMPLANT
SUT VICRYL 2-0 SH 8X27 (SUTURE) ×3 IMPLANT
SYR 30ML LL (SYRINGE) ×6 IMPLANT
SYR 3ML LL SCALE MARK (SYRINGE) ×3 IMPLANT
TAPE MICROFOAM 4IN (TAPE) IMPLANT
TRAY FOLEY MTR SLVR 16FR STAT (SET/KITS/TRAYS/PACK) ×3 IMPLANT

## 2017-08-25 NOTE — Progress Notes (Signed)
Patient resting comfortably and quietly, snoring, Easily aroused.

## 2017-08-25 NOTE — Transfer of Care (Signed)
Immediate Anesthesia Transfer of Care Note  Patient: Alicia Conley  Procedure(s) Performed: EXPLORATORY LAPAROTOMY (N/A Abdomen)  Patient Location: PACU  Anesthesia Type:General  Level of Consciousness: awake  Airway & Oxygen Therapy: Patient connected to face mask oxygen  Post-op Assessment: Post -op Vital signs reviewed and stable  Post vital signs: stable  Last Vitals:  Vitals Value Taken Time  BP 158/65 08/06/2017  4:12 PM  Temp    Pulse 87 08/18/2017  4:12 PM  Resp 21 08/26/2017  4:12 PM  SpO2 98 % 08/08/2017  4:12 PM  Vitals shown include unvalidated device data.  Last Pain:  Vitals:   08/05/2017 0700  TempSrc:   PainSc: 3       Patients Stated Pain Goal: 0 (94/58/59 2924)  Complications: No apparent anesthesia complications

## 2017-08-25 NOTE — Progress Notes (Signed)
15 minute call to floor. 

## 2017-08-25 NOTE — Progress Notes (Signed)
Patient was in operation room earlier. Discussed with Dr. Dahlia Byes.  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  Unable to mobilize.  Patient had : Tube placed to temporarily decompress dilated colon. Awaiting GI input for stent placement. Per Dr.Pabon, he took a biopsy of the peritoneal implant. We will cancel patient CT-guided biopsy for now. Awaiting pathology for further oncology physicians.

## 2017-08-25 NOTE — Anesthesia Procedure Notes (Signed)
Procedure Name: Intubation Date/Time: 08/15/2017 1:09 PM Performed by: Hedda Slade, CRNA Pre-anesthesia Checklist: Patient identified, Patient being monitored, Timeout performed, Emergency Drugs available and Suction available Patient Re-evaluated:Patient Re-evaluated prior to induction Oxygen Delivery Method: Circle system utilized Preoxygenation: Pre-oxygenation with 100% oxygen Induction Type: IV induction Ventilation: Mask ventilation without difficulty Laryngoscope Size: Mac and 3 Grade View: Grade I Tube type: Oral Tube size: 7.0 mm Number of attempts: 1 Airway Equipment and Method: Stylet Placement Confirmation: ETT inserted through vocal cords under direct vision,  positive ETCO2 and breath sounds checked- equal and bilateral Secured at: 21 cm Tube secured with: Tape Dental Injury: Teeth and Oropharynx as per pre-operative assessment

## 2017-08-25 NOTE — Progress Notes (Signed)
Pharmacy Antibiotic Note  Alicia Conley is a 77 y.o. female admitted on 08/30/2017 with intra abdominal infx .  Pharmacy has been consulted for zosyn dosing.  Plan: Zosyn 3.375g IV q8h (4 hour infusion).  Height: 5\' 5"  (165.1 cm) Weight: 279 lb 12.2 oz (126.9 kg) IBW/kg (Calculated) : 57  Temp (24hrs), Avg:98.1 F (36.7 C), Min:97.7 F (36.5 C), Max:98.8 F (37.1 C)  Recent Labs  Lab 08/15/2017 1847 08/22/17 0527 08/19/2017 0422 08/24/17 0423 09/01/2017 0429  WBC 12.6* 13.5* 11.5* 10.8 10.6  CREATININE 1.18* 1.17* 0.88 0.83 0.85    Estimated Creatinine Clearance: 75.6 mL/min (by C-G formula based on SCr of 0.85 mg/dL).    No Known Allergies  Antimicrobials this admission: Anti-infectives (From admission, onward)   Start     Dose/Rate Route Frequency Ordered Stop   08/14/2017 1800  piperacillin-tazobactam (ZOSYN) IVPB 3.375 g     3.375 g 12.5 mL/hr over 240 Minutes Intravenous Every 8 hours 08/29/2017 1637     08/03/2017 1445  piperacillin-tazobactam (ZOSYN) IVPB 3.375 g     3.375 g 100 mL/hr over 30 Minutes Intravenous  Once 08/29/2017 1437 08/24/2017 1516   08/24/17 1800  [MAR Hold]  cefOXitin (MEFOXIN) 2 g in sodium chloride 0.9 % 100 mL IVPB     (MAR Hold since Sun 08/18/2017 at 1342. Reason: Transfer to a Procedural area.)   2 g 200 mL/hr over 30 Minutes Intravenous Every 6 hours 08/24/17 1606         Microbiology results: Recent Results (from the past 240 hour(s))  C difficile quick scan w PCR reflex     Status: None   Collection Time: 08/22/17  4:43 PM  Result Value Ref Range Status   C Diff antigen NEGATIVE NEGATIVE Final   C Diff toxin NEGATIVE NEGATIVE Final   C Diff interpretation No C. difficile detected.  Final    Comment: Performed at [facility], [address] [place], [place] [address]  Gastrointestinal Panel by PCR , Stool     Status: None   Collection Time: 08/22/17  4:43 PM  Result Value Ref Range Status   Campylobacter species NOT DETECTED  NOT DETECTED Final   Plesimonas shigelloides NOT DETECTED NOT DETECTED Final   Salmonella species NOT DETECTED NOT DETECTED Final   Yersinia enterocolitica NOT DETECTED NOT DETECTED Final   Vibrio species NOT DETECTED NOT DETECTED Final   Vibrio cholerae NOT DETECTED NOT DETECTED Final   Enteroaggregative E coli (EAEC) NOT DETECTED NOT DETECTED Final   Enteropathogenic E coli (EPEC) NOT DETECTED NOT DETECTED Final   Enterotoxigenic E coli (ETEC) NOT DETECTED NOT DETECTED Final   Shiga like toxin producing E coli (STEC) NOT DETECTED NOT DETECTED Final   Shigella/Enteroinvasive E coli (EIEC) NOT DETECTED NOT DETECTED Final   Cryptosporidium NOT DETECTED NOT DETECTED Final   Cyclospora cayetanensis NOT DETECTED NOT DETECTED Final   Entamoeba histolytica NOT DETECTED NOT DETECTED Final   Giardia lamblia NOT DETECTED NOT DETECTED Final   Adenovirus F40/41 NOT DETECTED NOT DETECTED Final   Astrovirus NOT DETECTED NOT DETECTED Final   Norovirus GI/GII NOT DETECTED NOT DETECTED Final   Rotavirus A NOT DETECTED NOT DETECTED Final   Sapovirus (I, II, IV, and V) NOT DETECTED NOT DETECTED Final    Comment: Performed at [facility], [address] [place], [place] [address]     Thank you for allowing pharmacy to be a part of this patient's care.  Donna Christen Chisom Muntean 08/29/2017 4:38 PM

## 2017-08-25 NOTE — Anesthesia Preprocedure Evaluation (Signed)
Anesthesia Evaluation  Patient identified by MRN, date of birth, ID band Patient awake    Reviewed: Allergy & Precautions, H&P , NPO status , Patient's Chart, lab work & pertinent test results, reviewed documented beta blocker date and time   History of Anesthesia Complications Negative for: history of anesthetic complications  Airway Mallampati: II  TM Distance: >3 FB Neck ROM: full    Dental  (+) Dental Advidsory Given, Edentulous Upper, Edentulous Lower, Upper Dentures, Lower Dentures   Pulmonary neg shortness of breath, sleep apnea and Continuous Positive Airway Pressure Ventilation , neg COPD, neg recent URI,           Cardiovascular Exercise Tolerance: Good hypertension, (-) angina(-) CAD, (-) Past MI, (-) Cardiac Stents and (-) CABG (-) dysrhythmias (-) Valvular Problems/Murmurs     Neuro/Psych negative neurological ROS  negative psych ROS   GI/Hepatic Neg liver ROS, GERD  ,  Endo/Other  neg diabetesHypothyroidism Morbid obesity  Renal/GU negative Renal ROS  negative genitourinary   Musculoskeletal   Abdominal   Peds  Hematology negative hematology ROS (+)   Anesthesia Other Findings Past Medical History: No date: High cholesterol No date: Hypertension No date: Thyroid disease   Reproductive/Obstetrics negative OB ROS                             Anesthesia Physical  Anesthesia Plan  ASA: III  Anesthesia Plan: General   Post-op Pain Management:    Induction: Intravenous  PONV Risk Score and Plan: 3 and Ondansetron and Dexamethasone  Airway Management Planned: Oral ETT  Additional Equipment:   Intra-op Plan:   Post-operative Plan: Extubation in OR  Informed Consent: I have reviewed the patients History and Physical, chart, labs and discussed the procedure including the risks, benefits and alternatives for the proposed anesthesia with the patient or authorized  representative who has indicated his/her understanding and acceptance.   Dental Advisory Given  Plan Discussed with: Anesthesiologist, CRNA and Surgeon  Anesthesia Plan Comments:         Anesthesia Quick Evaluation

## 2017-08-25 NOTE — Progress Notes (Signed)
Pending complete large bowel obstruction in need for diverting colostomy today.  Discussed with the patient detail about my thought process.  Risk benefit and possible complications including but not limited to: Bleeding, injury to adjacent structures, infection, hernias and re-interventions. Given her current CT she does have a midline large ventral hernia as well as some lesions consistent with carcinomatosis and anticipate that this will not be an easy procedure given the hostility of her abdomen.  All these findings were discussed with the patient in detail. She understands and Wishes to proceed

## 2017-08-25 NOTE — Anesthesia Postprocedure Evaluation (Signed)
Anesthesia Post Note  Patient: Charlott Holler  Procedure(s) Performed: EXPLORATORY LAPAROTOMY (N/A Abdomen)  Patient location during evaluation: PACU Anesthesia Type: General Level of consciousness: awake and alert Pain management: pain level controlled Vital Signs Assessment: post-procedure vital signs reviewed and stable Respiratory status: spontaneous breathing, nonlabored ventilation, respiratory function stable and patient connected to nasal cannula oxygen Cardiovascular status: blood pressure returned to baseline and stable Postop Assessment: no apparent nausea or vomiting Anesthetic complications: no     Last Vitals:  Vitals:   08/10/2017 1723 08/27/2017 1809  BP: (!) 169/66 (!) 150/66  Pulse: 83 84  Resp: 14 12  Temp: 36.4 C 36.7 C  SpO2: 96% 95%    Last Pain:  Vitals:   08/19/2017 1809  TempSrc: Oral  PainSc:                  Martha Clan

## 2017-08-25 NOTE — Progress Notes (Signed)
Pharmacy consulted for electrolyte replacement protocol:   Goal of therapy: Electrolytes within normal limits:  K 3.5 - 5.1 Corrected Ca 8.9 - 10.3 Phos 2.5 - 4.6 Mg 1.7 - 2.4   Assessment: Lab Results  Component Value Date   CREATININE 0.85 08/04/2017   BUN 10 08/17/2017   NA 140 08/22/2017   K 3.4 (L) 08/27/2017   CL 114 (H) 08/06/2017   CO2 21 (L) 08/05/2017    Plan: K 2.6, ordering KCl 12meq iv q1h x 5 doses. Will reheck with AM labs. Will order Magnesium now and replace as necessary for hypomagnesia. Will order am labs per protocol.  6/19  Mag @ 20:30 = 2.1 No additional mag needed, will recheck electrolytes on 6/20 with AM labs  6/20 K 3.2, Normally would prefer po potassium replacement at this point but she remains NPO I will order KCl 78mEq IV q1h x 3 doses and follow K level in the morning 6/21  6/21, K 3.5, hypokalemia resolved following replacement therapy. However, patient is still having multiple very loose stools per day. I will order a follow-up BMP for 6/22 am  6/22, K 2.9, orders have been placed for KCl 14mEq IV x 6. Will follow up on AM labs,  6/23 K 3.4, ordered KCl 52mEq PO x 1 dose. Follow up on AM labs.  Paulina Fusi, PharmD, BCPS 08/17/2017 3:47 PM

## 2017-08-25 NOTE — Progress Notes (Signed)
[facility] [place] NAME: Alicia Conley    MR#:  [id number]  DATE OF BIRTH:  1940-08-21  SUBJECTIVE:  CHIEF COMPLAINT:  No chief complaint on file. For diverting colostomy later today in discussion with general surgery to avoid pending obstruction  REVIEW OF SYSTEMS:  CONSTITUTIONAL: No fever, fatigue or weakness.  EYES: No blurred or double vision.  EARS, NOSE, AND THROAT: No tinnitus or ear pain.  RESPIRATORY: No cough, shortness of breath, wheezing or hemoptysis.  CARDIOVASCULAR: No chest pain, orthopnea, edema.  GASTROINTESTINAL: No nausea, vomiting, diarrhea or abdominal pain.  GENITOURINARY: No dysuria, hematuria.  ENDOCRINE: No polyuria, nocturia,  HEMATOLOGY: No anemia, easy bruising or bleeding SKIN: No rash or lesion. MUSCULOSKELETAL: No joint pain or arthritis.   NEUROLOGIC: No tingling, numbness, weakness.  PSYCHIATRY: No anxiety or depression.   ROS  DRUG ALLERGIES:  No Known Allergies  VITALS:  Blood pressure (!) 149/71, pulse 67, temperature 97.7 F (36.5 C), temperature source Oral, resp. rate 20, height 5\' 5"  (1.651 m), weight 126.9 kg (279 lb 12.2 oz), SpO2 96 %.  PHYSICAL EXAMINATION:  GENERAL:  77 y.o.-year-old patient lying in the bed with no acute distress.  EYES: Pupils equal, round, reactive to light and accommodation. No scleral icterus. Extraocular muscles intact.  HEENT: Head atraumatic, normocephalic. Oropharynx and nasopharynx clear.  NECK:  Supple, no jugular venous distention. No thyroid enlargement, no tenderness.  LUNGS: Normal breath sounds bilaterally, no wheezing, rales,rhonchi or crepitation. No use of accessory muscles of respiration.  CARDIOVASCULAR: S1, S2 normal. No murmurs, rubs, or gallops.  ABDOMEN: Soft, nontender, nondistended. Bowel sounds present. No organomegaly or mass.  EXTREMITIES: No pedal edema, cyanosis, or clubbing.  NEUROLOGIC: Cranial nerves II through XII are intact.  Muscle strength 5/5 in all extremities. Sensation intact. Gait not checked.  PSYCHIATRIC: The patient is alert and oriented x 3.  SKIN: No obvious rash, lesion, or ulcer.   Physical Exam LABORATORY PANEL:   CBC Recent Labs  Lab 08/06/2017 0429  WBC 10.6  HGB 12.0  HCT 35.7  PLT 322   ------------------------------------------------------------------------------------------------------------------  Chemistries  Recent Labs  Lab 08/26/2017 1847  08/19/2017 0429  NA 140   < > 140  K 2.6*   < > 3.4*  CL 105   < > 114*  CO2 24   < > 21*  GLUCOSE 114*   < > 80  BUN 27*   < > 10  CREATININE 1.18*   < > 0.85  CALCIUM 9.1   < > 7.9*  MG  --    < > 1.8  AST 48*  --   --   ALT 30  --   --   ALKPHOS 58  --   --   BILITOT 0.6  --   --    < > = values in this interval not displayed.   ------------------------------------------------------------------------------------------------------------------  Cardiac Enzymes No results for input(s): TROPONINI in the last 168 hours. ------------------------------------------------------------------------------------------------------------------  RADIOLOGY:  Dg Abd Acute W/chest  Result Date: 08/19/2017 CLINICAL DATA:  Preoperative examination. EXAM: DG ABDOMEN ACUTE W/ 1V CHEST COMPARISON:  08/24/2017; 08/10/2017; CT abdomen pelvis-08/22/2017 FINDINGS: Grossly unchanged cardiac silhouette and mediastinal contours with atherosclerotic plaque within the thoracic aorta. Loop recorder overlies the mid heart. Suspected slight reduction in persistent small left-sided effusion. Improved aeration of lung bases with persistent bibasilar opacities, left greater than right. No new focal airspace opacities. Pulmonary venous congestion without frank evidence of edema. No  pneumothorax. No acute osseus abnormalities. Read demonstrated patulous distension of multiple loops of colon with index loop of descending colon within the left mid hemiabdomen measuring  approximately 5.6 cm in diameter, similar to the 08/24/2017 examination. No pneumoperitoneum, pneumatosis or portal venous gas No definitive abnormal intra-abdominal calcifications. IMPRESSION: 1. Slight reduction in persistent small left-sided effusion and improved aeration of bilateral lung bases with persistent bibasilar opacities, atelectasis versus infiltrate. 2. Similar findings worrisome for distal colonic obstruction as demonstrated on recent abdominal CT. Electronically Signed   By: Sandi Mariscal M.D.   On: 08/31/2017 08:42   Dg Abd Acute W/chest  Result Date: 08/24/2017 CLINICAL DATA:  Lower abdominal pain. EXAM: DG ABDOMEN ACUTE W/ 1V CHEST COMPARISON:  Chest and abdominal x-rays from yesterday. FINDINGS: Stable cardiomediastinal silhouette. Loop recorder again noted. Mild pulmonary vascular congestion, slightly more conspicuous on today's study. Unchanged small left pleural effusion and left basilar atelectasis. No pneumothorax. Stable mild dilatation of the colon. No pneumoperitoneum. No acute osseous abnormality. IMPRESSION: 1. Stable mild colonic dilatation. 2. Unchanged small left pleural effusion and left basilar atelectasis. Electronically Signed   By: Titus Dubin M.D.   On: 08/24/2017 08:44    ASSESSMENT AND PLAN:  77 year old female patient with history of hypertension, hyperlipidemia, hypothyroidism referred from GI clinic for direct admission for abdominal discomfort.  *Acute carcinomatosis Etiology unknown Status post flexible sigmoidoscopy noted for internal hemorrhoids/diverticulosis August 23, 2017 Discussed with general surgery-for diverting colostomy later today to avoid possible future obstruction/may obtain biopsy results if possible at that time, if not- for CT-guided biopsy on Monday per Dr. Tasia Catchings by interventional radiology   *Acute Obstipation For diverting colostomy later today by general surgery/Dr. Dahlia Byes. Status post flexible sigmoidoscopy noted for internal  hemorrhoids/diverticulosis August 23, 2017   *Acute Hypokalemia Improved Replete with IVFs, check Mg leve   *Chronic benign essential hypertension Stable  Continue current regiment  * Chronic Hyperlipidemia Stable Resume statin upon discharge   *Acute dehydration Resolved with IV fluids for rehydration   Condition stable Prognosis very poor    All the records are reviewed and case discussed with Care Management/Social Workerr. Management plans discussed with the patient, family and they are in agreement.  CODE STATUS: full  TOTAL TIME TAKING CARE OF THIS PATIENT: 35 minutes.     POSSIBLE D/C IN 5 DAYS, DEPENDING ON CLINICAL CONDITION.   Avel Peace Elmer Merwin M.D on 08/26/2017   Between 7am to 6pm - Pager - [phone]  After 6pm go to [email or web address] - password [email or web address] [facility]  Office  [phone]  CC: Primary care physician; [name], MD  Note: This dictation was prepared with Dragon dictation along with smaller phrase technology. Any transcriptional errors that result from this process are unintentional.

## 2017-08-25 NOTE — Op Note (Signed)
PROCEDURES: 1. Laparotomy w peritoneal biopsies 2. Cholostomy tube using 26 Fr Malecot 3. 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 Ferrel Logan (REQUIRED due to the complexity of the surgery and for adequate exposure)  Anesthesia: General endotracheal anesthesia and General endotracheal - Double lumen tube  ASA Class: 3   Surgeon: Caroleen Hamman , MD FACS  Anesthesia: Gen. 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   Entire colon stuck to the peritoneal implants. colon unable to be mobilized due to carcinomatosis. Distended colon w/o ischemia Difficult case due to carcinomatosis and pt body habitus ( Massive Panus)  Estimated Blood Loss: 100cc         Drains: 19 blake          Specimens: peritoneal implants         Complications:none         Procedure Details  The patient was seen again in the Holding Room. The benefits, complications, treatment options, and expected outcomes were discussed with the patient. The risks of bleeding, infection, recurrence of symptoms, failure to resolve symptoms,  bowel injury, any of which could require further surgery were reviewed with the patient.   The patient was taken to Operating Room, identified as Alicia Conley and the procedure verified.  A Time Out was held and the above information confirmed.  Prior to the induction of general anesthesia, antibiotic prophylaxis was administered. VTE prophylaxis was in place. General endotracheal anesthesia was then administered and tolerated well. After the induction, the abdomen was prepped with Chloraprep and draped in the sterile fashion. The patient was positioned in the supine position.   After carefully reviewing the CT scan preoperatively had a very conscious decision on the approach with  laparotomy.  A transverse incision was created within the left lower quadrant where a small window away from her carcinomatosis was observed on the CT scan.  Electrocautery was used to dissect through subcutaneous tissue and the fascia was identified and elevated.  We entered the abdominal cavity under direct visualization and there was significant dilation of the descending colon and sigmoid colon.  We found multiple carcinomatosis and peritoneal implants.  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 The thing we could have done was to place a colostomy tube and this was performed after placing a double row of pursestring sutures in a segment of presumed descending colon. A colotomy was created and using an NG tube were able to decompress the dilated colon.  Significant stool was aspirated.  There was a small amount of spillage of stool that was aspirated. With the 2 pursestring sutures we were able to secure it to a Malecot tube and created an external incision to feed the Malecot tube externally. Given the spillage of stool I was able to place a 19 Blake drain in the abdominal cavity. The cavity was irrigated and the fascia was closed with multiple interrupted figure of 8 0-PDS sutures. Dense tissue was closed with a running 2-0 Vicryl and the skin was closed with staples loosely.  Prevena VAC was placed to prevent any wound infection or collections. Length 13 cms. No leaks observed. Drains were sutured in place with interrupted silk sutures.   Liposomal Marcaine was injected on all incision sites under  direct visualization.  Needle and laparotomy count were correct and there were no immediate complications  Caroleen Hamman, MD, FACS

## 2017-08-25 NOTE — Anesthesia Post-op Follow-up Note (Signed)
Anesthesia QCDR form completed.        

## 2017-08-26 ENCOUNTER — Inpatient Hospital Stay: Payer: Medicare HMO

## 2017-08-26 ENCOUNTER — Encounter: Payer: Self-pay | Admitting: Surgery

## 2017-08-26 DIAGNOSIS — K5900 Constipation, unspecified: Secondary | ICD-10-CM

## 2017-08-26 DIAGNOSIS — C482 Malignant neoplasm of peritoneum, unspecified: Secondary | ICD-10-CM

## 2017-08-26 LAB — BASIC METABOLIC PANEL
ANION GAP: 7 (ref 5–15)
BUN: 14 mg/dL (ref 6–20)
CO2: 20 mmol/L — ABNORMAL LOW (ref 22–32)
CREATININE: 1.16 mg/dL — AB (ref 0.44–1.00)
Calcium: 7.8 mg/dL — ABNORMAL LOW (ref 8.9–10.3)
Chloride: 111 mmol/L (ref 101–111)
GFR calc Af Amer: 52 mL/min — ABNORMAL LOW (ref >60)
GFR calc non Af Amer: 45 mL/min — ABNORMAL LOW (ref >60)
GLUCOSE: 128 mg/dL — AB (ref 65–99)
POTASSIUM: 4.6 mmol/L (ref 3.5–5.1)
SODIUM: 138 mmol/L (ref 135–145)

## 2017-08-26 LAB — CBC WITH DIFFERENTIAL/PLATELET
Basophils Absolute: 0 10*3/uL (ref 0–0.1)
Basophils Relative: 0 %
EOS ABS: 0 10*3/uL (ref 0–0.7)
EOS PCT: 0 %
HCT: 36.4 % (ref 35.0–47.0)
HEMOGLOBIN: 11.8 g/dL — AB (ref 12.0–16.0)
LYMPHS PCT: 3 %
Lymphs Abs: 0.6 10*3/uL — ABNORMAL LOW (ref 1.0–3.6)
MCH: 28.3 pg (ref 26.0–34.0)
MCHC: 32.4 g/dL (ref 32.0–36.0)
MCV: 87.5 fL (ref 80.0–100.0)
MONOS PCT: 4 %
Monocytes Absolute: 0.8 10*3/uL (ref 0.2–0.9)
Neutro Abs: 18.6 10*3/uL — ABNORMAL HIGH (ref 1.4–6.5)
Neutrophils Relative %: 93 %
PLATELETS: 336 10*3/uL (ref 150–440)
RBC: 4.16 MIL/uL (ref 3.80–5.20)
RDW: 15.5 % — ABNORMAL HIGH (ref 11.5–14.5)
WBC: 20.1 10*3/uL — ABNORMAL HIGH (ref 3.6–11.0)

## 2017-08-26 LAB — GLUCOSE, CAPILLARY: Glucose-Capillary: 68 mg/dL (ref 65–99)

## 2017-08-26 LAB — MAGNESIUM: MAGNESIUM: 2 mg/dL (ref 1.7–2.4)

## 2017-08-26 MED ORDER — DEXTROSE-NACL 5-0.45 % IV SOLN
Freq: Once | INTRAVENOUS | Status: AC
  Administered 2017-08-26: 17:00:00 via INTRAVENOUS

## 2017-08-26 MED ORDER — KCL IN DEXTROSE-NACL 20-5-0.45 MEQ/L-%-% IV SOLN
INTRAVENOUS | Status: AC
  Administered 2017-08-26 – 2017-08-27 (×2): via INTRAVENOUS
  Filled 2017-08-26 (×3): qty 1000

## 2017-08-26 NOTE — Progress Notes (Signed)
Patient and family requesting CPAP today; stated that she uses this at home. Hospitalist paged; awaiting callback. Barbaraann Faster, RN 10:43 PM 08/26/2017

## 2017-08-26 NOTE — Progress Notes (Signed)
Pharmacy consulted for electrolyte replacement protocol:   Goal of therapy: Electrolytes within normal limits:  K 3.5 - 5.1 Corrected Ca 8.9 - 10.3 Phos 2.5 - 4.6 Mg 1.7 - 2.4   Assessment: Lab Results  Component Value Date   CREATININE 1.16 (H) 08/26/2017   BUN 14 08/26/2017   NA 138 08/26/2017   K 4.6 08/26/2017   CL 111 08/26/2017   CO2 20 (L) 08/26/2017    Plan: K 2.6, ordering KCl 30meq iv q1h x 5 doses. Will reheck with AM labs. Will order Magnesium now and replace as necessary for hypomagnesia. Will order am labs per protocol.  6/19  Mag @ 20:30 = 2.1 No additional mag needed, will recheck electrolytes on 6/20 with AM labs  6/20 K 3.2, Normally would prefer po potassium replacement at this point but she remains NPO I will order KCl 74mEq IV q1h x 3 doses and follow K level in the morning 6/21  6/21, K 3.5, hypokalemia resolved following replacement therapy. However, patient is still having multiple very loose stools per day. I will order a follow-up BMP for 6/22 am  6/22, K 2.9, orders have been placed for KCl 12mEq IV x 6. Will follow up on AM labs,  6/23 K 3.4, ordered KCl 67mEq PO x 1 dose. Follow up on AM labs.  6/24 K 4.6, hypokalemia resolved following replacement therapy. Will follow-up on am labs.  Vallery Sa, PhamD 08/26/2017 7:41 AM

## 2017-08-26 NOTE — Progress Notes (Signed)
[facility] [place] NAME: Alicia Conley    MR#:  [id number]  DATE OF BIRTH:  07/13/1940  SUBJECTIVE:  CHIEF COMPLAINT:  No chief complaint on file. Patient without complaint, family at the bedside, await further oncology/gastroenterology recommendations, case discussed with general surgery  REVIEW OF SYSTEMS:  CONSTITUTIONAL: No fever, fatigue or weakness.  EYES: No blurred or double vision.  EARS, NOSE, AND THROAT: No tinnitus or ear pain.  RESPIRATORY: No cough, shortness of breath, wheezing or hemoptysis.  CARDIOVASCULAR: No chest pain, orthopnea, edema.  GASTROINTESTINAL: No nausea, vomiting, diarrhea or abdominal pain.  GENITOURINARY: No dysuria, hematuria.  ENDOCRINE: No polyuria, nocturia,  HEMATOLOGY: No anemia, easy bruising or bleeding SKIN: No rash or lesion. MUSCULOSKELETAL: No joint pain or arthritis.   NEUROLOGIC: No tingling, numbness, weakness.  PSYCHIATRY: No anxiety or depression.   ROS  DRUG ALLERGIES:  No Known Allergies  VITALS:  Blood pressure (!) 153/55, pulse 74, temperature 98.2 F (36.8 C), temperature source Oral, resp. rate 18, height 5\' 5"  (1.651 m), weight 126.9 kg (279 lb 12.2 oz), SpO2 98 %.  PHYSICAL EXAMINATION:  GENERAL:  77 y.o.-year-old patient lying in the bed with no acute distress.  EYES: Pupils equal, round, reactive to light and accommodation. No scleral icterus. Extraocular muscles intact.  HEENT: Head atraumatic, normocephalic. Oropharynx and nasopharynx clear.  NECK:  Supple, no jugular venous distention. No thyroid enlargement, no tenderness.  LUNGS: Normal breath sounds bilaterally, no wheezing, rales,rhonchi or crepitation. No use of accessory muscles of respiration.  CARDIOVASCULAR: S1, S2 normal. No murmurs, rubs, or gallops.  ABDOMEN: Soft, nontender, nondistended. Bowel sounds present. No organomegaly or mass.  EXTREMITIES: No pedal edema, cyanosis, or clubbing.  NEUROLOGIC:  Cranial nerves II through XII are intact. Muscle strength 5/5 in all extremities. Sensation intact. Gait not checked.  PSYCHIATRIC: The patient is alert and oriented x 3.  SKIN: No obvious rash, lesion, or ulcer.   Physical Exam LABORATORY PANEL:   CBC Recent Labs  Lab 08/26/17 0457  WBC 20.1*  HGB 11.8*  HCT 36.4  PLT 336   ------------------------------------------------------------------------------------------------------------------  Chemistries  Recent Labs  Lab 08/07/2017 1847  08/26/17 0457  NA 140   < > 138  K 2.6*   < > 4.6  CL 105   < > 111  CO2 24   < > 20*  GLUCOSE 114*   < > 128*  BUN 27*   < > 14  CREATININE 1.18*   < > 1.16*  CALCIUM 9.1   < > 7.8*  MG  --    < > 2.0  AST 48*  --   --   ALT 30  --   --   ALKPHOS 58  --   --   BILITOT 0.6  --   --    < > = values in this interval not displayed.   ------------------------------------------------------------------------------------------------------------------  Cardiac Enzymes No results for input(s): TROPONINI in the last 168 hours. ------------------------------------------------------------------------------------------------------------------  RADIOLOGY:  Dg Abd Acute W/chest  Result Date: 08/26/2017 CLINICAL DATA:  Follow-up distal colonic obstruction. EXAM: DG ABDOMEN ACUTE W/ 1V CHEST COMPARISON:  August 25, 2017 FINDINGS: There is a small left effusion. The chest is otherwise stable. Lucency over the upper abdomen on the upright view may represent free postoperative air or air within the skin folder incision. Free air would be expected after surgery yesterday. The colonic loops are significantly improved with placement of a colonic tube. No  other change. IMPRESSION: 1. A colonic tube is been placed with decompression of the colon. 2. Probable postoperative free air, as expected. 3. No other change. Electronically Signed   By: Dorise Bullion III M.D   On: 08/26/2017 08:33   Dg Abd Acute  W/chest  Result Date: 08/08/2017 CLINICAL DATA:  Preoperative examination. EXAM: DG ABDOMEN ACUTE W/ 1V CHEST COMPARISON:  08/24/2017; 08/27/2017; CT abdomen pelvis-08/22/2017 FINDINGS: Grossly unchanged cardiac silhouette and mediastinal contours with atherosclerotic plaque within the thoracic aorta. Loop recorder overlies the mid heart. Suspected slight reduction in persistent small left-sided effusion. Improved aeration of lung bases with persistent bibasilar opacities, left greater than right. No new focal airspace opacities. Pulmonary venous congestion without frank evidence of edema. No pneumothorax. No acute osseus abnormalities. Read demonstrated patulous distension of multiple loops of colon with index loop of descending colon within the left mid hemiabdomen measuring approximately 5.6 cm in diameter, similar to the 08/24/2017 examination. No pneumoperitoneum, pneumatosis or portal venous gas No definitive abnormal intra-abdominal calcifications. IMPRESSION: 1. Slight reduction in persistent small left-sided effusion and improved aeration of bilateral lung bases with persistent bibasilar opacities, atelectasis versus infiltrate. 2. Similar findings worrisome for distal colonic obstruction as demonstrated on recent abdominal CT. Electronically Signed   By: Sandi Mariscal M.D.   On: 08/15/2017 08:42    ASSESSMENT AND PLAN:  77 year old female patient with history of hypertension, hyperlipidemia, hypothyroidism referred from GI clinic for direct admission for abdominal discomfort.  *Acute carcinomatosis Etiology unknown Status post flexible sigmoidoscopy noted for internal hemorrhoids/diverticulosis August 23, 2017 Status post attempted diverting colostomy on August 25, 2017 which was aborted given severe carcinomatosis-sample of cancer taken and sent to lab for pathology review, discussed with gastroenterology-plans for possible colonic stent placement later today, await further gastroenterology/oncology  recommendations   *AcuteObstipation Stable Status post flexible sigmoidoscopy noted for internal hemorrhoids/diverticulosis August 23, 2017 as well as attempted diverting colostomy on August 25, 2017 Plan of care as stated above   *Acute Hypokalemia Repleted  *Acute hypomagnesemia Replete with IV magnesium  *Chronic benign essential hypertension Stable Continue current regiment  * ChronicHyperlipidemia Stable Resume statinupon discharge  *Acute dehydration Resolved with IV fluids for rehydration   Condition stable Prognosisverypoor   All the records are reviewed and case discussed with Care Management/Social Workerr. Management plans discussed with the patient, family and they are in agreement.  CODE STATUS: full  TOTAL TIME TAKING CARE OF THIS PATIENT: 35 minutes.     POSSIBLE D/C IN 1-3 DAYS, DEPENDING ON CLINICAL CONDITION.   Avel Peace Asyria Kolander M.D on 08/26/2017   Between 7am to 6pm - Pager - [phone]  After 6pm go to [email or web address] - password [email or web address] [facility]  Office  [phone]  CC: Primary care physician; [name], MD  Note: This dictation was prepared with Dragon dictation along with smaller phrase technology. Any transcriptional errors that result from this process are unintentional.

## 2017-08-26 NOTE — Progress Notes (Signed)
[facility] Gastroenterology Inpatient Progress Note  Subjective: Patient seen again on request from Dr. Tama High of General Surgery. Patient was taken to the OR yesterday by Dr. Dahlia Byes for laparotomy possible resection of obstructed sigmoid colon; this was not technically feasible given the multitude of peritoneal implants around the bowel and inability to mobilize the colon. A decision was made to place a decompression tube in the colon after it was decompressed surgically with colotomy.  I have been asked to consider colonic wall stent placement as a palliative treatment to the problem.   Objective: Vital signs in last 24 hours: Temp:  [97.7 F (36.5 C)-98.2 F (36.8 C)] 98.2 F (36.8 C) (06/24 1216) Pulse Rate:  [68-93] 74 (06/24 1216) Resp:  [14-20] 18 (06/24 1216) BP: (125-153)/(55-66) 153/55 (06/24 1216) SpO2:  [94 %-98 %] 98 % (06/24 1216) Blood pressure (!) 153/55, pulse 74, temperature 98.2 F (36.8 C), temperature source Oral, resp. rate 18, height 5\' 5"  (1.651 m), weight 126.9 kg (279 lb 12.2 oz), SpO2 98 %.    Intake/Output from previous day: 06/23 0701 - 06/24 0700 In: 380 [IV Piggyback:250] Out: 72 [Urine:500; Drains:300; Blood:20]  Intake/Output this shift: Total I/O In: -  Out: 70 [Drains:70]   General appearance:  Alert, good spirits, though appears ill. Resp: Coarse bs's. Cardio: RRR GI: Distended, mildly tender without rebound. BS hypoactive. Extremities: Trace edema.   Lab Results: Results for orders placed or performed during the hospital encounter of 08/08/2017 (from the past 24 hour(s))  Basic metabolic panel     Status: Abnormal   Collection Time: 08/26/17  4:57 AM  Result Value Ref Range   Sodium 138 135 - 145 mmol/L   Potassium 4.6 3.5 - 5.1 mmol/L   Chloride 111 101 - 111 mmol/L   CO2 20 (L) 22 - 32 mmol/L   Glucose, Bld 128 (H) 65 - 99 mg/dL   BUN 14 6 - 20 mg/dL   Creatinine, Ser 1.16 (H) 0.44 - 1.00 mg/dL   Calcium 7.8 (L) 8.9 -  10.3 mg/dL   GFR calc non Af Amer 45 (L) >60 mL/min   GFR calc Af Amer 52 (L) >60 mL/min   Anion gap 7 5 - 15  CBC with Differential/Platelet     Status: Abnormal   Collection Time: 08/26/17  4:57 AM  Result Value Ref Range   WBC 20.1 (H) 3.6 - 11.0 K/uL   RBC 4.16 3.80 - 5.20 MIL/uL   Hemoglobin 11.8 (L) 12.0 - 16.0 g/dL   HCT 36.4 35.0 - 47.0 %   MCV 87.5 80.0 - 100.0 fL   MCH 28.3 26.0 - 34.0 pg   MCHC 32.4 32.0 - 36.0 g/dL   RDW 15.5 (H) 11.5 - 14.5 %   Platelets 336 150 - 440 K/uL   Neutrophils Relative % 93 %   Neutro Abs 18.6 (H) 1.4 - 6.5 K/uL   Lymphocytes Relative 3 %   Lymphs Abs 0.6 (L) 1.0 - 3.6 K/uL   Monocytes Relative 4 %   Monocytes Absolute 0.8 0.2 - 0.9 K/uL   Eosinophils Relative 0 %   Eosinophils Absolute 0.0 0 - 0.7 K/uL   Basophils Relative 0 %   Basophils Absolute 0.0 0 - 0.1 K/uL  Magnesium     Status: None   Collection Time: 08/26/17  4:57 AM  Result Value Ref Range   Magnesium 2.0 1.7 - 2.4 mg/dL  Glucose, capillary     Status: None   Collection Time:  08/26/17  4:37 PM  Result Value Ref Range   Glucose-Capillary 68 65 - 99 mg/dL     Recent Labs    08/24/17 0423 08/04/2017 0429 08/26/17 0457  WBC 10.8 10.6 20.1*  HGB 12.2 12.0 11.8*  HCT 36.7 35.7 36.4  PLT 342 322 336   BMET Recent Labs    08/24/17 0423 08/05/2017 0429 08/26/17 0457  NA 141 140 138  K 2.9* 3.4* 4.6  CL 114* 114* 111  CO2 21* 21* 20*  GLUCOSE 76 80 128*  BUN 13 10 14   CREATININE 0.83 0.85 1.16*  CALCIUM 7.8* 7.9* 7.8*   LFT No results for input(s): PROT, ALBUMIN, AST, ALT, ALKPHOS, BILITOT, BILIDIR, IBILI in the last 72 hours. PT/INR No results for input(s): LABPROT, INR in the last 72 hours. Hepatitis Panel No results for input(s): HEPBSAG, HCVAB, HEPAIGM, HEPBIGM in the last 72 hours. C-Diff No results for input(s): CDIFFTOX in the last 72 hours. No results for input(s): CDIFFPCR in the last 72 hours.   Studies/Results: Dg Abd Acute W/chest  Result  Date: 08/26/2017 CLINICAL DATA:  Follow-up distal colonic obstruction. EXAM: DG ABDOMEN ACUTE W/ 1V CHEST COMPARISON:  August 25, 2017 FINDINGS: There is a small left effusion. The chest is otherwise stable. Lucency over the upper abdomen on the upright view may represent free postoperative air or air within the skin folder incision. Free air would be expected after surgery yesterday. The colonic loops are significantly improved with placement of a colonic tube. No other change. IMPRESSION: 1. A colonic tube is been placed with decompression of the colon. 2. Probable postoperative free air, as expected. 3. No other change. Electronically Signed   By: Dorise Bullion III M.D   On: 08/26/2017 08:33   Dg Abd Acute W/chest  Result Date: 08/24/2017 CLINICAL DATA:  Preoperative examination. EXAM: DG ABDOMEN ACUTE W/ 1V CHEST COMPARISON:  08/24/2017; 08/17/2017; CT abdomen pelvis-08/22/2017 FINDINGS: Grossly unchanged cardiac silhouette and mediastinal contours with atherosclerotic plaque within the thoracic aorta. Loop recorder overlies the mid heart. Suspected slight reduction in persistent small left-sided effusion. Improved aeration of lung bases with persistent bibasilar opacities, left greater than right. No new focal airspace opacities. Pulmonary venous congestion without frank evidence of edema. No pneumothorax. No acute osseus abnormalities. Read demonstrated patulous distension of multiple loops of colon with index loop of descending colon within the left mid hemiabdomen measuring approximately 5.6 cm in diameter, similar to the 08/24/2017 examination. No pneumoperitoneum, pneumatosis or portal venous gas No definitive abnormal intra-abdominal calcifications. IMPRESSION: 1. Slight reduction in persistent small left-sided effusion and improved aeration of bilateral lung bases with persistent bibasilar opacities, atelectasis versus infiltrate. 2. Similar findings worrisome for distal colonic obstruction as  demonstrated on recent abdominal CT. Electronically Signed   By: Sandi Mariscal M.D.   On: 08/13/2017 08:42    Scheduled Inpatient Medications:   . aspirin EC  81 mg Oral Daily  . atorvastatin  80 mg Oral Daily  . enoxaparin (LOVENOX) injection  40 mg Subcutaneous Q24H  . feeding supplement  1 Container Oral TID WC  . levothyroxine  75 mcg Oral QAC breakfast  . metoprolol tartrate  100 mg Oral BID  . sertraline  100 mg Oral Daily    Continuous Inpatient Infusions:   . cefOXitin 2 g (08/26/17 1756)  . dextrose 5 % and 0.45 % NaCl with KCl 20 mEq/L 75 mL/hr at 08/26/17 1756  . famotidine (PEPCID) IV Stopped (09/01/2017 2100)    PRN Inpatient  Medications:  acetaminophen **OR** acetaminophen, alum & mag hydroxide-simeth, bisacodyl, fluticasone, hydrALAZINE, HYDROmorphone (DILAUDID) injection, morphine injection, ondansetron **OR** ondansetron (ZOFRAN) IV  Miscellaneous: N/A  Assessment:  1. 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 Biopsies from laparotomy pending.   Plan:  1. Colonic wall stent placement when clinically feasible. Plan to do hopefully in the next 24-48 hrs when in receipt of stent.  The patient understands the nature of the planned procedure, indications, risks, alternatives and potential complications including but not limited to bleeding, infection, perforation, damage to internal organs and possible oversedation/side effects from anesthesia. The patient agrees and gives consent to proceed.  Please refer to procedure notes for findings, recommendations and patient disposition/instructions. Will continue to follow along. Case discussed with Dr. Rosana Hoes of General Surgery and Dr. Mike Gip of Oncology.   Emiel Kielty K. Alice Reichert, M.D. 08/26/2017, 6:27 PM

## 2017-08-26 NOTE — Progress Notes (Signed)
Nutrition Follow Up Note   DOCUMENTATION CODES:   Morbid obesity  INTERVENTION:   RD will monitor for GOC vs the need for nutrition support  Pt likely at high refeeding risk   NUTRITION DIAGNOSIS:   Inadequate oral intake related to poor appetite, nausea, vomiting as evidenced by per patient/family report. -continues as pt on NPO/clear liquid diet since admit   GOAL:   Patient will meet greater than or equal to 90% of their needs  -not met   MONITOR:   Diet advancement, Labs, Weight trends, I & O's, Skin  ASSESSMENT:   77 year old female who was directly admitted to the hospital by GI clinic for abdominal discomfort. PMH significant for hyperlipidemia, hypertension, and hypothyroidism. KUB showed no evidence of free air and no evidence of large or small bowel obstruction.   Pt s/p flex sigmoidectomy 6/21- found to have poor bowel stenosis, inadequate bowel  prep, poor endoscopic visualization and restricted mobility of the colon.   Pt s/p ex lap 6/23- found to have frozen abdomen with massive and multiple peritoneal implants and entire colon stuck to the peritoneal implants. Colon unable to be mobilized due to carcinomatosis. Colotomy was created and an NG tube was used to decompress the dilated colon. Significant stool was aspirated. Biopsies pending. CT-guided biopsy cancelled.   Pt on NPO/clear liquid since admit; pt now without adequate nutrition for > 7 days. Currently awaiting GI input for stent placement. Spoke to MD, pt with poor prognosis; unsure of plan for now. RD will monitor for GOC vs the need for nutrition support. Pt will likely be at high refeeding risk.   Medications reviewed and include: aspirin, lovenox, synthroid, cefoxitin, pepcid, morphine   Labs reviewed: K 4.6 wnl, creat 1.16(H), Ca 7.8(L), Mg 2.0 wnl Wbc- 20.1(H)  Diet Order:   Diet Order           Diet NPO time specified  Diet effective midnight         EDUCATION NEEDS:   No education  needs have been identified at this time  Skin:  Skin Assessment: Reviewed RN Assessment(incision abdomen )  Last BM:  6/23- type 7  Height:   Ht Readings from Last 1 Encounters:  08/04/2017 5' 5"  (1.651 m)    Weight:    Wt Readings from Last 1 Encounters:  08/06/2017 279 lb 12.2 oz (126.9 kg)    Ideal Body Weight:  56.8 kg  BMI:  45.3 kg/m^2  Estimated Nutritional Needs:   Kcal:  2000-2300kcal/day   Protein:  127-140g/day   Fluid:  >1.7L/day   Koleen Distance MS, RD, LDN Pager #- [phone] Office#- [phone] After Hours Pager: [phone]

## 2017-08-26 NOTE — Progress Notes (Signed)
Dr. Duane Boston notified of patient's request for CPAP as at home; acknowledged; new order written. Barbaraann Faster, RN 11:01 PM 08/26/2017

## 2017-08-26 NOTE — Progress Notes (Signed)
[facility] NOTE   Pharmacy Consult for TPN Indication: frozen abdomen with multiple peritoneal implants  Patient Measurements: Height: 5\' 5"  (165.1 cm) Weight: 279 lb 12.2 oz (126.9 kg) IBW/kg (Calculated) : 57 TPN AdjBW (KG): 74.5 Body mass index is 46.56 kg/m.   Assessment: Dr. Jerelyn Charles wants to start TPN, however patient does not yet have central access. After discussion with Dr. Posey Pronto, will continue IVF tonight and start TPN tomorrow.   GI:  Endo:  Insulin requirements in the past 24 hours:  Lytes: Renal: Pulm: Cards:  Hepatobil: Neuro: ID:  TPN Access: TPN start date: Nutritional Goals: kCal: Protein:  Fluid:  Current Nutrition: IVF  Plan:  Will order baseline labs along with daily weights and strict ins and outs. Will f/u plan for TPN tomorrow.   Ulice Dash D 08/26/2017,9:28 PM

## 2017-08-26 NOTE — Progress Notes (Signed)
[facility] Hematology/Oncology Progress Note  Date of admission: 08/20/2017  Hospital day:  08/26/2017  Chief Complaint: Alicia Conley is a 77 y.o. female who was admitted on 08/24/2017 with abdominal pain.  Subjective:  No current abdominal pain.  Social History: The patient is accompanied by her daughter, son-in-law, great granddaughter and husband today.  Allergies: No Known Allergies  Scheduled Medications: . aspirin EC  81 mg Oral Daily  . atorvastatin  80 mg Oral Daily  . enoxaparin (LOVENOX) injection  40 mg Subcutaneous Q24H  . feeding supplement  1 Container Oral TID WC  . levothyroxine  75 mcg Oral QAC breakfast  . metoprolol tartrate  100 mg Oral BID  . sertraline  100 mg Oral Daily    Review of Systems: GENERAL:  Fatigue.  No fevers, sweats.  Weight loss of 16 pounds. PERFORMANCE STATUS (ECOG):  2 HEENT:  No visual changes, runny nose, sore throat, mouth sores or tenderness. Lungs: No shortness of breath or cough.  No hemoptysis. Cardiac:  No chest pain, palpitations, orthopnea, or PND. GI:  Abdominal pain.  NPO.  No current nausea, vomiting.  No flatus or bowel movement.  No melena or hematochezia. GU:  No urgency, frequency, dysuria, or hematuria. Musculoskeletal:  No back pain.  No joint pain.  No muscle tenderness. Extremities:  No pain or swelling. Skin:  No rashes or skin changes. Neuro:  No headache, numbness or weakness, balance or coordination issues. Endocrine:  No diabetes.  Thyroid disease on Synthroid.  Mo hot flashes or night sweats. Psych:  No mood changes, depression or anxiety. Pain:  No focal pain. Review of systems:  All other systems reviewed and found to be negative.  Physical Exam: Blood pressure (!) 104/48, pulse 78, temperature 98.8 F (37.1 C), temperature source Oral, resp. rate 20, height 5' 5"  (1.651 m), weight 279 lb 12.2 oz (126.9 kg), SpO2 96 %.  GENERAL:  Well developed, well nourished, heavyset woman lying  comfortably on the medical unit in no acute distress. MENTAL STATUS:  Alert and oriented to person, place and time. HEAD:  Short gray hair.  Normocephalic, atraumatic, face symmetric, no Cushingoid features. EYES:  Pupils equal round and reactive to light and accomodation.  No conjunctivitis or scleral icterus. ENT:  [place] in place.  Oropharynx clear without lesion.  Tongue normal. Mucous membranes moist.  RESPIRATORY:  Clear to auscultation without rales, wheezes or rhonchi. CARDIOVASCULAR:  Regular rate and rhythm without murmur, rub or gallop. ABDOMEN:  Soft, slightly tender without guarding or rebound tenderness.  Sparse bowel sounds.  No appreciable hepatosplenomegaly.  No palpable masses.  Drain in place. VAC in place. SKIN:  No rashes, ulcers or lesions. EXTREMITIES: No edema, no skin discoloration or tenderness.  No palpable cords. LYMPH NODES: No palpable cervical, supraclavicular, axillary or inguinal adenopathy  NEUROLOGICAL: Unremarkable. PSYCH:  Appropriate.   Results for orders placed or performed during the hospital encounter of 08/20/2017 (from the past 48 hour(s))  Basic metabolic panel     Status: Abnormal   Collection Time: 08/16/2017  4:29 AM  Result Value Ref Range   Sodium 140 135 - 145 mmol/L   Potassium 3.4 (L) 3.5 - 5.1 mmol/L   Chloride 114 (H) 101 - 111 mmol/L   CO2 21 (L) 22 - 32 mmol/L   Glucose, Bld 80 65 - 99 mg/dL   BUN 10 6 - 20 mg/dL   Creatinine, Ser 0.85 0.44 - 1.00 mg/dL   Calcium 7.9 (L)  8.9 - 10.3 mg/dL   GFR calc non Af Amer >60 >60 mL/min   GFR calc Af Amer >60 >60 mL/min    Comment: (NOTE) The eGFR has been calculated using the CKD EPI equation. This calculation has not been validated in all clinical situations. eGFR's persistently <60 mL/min signify possible Chronic Kidney Disease.    Anion gap 5 5 - 15    Comment: Performed at [facility], [address] [place], [place] [address]  CBC     Status: Abnormal   Collection Time:  08/11/2017  4:29 AM  Result Value Ref Range   WBC 10.6 3.6 - 11.0 K/uL   RBC 4.16 3.80 - 5.20 MIL/uL   Hemoglobin 12.0 12.0 - 16.0 g/dL   HCT 35.7 35.0 - 47.0 %   MCV 85.8 80.0 - 100.0 fL   MCH 28.8 26.0 - 34.0 pg   MCHC 33.5 32.0 - 36.0 g/dL   RDW 14.9 (H) 11.5 - 14.5 %   Platelets 322 150 - 440 K/uL    Comment: Performed at [facility], [address] [place], [place] [address]  Magnesium     Status: None   Collection Time: 08/10/2017  4:29 AM  Result Value Ref Range   Magnesium 1.8 1.7 - 2.4 mg/dL    Comment: Performed at [facility], [address] [place], [place] [address]  Basic metabolic panel     Status: Abnormal   Collection Time: 08/26/17  4:57 AM  Result Value Ref Range   Sodium 138 135 - 145 mmol/L   Potassium 4.6 3.5 - 5.1 mmol/L   Chloride 111 101 - 111 mmol/L   CO2 20 (L) 22 - 32 mmol/L   Glucose, Bld 128 (H) 65 - 99 mg/dL   BUN 14 6 - 20 mg/dL   Creatinine, Ser 1.16 (H) 0.44 - 1.00 mg/dL   Calcium 7.8 (L) 8.9 - 10.3 mg/dL   GFR calc non Af Amer 45 (L) >60 mL/min   GFR calc Af Amer 52 (L) >60 mL/min    Comment: (NOTE) The eGFR has been calculated using the CKD EPI equation. This calculation has not been validated in all clinical situations. eGFR's persistently <60 mL/min signify possible Chronic Kidney Disease.    Anion gap 7 5 - 15    Comment: Performed at [facility], [address] [place], [place] [address]  CBC with Differential/Platelet     Status: Abnormal   Collection Time: 08/26/17  4:57 AM  Result Value Ref Range   WBC 20.1 (H) 3.6 - 11.0 K/uL   RBC 4.16 3.80 - 5.20 MIL/uL   Hemoglobin 11.8 (L) 12.0 - 16.0 g/dL   HCT 36.4 35.0 - 47.0 %   MCV 87.5 80.0 - 100.0 fL   MCH 28.3 26.0 - 34.0 pg   MCHC 32.4 32.0 - 36.0 g/dL   RDW 15.5 (H) 11.5 - 14.5 %   Platelets 336 150 - 440 K/uL   Neutrophils Relative % 93 %   Neutro Abs 18.6 (H) 1.4 - 6.5 K/uL   Lymphocytes Relative 3 %   Lymphs Abs 0.6 (L) 1.0 - 3.6 K/uL    Monocytes Relative 4 %   Monocytes Absolute 0.8 0.2 - 0.9 K/uL   Eosinophils Relative 0 %   Eosinophils Absolute 0.0 0 - 0.7 K/uL   Basophils Relative 0 %   Basophils Absolute 0.0 0 - 0.1 K/uL    Comment: Performed at [facility], [address] [place], [place] [address]  Magnesium     Status: None   Collection Time: 08/26/17  4:57 AM  Result Value Ref Range   Magnesium 2.0 1.7 - 2.4 mg/dL    Comment: Performed at [facility], [address] [place],  [address]  Glucose, capillary     Status: None   Collection Time: 08/26/17  4:37 PM  Result Value Ref Range   Glucose-Capillary 68 65 - 99 mg/dL   Dg Abd Acute W/chest  Result Date: 08/26/2017 CLINICAL DATA:  Follow-up distal colonic obstruction. EXAM: DG ABDOMEN ACUTE W/ 1V CHEST COMPARISON:  August 25, 2017 FINDINGS: There is a small left effusion. The chest is otherwise stable. Lucency over the upper abdomen on the upright view may represent free postoperative air or air within the skin folder incision. Free air would be expected after surgery yesterday. The colonic loops are significantly improved with placement of a colonic tube. No other change. IMPRESSION: 1. A colonic tube is been placed with decompression of the colon. 2. Probable postoperative free air, as expected. 3. No other change. Electronically Signed   By: Dorise Bullion III M.D   On: 08/26/2017 08:33   Dg Abd Acute W/chest  Result Date: 08/11/2017 CLINICAL DATA:  Preoperative examination. EXAM: DG ABDOMEN ACUTE W/ 1V CHEST COMPARISON:  08/24/2017; 08/15/2017; CT abdomen pelvis-08/22/2017 FINDINGS: Grossly unchanged cardiac silhouette and mediastinal contours with atherosclerotic plaque within the thoracic aorta. Loop recorder overlies the mid heart. Suspected slight reduction in persistent small left-sided effusion. Improved aeration of lung bases with persistent bibasilar opacities, left greater than right. No new focal airspace opacities.  Pulmonary venous congestion without frank evidence of edema. No pneumothorax. No acute osseus abnormalities. Read demonstrated patulous distension of multiple loops of colon with index loop of descending colon within the left mid hemiabdomen measuring approximately 5.6 cm in diameter, similar to the 08/24/2017 examination. No pneumoperitoneum, pneumatosis or portal venous gas No definitive abnormal intra-abdominal calcifications. IMPRESSION: 1. Slight reduction in persistent small left-sided effusion and improved aeration of bilateral lung bases with persistent bibasilar opacities, atelectasis versus infiltrate. 2. Similar findings worrisome for distal colonic obstruction as demonstrated on recent abdominal CT. Electronically Signed   By: Sandi Mariscal M.D.   On: 08/19/2017 08:42    Assessment:  Alicia Conley is a 77 y.o. female with peritoneal carcinomatosis s/p laparotomy with peritoneal biopsies on 08/08/2017.  Pathology is pending.  Abdominal and pelvic CT on 08/22/2017 revealed fluid filled ascending, transverse and descending colon without haustral markings to the level of the sigmoid colon with associated serosal nodularity concerning for obstruction from peritoneal carcinomatosis.  There are nodular lesions which are partially calcified along the hepatic margin, pelvic sigmoid mesocolon pelvis as well as the ventral peritoneal space concerning for peritoneal carcinomatosis.  There is mild entrapment of the distal RIGHT ureter associated with the mesenteric nodularity.  There are indeterminate renal lesions.  She is s/p bilateral oophorectomy years ago.  She denies any family history of ovarian or breast cancer.  CA125 was 93.8 and CEA 2.8 on 08/18/2017.  Plan: 1.  Oncology:  Patient presented with a 3 week history of constipation.  Abdomen and pelvic CT at [facility] on 08/16/2017 revealed impacted stool and thickening in the sigmoid colon.  She presented with abdominal pain to [facility].  She underwent  laparotomy with peritoneal biopsies yesterday.  She was described as having a frozen abdomen with massive and multiple peritoneal implants.  The colon was unable to be mobilized  secondary to carcinomatosis.  She was seen by Dr. Alice Reichert today for consideration of a colonic stent to relieve the obstruction.  Discussed with patient and family current medical status.  Await biopsy results.  Patient is not a candidate for debulking surgery.  Consideration will be made for possible palliative chemotherapy once results are available.  Several questions asked and answered.   Lequita Asal, MD  08/26/2017, 9:20 PM

## 2017-08-26 NOTE — Care Management Important Message (Signed)
Copy of signed IM left with patient in room.  

## 2017-08-26 NOTE — Progress Notes (Signed)
[facility] Day(s): 5.   Post op day(s): 1 Day Post-Op.   Interval History: Patient seen and examined, no acute events or new complaints overnight. Patient reports LLQ abdominal pain is controlled, denies flatus, BM, N/V, fever/chills, CP, or SOB. Patient and her family have many questions and are eager for pathology results and to learn management options. Though patient's baseline activity is limited, attributed to chronic back pain, she denies CP or SOB with exertion.  Review of Systems:  Constitutional: denies fever, chills  Respiratory: denies any shortness of breath  Cardiovascular: denies chest pain or palpitations  Gastrointestinal: abdominal pain, N/V, and bowel function as per interval history Musculoskeletal: denies pain, decreased motor or sensation Integumentary: denies any other rashes or skin discolorations except post-surgical abdominal wounds  Vital signs in last 24 hours: [min-max] current  Temp:  [97.6 F (36.4 C)-98.2 F (36.8 C)] 98.2 F (36.8 C) (06/24 0518) Pulse Rate:  [68-93] 68 (06/24 0518) Resp:  [11-22] 20 (06/24 0518) BP: (125-169)/(57-71) 125/58 (06/24 0518) SpO2:  [94 %-99 %] 98 % (06/24 0518)     Height: 5\' 5"  (165.1 cm) Weight: 279 lb 12.2 oz (126.9 kg) BMI (Calculated): 46.56   Intake/Output this shift:  No intake/output data recorded.   Intake/Output last 2 shifts:  @IOLAST2SHIFTS @   Physical Exam:  Constitutional: alert, cooperative and no distress  Respiratory: breathing non-labored at rest  Cardiovascular: regular rate and sinus rhythm  Gastrointestinal: soft and not visibly distended, though morbidly obese with mild LLQ peri-incisional tenderness to palpation, colon-drainage tube well-secured without surrounding erythema or drainage  Labs:  CBC Latest Ref Rng & Units 08/26/2017 08/17/2017 08/24/2017  WBC 3.6 - 11.0 K/uL 20.1(H) 10.6 10.8  Hemoglobin 12.0 - 16.0 g/dL 11.8(L) 12.0 12.2  Hematocrit 35.0 - 47.0 % 36.4  35.7 36.7  Platelets 150 - 440 K/uL 336 322 342   CMP Latest Ref Rng & Units 08/26/2017 08/24/2017 08/24/2017  Glucose 65 - 99 mg/dL 128(H) 80 76  BUN 6 - 20 mg/dL 14 10 13   Creatinine 0.44 - 1.00 mg/dL 1.16(H) 0.85 0.83  Sodium 135 - 145 mmol/L 138 140 141  Potassium 3.5 - 5.1 mmol/L 4.6 3.4(L) 2.9(L)  Chloride 101 - 111 mmol/L 111 114(H) 114(H)  CO2 22 - 32 mmol/L 20(L) 21(L) 21(L)  Calcium 8.9 - 10.3 mg/dL 7.8(L) 7.9(L) 7.8(L)  Total Protein 6.5 - 8.1 g/dL - - -  Total Bilirubin 0.3 - 1.2 mg/dL - - -  Alkaline Phos 38 - 126 U/L - - -  AST 15 - 41 U/L - - -  ALT 14 - 54 U/L - - -    Assessment/Plan: 77 y.o. female with distal large bowel obstruction attributed to external compression by extensive diffuse peritoneal carcinomatosis 1 Day Post-Op s/p laparotomy with unsuccessful attempted colostomy instead with decompressing colon-drainage tube, complicated by post-surgical leukocytosis and by comorbidities including morbid obesity (BMI 47), HTN, hypercholesterolemia, thyroid disease (not otherwise specified), and chronic back pain.   - pain control prn (minimize narcotics)  - NPO with IV fluid, consider PICC for TPN  - monitor ongoing abdominal exam and bowel function  - palliative decompressing distal colonic stent discussed with Dr. Alice Reichert  - attempted to answer as many of patient's and her family's questions as possible  - patient discussed with Dr. Mike Gip (covering for Dr. Tasia Catchings) and Dr. Jerelyn Charles  - follow up repeat CBC tomorrow and pending pathology  - medical management of comorbidities  - DVT prophylaxis, ambulation  All of the above findings and recommendations were discussed with the patient, patient's family, and the medical team, and all of patient's and family's questions were answered to their expressed satisfaction.   Thank you for the opportunity to participate in this patient's care.   -- Marilynne Drivers Rosana Hoes, MD, [place]: [facility] - Partnering for exceptional care. Office: [phone]

## 2017-08-27 ENCOUNTER — Inpatient Hospital Stay: Payer: Self-pay

## 2017-08-27 LAB — GLUCOSE, CAPILLARY: GLUCOSE-CAPILLARY: 93 mg/dL (ref 70–99)

## 2017-08-27 LAB — COMPREHENSIVE METABOLIC PANEL
ALK PHOS: 61 U/L (ref 38–126)
ALT: 22 U/L (ref 0–44)
AST: 43 U/L — ABNORMAL HIGH (ref 15–41)
Albumin: 2.7 g/dL — ABNORMAL LOW (ref 3.5–5.0)
Anion gap: 9 (ref 5–15)
BUN: 21 mg/dL (ref 8–23)
CALCIUM: 7.7 mg/dL — AB (ref 8.9–10.3)
CO2: 19 mmol/L — AB (ref 22–32)
CREATININE: 1.46 mg/dL — AB (ref 0.44–1.00)
Chloride: 111 mmol/L (ref 98–111)
GFR, EST AFRICAN AMERICAN: 39 mL/min — AB (ref >60)
GFR, EST NON AFRICAN AMERICAN: 34 mL/min — AB (ref >60)
Glucose, Bld: 84 mg/dL (ref 70–99)
Potassium: 4.3 mmol/L (ref 3.5–5.1)
SODIUM: 139 mmol/L (ref 135–145)
Total Bilirubin: 0.6 mg/dL (ref 0.3–1.2)
Total Protein: 5.9 g/dL — ABNORMAL LOW (ref 6.5–8.1)

## 2017-08-27 LAB — DIFFERENTIAL
Basophils Absolute: 0.1 10*3/uL (ref 0–0.1)
Basophils Relative: 1 %
EOS PCT: 1 %
Eosinophils Absolute: 0.1 10*3/uL (ref 0–0.7)
LYMPHS ABS: 0.9 10*3/uL — AB (ref 1.0–3.6)
LYMPHS PCT: 6 %
MONO ABS: 1.2 10*3/uL — AB (ref 0.2–0.9)
Monocytes Relative: 8 %
Neutro Abs: 12.5 10*3/uL — ABNORMAL HIGH (ref 1.4–6.5)
Neutrophils Relative %: 84 %

## 2017-08-27 LAB — TRIGLYCERIDES: Triglycerides: 104 mg/dL (ref <150)

## 2017-08-27 LAB — PREALBUMIN: Prealbumin: 7.7 mg/dL — ABNORMAL LOW (ref 18–38)

## 2017-08-27 LAB — MAGNESIUM: MAGNESIUM: 1.7 mg/dL (ref 1.7–2.4)

## 2017-08-27 LAB — CBC
HCT: 34.4 % — ABNORMAL LOW (ref 35.0–47.0)
Hemoglobin: 11.3 g/dL — ABNORMAL LOW (ref 12.0–16.0)
MCH: 28.8 pg (ref 26.0–34.0)
MCHC: 32.7 g/dL (ref 32.0–36.0)
MCV: 88.2 fL (ref 80.0–100.0)
PLATELETS: 329 10*3/uL (ref 150–440)
RBC: 3.9 MIL/uL (ref 3.80–5.20)
RDW: 15.5 % — ABNORMAL HIGH (ref 11.5–14.5)
WBC: 14.8 10*3/uL — ABNORMAL HIGH (ref 3.6–11.0)

## 2017-08-27 LAB — PHOSPHORUS: Phosphorus: 2.8 mg/dL (ref 2.5–4.6)

## 2017-08-27 MED ORDER — SODIUM CHLORIDE 0.9% FLUSH
10.0000 mL | Freq: Two times a day (BID) | INTRAVENOUS | Status: DC
  Administered 2017-08-27 – 2017-08-30 (×4): 10 mL
  Administered 2017-08-31: 20 mL
  Administered 2017-08-31 – 2017-09-06 (×11): 10 mL

## 2017-08-27 MED ORDER — SODIUM CHLORIDE 0.9% FLUSH
10.0000 mL | INTRAVENOUS | Status: DC | PRN
  Administered 2017-09-03: 20 mL
  Administered 2017-09-04 (×3): 10 mL
  Filled 2017-08-27 (×4): qty 40

## 2017-08-27 MED ORDER — FAT EMULSION PLANT BASED 20 % IV EMUL
250.0000 mL | INTRAVENOUS | Status: AC
  Administered 2017-08-27: 250 mL via INTRAVENOUS
  Filled 2017-08-27 (×2): qty 250

## 2017-08-27 MED ORDER — SODIUM CHLORIDE 0.45 % IV BOLUS
1000.0000 mL | Freq: Once | INTRAVENOUS | Status: AC
  Administered 2017-08-27: 1000 mL via INTRAVENOUS

## 2017-08-27 MED ORDER — TRACE MINERALS CR-CU-MN-SE-ZN 10-1000-500-60 MCG/ML IV SOLN
INTRAVENOUS | Status: DC
  Administered 2017-08-27: 19:00:00 via INTRAVENOUS
  Filled 2017-08-27: qty 1000

## 2017-08-27 MED ORDER — SODIUM CHLORIDE 0.9 % IV SOLN
INTRAVENOUS | Status: AC
  Administered 2017-08-27: 19:00:00 via INTRAVENOUS

## 2017-08-27 MED ORDER — INSULIN ASPART 100 UNIT/ML ~~LOC~~ SOLN: 0.0000 [IU] | Freq: Four times a day (QID) | SUBCUTANEOUS | Status: DC

## 2017-08-27 MED ORDER — INSULIN ASPART 100 UNIT/ML ~~LOC~~ SOLN
0.0000 [IU] | SUBCUTANEOUS | Status: DC
  Administered 2017-08-28: 2 [IU] via SUBCUTANEOUS
  Administered 2017-08-28 – 2017-08-29 (×3): 1 [IU] via SUBCUTANEOUS
  Administered 2017-08-29: 3 [IU] via SUBCUTANEOUS
  Administered 2017-08-29 – 2017-08-30 (×3): 1 [IU] via SUBCUTANEOUS
  Administered 2017-08-30: 2 [IU] via SUBCUTANEOUS
  Administered 2017-08-30 (×3): 1 [IU] via SUBCUTANEOUS
  Administered 2017-08-30: 2 [IU] via SUBCUTANEOUS
  Administered 2017-08-31 – 2017-09-02 (×9): 1 [IU] via SUBCUTANEOUS
  Administered 2017-09-03 (×2): 2 [IU] via SUBCUTANEOUS
  Administered 2017-09-03 – 2017-09-04 (×4): 1 [IU] via SUBCUTANEOUS
  Administered 2017-09-04: 2 [IU] via SUBCUTANEOUS
  Administered 2017-09-04 (×2): 1 [IU] via SUBCUTANEOUS
  Administered 2017-09-04: 2 [IU] via SUBCUTANEOUS
  Administered 2017-09-04 – 2017-09-05 (×4): 1 [IU] via SUBCUTANEOUS
  Administered 2017-09-05 – 2017-09-06 (×4): 2 [IU] via SUBCUTANEOUS
  Administered 2017-09-06 (×2): 1 [IU] via SUBCUTANEOUS
  Administered 2017-09-06: 2 [IU] via SUBCUTANEOUS
  Administered 2017-09-07: 1 [IU] via SUBCUTANEOUS
  Filled 2017-08-27 (×44): qty 1

## 2017-08-27 NOTE — Progress Notes (Signed)
Nutrition Follow Up Note   DOCUMENTATION CODES:   Morbid obesity  INTERVENTION:   Initiate Clinimix 5/15 with electrolytes at 31m/hr + 20% lipids _0 /hr x 12 hrs/day (Goal rate 862mhr once labs stable)   Regimen at goal provides 2014kcal/day, 100g/day protein, 229221molume    Add MVI daily   Add trace elements daily    Add IV thiamine 100m49mily x 3 days    Pt at high refeeding risk; recommend monitor P, K, and Mg daily    Daily weights   Change IVFs to NaCl without additives _1 /hr- ok per MD  NUTRITION DIAGNOSIS:   Inadequate oral intake related to poor appetite, nausea, vomiting as evidenced by per patient/family report. -continues as pt on NPO/clear liquid diet since admit   GOAL:   Patient will meet greater than or equal to 90% of their needs  -not met   MONITOR:   Diet advancement, Labs, Weight trends, I & O's, Skin, TPN   ASSESSMENT:   77 y51r old female who was directly admitted to the hospital by GI clinic for abdominal discomfort. PMH significant for hyperlipidemia, hypertension, and hypothyroidism. KUB showed no evidence of free air and no evidence of large or small bowel obstruction.   Pt s/p flex sigmoidectomy 6/21- found to have poor bowel stenosis, inadequate bowel  prep, poor endoscopic visualization and restricted mobility of the colon.   Pt s/p ex lap 6/23- found to have frozen abdomen with massive and multiple peritoneal implants and entire colon stuck to the peritoneal implants. Colon unable to be mobilized due to carcinomatosis. Colotomy was created and an NG tube was used to decompress the dilated colon. Significant stool was aspirated. Biopsies pending. CT-guided biopsy cancelled.   Pt on NPO/clear liquid since admit; pt now without adequate nutrition for > 7 days. Currently awaiting GI stent placement. Spoke to MD, family wishing to proceed with aggressive treatment. Will plan to start TPN today. Pt at high refeeding risk.   Medications  reviewed and include: aspirin, lovenox, synthroid, cefoxitin, pepcid, morphine   Labs reviewed: K 4.3 wnl, creat 1.46(H), Ca 7.7(L) adj. 8.74(L), P 2.8 wnl, Mg 1.7 wnl, alb 2.7(L) Triglycerides- 104 Wbc- 14.8(H)  Diet Order:   Diet Order           Diet NPO time specified  Diet effective midnight         EDUCATION NEEDS:   No education needs have been identified at this time  Skin:  Skin Assessment: Reviewed RN Assessment(incision abdomen )  Last BM:  6/23- type 7  Height:   Ht Readings from Last 1 Encounters:  08/30/2017 _2  (1.651 m)    Weight:    Wt Readings from Last 1 Encounters:  08/27/17 284 lb 13.4 oz (129.2 kg)    Ideal Body Weight:  56.8 kg  BMI:  45.3 kg/m^2  Estimated Nutritional Needs:   Kcal:  2000-2300kcal/day   Protein:  127-140g/day   Fluid:  >1.7L/day   CaseKoleen Distance RD, LDN Pager #- [phone]#- [phone] Hours Pager: [phone]

## 2017-08-27 NOTE — Progress Notes (Signed)
   08/27/17 0940  Clinical Encounter Type  Visited With Patient and family together  Visit Type Spiritual support   Chaplain visited with patient and husband, listened to their concerns about effectiveness of upcoming procedure scheduled for today or tomorrow.  Husband states their pastor has been coming to visit daily, as well as other family members.  Husband offered that his wife is terminal and he wants her to be comfortable.  Husband dealing with uncertainty of what will happen next; waiting for procedure in the next day or so and will "take it from there" depending on the outcome.  Patient requested prayer; Chaplain prayed with patient and husband and asked them to have the nurse page on-call Chaplain if they would like additional support.

## 2017-08-27 NOTE — Progress Notes (Signed)
Peripherally Inserted Central Catheter/Midline Placement  The IV Nurse has discussed with the patient and/or persons authorized to consent for the patient, the purpose of this procedure and the potential benefits and risks involved with this procedure.  The benefits include less needle sticks, lab draws from the catheter, and the patient may be discharged home with the catheter. Risks include, but not limited to, infection, bleeding, blood clot (thrombus formation), and puncture of an artery; nerve damage and irregular heartbeat and possibility to perform a PICC exchange if needed/ordered by physician.  Alternatives to this procedure were also discussed.  Bard Power PICC patient education guide, fact sheet on infection prevention and patient information card has been provided to patient /or left at bedside.    PICC/Midline Placement Documentation        Alicia Conley 08/27/2017, 3:06 PM

## 2017-08-27 NOTE — Progress Notes (Signed)
Pharmacy consulted for electrolyte replacement protocol:   Goal of therapy: Electrolytes within normal limits:  K 3.5 - 5.1 Corrected Ca 8.7 - 10.3 Phos 2.5 - 4.6 Mg 1.7 - 2.4   Assessment: Lab Results  Component Value Date   CREATININE 1.46 (H) 08/27/2017   BUN 21 08/27/2017   NA 139 08/27/2017   K 4.3 08/27/2017   CL 111 08/27/2017   CO2 19 (L) 08/27/2017    Plan: K 2.6, ordering KCl 20meq iv q1h x 5 doses. Will reheck with AM labs. Will order Magnesium now and replace as necessary for hypomagnesia. Will order am labs per protocol.  6/19  Mag @ 20:30 = 2.1 No additional mag needed, will recheck electrolytes on 6/20 with AM labs  6/20 K 3.2, Normally would prefer po potassium replacement at this point but she remains NPO I will order KCl 27mEq IV q1h x 3 doses and follow K level in the morning 6/21  6/21, K 3.5, hypokalemia resolved following replacement therapy. However, patient is still having multiple very loose stools per day. I will order a follow-up BMP for 6/22 am  6/22, K 2.9, orders have been placed for KCl 52mEq IV x 6. Will follow up on AM labs,  6/23 K 3.4, ordered KCl 45mEq PO x 1 dose. Follow up on AM labs.  6/24 K 4.6, hypokalemia resolved following replacement therapy. Will follow-up on am labs.  6/25 K 4.3. Pharmacy will continue to follow electrolytes daily through the TPN consult   Vallery Sa, PhamD 08/27/2017 12:14 PM

## 2017-08-27 NOTE — Consult Note (Signed)
Consultation Note Date: 08/27/2017   Patient Name: Alicia Conley  DOB: [birth year]  MRN: [id number]  Age / Sex: 77 y.o., female  PCP: [name], MD Referring Physician: Gorden Harms, MD  Reason for Consultation: Establishing goals of care  HPI/Patient Profile: 77 y.o. female admitted on 08/24/2017 from home with abdominal pain for the last 2.5 weeks. She has a past medical history of hyperlipidemia, hypertension, and hypothyroidism. She had been worked up with [facility] and CT abdomen revealed a partial distal colonic obstruction with a large amount of stool. She was seen at our ER on Saturday and was discharged after evaluation. She continued to have discomfort and unable to eat or drink fluids, which led her to see Dr. Alice Conley (Gastroenterologist). He directly admitted her. She reported her last bowel movement was more than a week prior to admission. Since admission patient has received fluid and electrolyte replacement. She is s/p flexible sigmoidoscopy on 08/29/2017 noted for internal hemorrhoids and diverticulosis. An attempt was made for a diverting colostomy on 08/06/2017 which was aborted given severe carcinomatosis; biopsy was taken. She has been seen by Dr. Mike Gip, Oncologist to evaluate plan of care. Palliative Medicine team consulted for goals of care discussion.   Clinical Assessment and Goals of Care: I have reviewed medical records including lab results, imaging, Epic notes, and MAR, received report from the bedside RN, and assessed the patient. I then met with patient's husband, son-in-law, and granddaughter to discuss diagnosis prognosis, GOC, EOL wishes, disposition and options. Patient was signing consent and preparing to have central line placed for TPN. She was unable to participate in goals of conversation however, suggested for me to continue conversation with family and follow up with  her post discussion.   I introduced Palliative Medicine as specialized medical care for people living with serious illness. It focuses on providing relief from the symptoms and stress of a serious illness. The goal is to improve quality of life for both the patient and the family.  We discussed a brief life review of the patient. 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 She is a woman of [employer]. She has one daughter and a stepson. Her and her husband has been married for 26 years. She loves her grandchildren, reading, and completing puzzles.   As far as functional and nutritional status family reports she is a very independent woman. Prior to admission she was ambulatory without any assistive devices and was still able to drive. She performed all of her ADLs independently. However, over the past 2 weeks patient has been complaining of abdominal pain and discomfort, decrease in appetite, increase fatigue, and family feels as though she may have loss a few pounds due to this. Prior to the abdominal pain, she had a great appetite and would often eat several meals a day with frequent snacks.   We discussed her current illness and what it means in the larger context of her on-going co-morbidities.  Natural disease trajectory  and expectations at EOL were discussed. Family was very unrealistic during the conversation and continued to speak on their [place] and not wanting to discuss the "what ifs" or the "negatives" of her condition. Attempted to discuss that we are always hopeful for the best however, we must also plan for the worst so that they are not caught off guard. Husband stated he had a similar discussion with her last night and she stated they needed to take it one day at a time. He offered to began to complete a living will, HCPOA documentation, and he asked her about what she would want to do in the case of needing CPR. The husband stated she did not  want to hear much about it and refused to discuss advance directives. She also stated she wanted to have CPR and intubation if necessary and see what would happen from there. Husband somewhat tearful and states he is nervous because his first wife also passed away with cancer and was under hospice services prior to her passing away. He states he is familiar with the process. The difference was it seemed easier because they were not given treatment options and only to proceed with hospice and comfort and [name]'s case does sound hopeful after hearing from Oncology. Granddaughter asked very detailed questions, however, again she felt that the team should do all they could do for her grandmother with every available option.   I attempted to elicit values and goals of care important to the patient.    The difference between aggressive medical intervention and comfort care was considered in light of the patient's goals of care. Again family would like for all aggressive measures to be taken. I also spoke with patient after family discussion and she also stated she wanted aggressive measures, including potential chemo and surgery. She is hopeful for stent placement on tomorrow or the next day to assist with obstruction. She is prepared to start TPN and gain some nutritional value.   Advanced directives, concepts specific to code status, artifical feeding and hydration, and rehospitalization were considered and discussed. Patient and family would like to continue with FULL CODE. Patient and family both verbalized their need for aggressive measures including CPR, intubation, artificial feedings etc. We discussed her current condition again and what this would look like in the event of a code and they verbalized their understanding.   Hospice and Palliative Care services outpatient were explained and offered. Patient and family has declined any services at this time as they stated they did not want to make any  decisions about her care until they had the biopsy results and detailed conversation with Oncology and also see how the surgery goes (stent placement).   Questions and concerns were addressed. The family was encouraged to call with questions or concerns.  PMT will continue to support holistically.  Primary Decision Maker: Patient is able to make health care decision. She is alert and oriented x3.   NEXT OF KIN-Larry Mader (husband)     SUMMARY OF RECOMMENDATIONS    FULL CODE -at patient/family's request. Despite knowing the extent of her current condition. Patient would like all aggressive measures.   Continue to treat the treatable. Patient and family is relying on improvement once she is able to have stent placement, biopsy results, and have detailed conversation with Oncology regarding plan of care.   Patient/family would not like to discuss or set up outpatient palliative vs. Hospice at this time. Will continue to have crucial  conversations once they have more information on her condition in regards to Oncology.   Would recommend venting gastrostomy tube at some point: as this will be beneficial in future to help with symptoms and medication. This is known to be of benefit with patients with known peritoneal carcinomatosis and management of the potential for high risk malignant bowel obstructions.   If patient develops uncontrolled nausea would recommend scheduled reglan, zofran and compazine for support.   Also would recommend the use of octreotide, decadron, and high dose PPI or H2 antagonist if needed.   Palliative Medicine team will continue to support patient, family, and medical team during hospitalization.   Code Status/Advance Care Planning:  Full code-at patient/family's request   Palliative Prophylaxis:   Aspiration, Bowel Regimen and Frequent Pain Assessment  Additional Recommendations (Limitations, Scope, Preferences):  Full Scope Treatment-at patient's request.  Continue to treat the treatable with aggressive measures.   Psycho-social/Spiritual:   Desire for further Chaplaincy support:NO   Additional Recommendations: Will continue to have crucial discussion regarding goals of care with patient and family.   Prognosis:   Unable to determine-guarded to poor in the setting of poor po intake, TPN initiation, decreased mobility, advanced peritoneal carcinomatosis, partial bowel obstruction, unsuccessful attempt for diverting colostomy, obstipation, hypertension, and hyperlipidemia.   Discharge Planning: To Be Determined      Primary Diagnoses: Present on Admission: . Abdominal pain . Ileus ([place])   I have reviewed the medical record, interviewed the patient and family, and examined the patient. The following aspects are pertinent.  Past Medical History:  Diagnosis Date  . High cholesterol   . Hypertension   . Thyroid disease    Social History   Socioeconomic History  . Marital status: Married    Spouse name: Not on file  . Number of children: Not on file  . Years of education: Not on file  . Highest education level: Not on file  Occupational History  . Not on file  Social Needs  . Financial resource strain: Not on file  . Food insecurity:    Worry: Not on file    Inability: Not on file  . Transportation needs:    Medical: Not on file    Non-medical: Not on file  Tobacco Use  . Smoking status: Never Smoker  . Smokeless tobacco: Never Used  Substance and Sexual Activity  . Alcohol use: Never    Frequency: Never  . Drug use: Never  . Sexual activity: Never  Lifestyle  . Physical activity:    Days per week: Not on file    Minutes per session: Not on file  . Stress: Not on file  Relationships  . Social connections:    Talks on phone: Not on file    Gets together: Not on file    Attends religious service: Not on file    Active member of club or organization: Not on file    Attends meetings of clubs or organizations: Not  on file    Relationship status: Not on file  Other Topics Concern  . Not on file  Social History Narrative  . Not on file   Family History  Problem Relation Age of Onset  . Breast cancer Paternal Aunt 100  . Breast cancer Cousin 37       mat cousin   Scheduled Meds: . aspirin EC  81 mg Oral Daily  . atorvastatin  80 mg Oral Daily  . enoxaparin (LOVENOX) injection  40 mg Subcutaneous Q24H  .  feeding supplement  1 Container Oral TID WC  . insulin aspart  0-9 Units Subcutaneous Q6H  . levothyroxine  75 mcg Oral QAC breakfast  . metoprolol tartrate  100 mg Oral BID  . sertraline  100 mg Oral Daily  . sodium chloride flush  10-40 mL Intracatheter Q12H   Continuous Infusions: . Marland KitchenTPN (CLINIMIX-E) Adult    . sodium chloride    . cefOXitin Stopped (08/27/17 1144)  . dextrose 5 % and 0.45 % NaCl with KCl 20 mEq/L 75 mL/hr at 08/27/17 1118  . famotidine (PEPCID) IV Stopped (08/26/17 2109)  . Fat emulsion    . sodium chloride 1,000 mL (08/27/17 1530)   PRN Meds:.acetaminophen **OR** acetaminophen, alum & mag hydroxide-simeth, bisacodyl, fluticasone, hydrALAZINE, HYDROmorphone (DILAUDID) injection, morphine injection, ondansetron **OR** ondansetron (ZOFRAN) IV, sodium chloride flush Medications Prior to Admission:  Prior to Admission medications   Medication Sig Start Date End Date Taking? Authorizing Provider  Aspirin (ASPIR-81 PO) Take 1 tablet by mouth 2 (two) times daily.   Yes [provider]  atorvastatin (LIPITOR) 80 MG tablet Take 80 mg by mouth daily.   Yes [provider]  Biotin 1 MG CAPS Take 1 mg by mouth 3 (three) times daily.   Yes [provider]  cholecalciferol (VITAMIN D) 1000 units tablet Take 1,000 Units by mouth daily.   Yes [provider]  Coenzyme Q10 (CO Q 10) 100 MG CAPS Take 1 capsule by mouth daily.   Yes [provider]  cyclobenzaprine (FLEXERIL) 5 MG tablet Take 5 mg by mouth at bedtime as needed for muscle  spasms.   Yes [provider]  enalapril (VASOTEC) 20 MG tablet Take 20 mg by mouth 2 (two) times daily.   Yes [provider]  fluticasone (FLONASE) 50 MCG/ACT nasal spray Place 1 spray into both nostrils daily as needed for allergies or rhinitis.   Yes [provider]  hydrochlorothiazide (HYDRODIURIL) 25 MG tablet Take 25 mg by mouth daily.   Yes [provider]  levothyroxine (SYNTHROID, LEVOTHROID) 75 MCG tablet Take 75 mcg by mouth daily before breakfast.   Yes [provider]  metoprolol (LOPRESSOR) 100 MG tablet Take 100 mg by mouth 2 (two) times daily.   Yes [provider]  omeprazole (PRILOSEC) 20 MG capsule Take 20 mg by mouth daily.    Yes [provider]  sertraline (ZOLOFT) 100 MG tablet Take 100 mg by mouth daily.   Yes [provider]  vitamin B-12 (CYANOCOBALAMIN) 1000 MCG tablet Take 1,000 mcg by mouth daily.   Yes [provider]   No Known Allergies Review of Systems  HENT:       Dry mouth   Gastrointestinal: Positive for abdominal pain.  Neurological: Positive for weakness.    Physical Exam  Constitutional: She is oriented to person, place, and time. Vital signs are normal. She appears well-developed. She is cooperative. She has a sickly appearance.  Cardiovascular: Normal rate, regular rhythm, normal heart sounds, intact distal pulses and normal pulses.  Pulmonary/Chest: Effort normal and breath sounds normal.  Abdominal: Normal appearance. Bowel sounds are decreased.  Neurological: She is alert and oriented to person, place, and time.  Psychiatric: She has a normal mood and affect. Her speech is normal and behavior is normal. Judgment and thought content normal. Cognition and memory are normal.  Nursing note and vitals reviewed.   Vital Signs: BP 134/66 (BP Location: Right Arm)   Pulse (!) 106   Temp  99.8 F (37.7 C) (Axillary)   Resp 14   Ht 5' 5" (1.651 m)   Wt 127.4 kg (280 lb  13.9 oz)   SpO2 93%   BMI 46.74 kg/m  Pain Scale: 0-10 POSS *See Group Information*: 1-Acceptable,Awake and alert Pain Score: 4    SpO2: SpO2: 93 % O2 Device:SpO2: 93 % O2 Flow Rate: .O2 Flow Rate (L/min): 1 L/min  IO: Intake/output summary:   Intake/Output Summary (Last 24 hours) at 08/27/2017 1615 Last data filed at 08/27/2017 0743 Gross per 24 hour  Intake 1367 ml  Output 585 ml  Net 782 ml    LBM: Last BM Date: 08/19/2017 Baseline Weight: Weight: 126.9 kg (279 lb 12.2 oz) Most recent weight: Weight: 127.4 kg (280 lb 13.9 oz)     Palliative Assessment/Data: PPS 50 % NPO    Time In: 1515 Time Out: 1630 Time Total: 75 min.   Greater than 50%  of this time was spent counseling and coordinating care related to the above assessment and plan.  Signed by:  Alda Lea, NP-BC Palliative Medicine Team  Phone: [phone] Fax: [phone] Pager: [phone] Amion: Bjorn Pippin    Please contact Palliative Medicine Team phone at [phone] for questions and concerns.  For individual provider: See Shea Evans

## 2017-08-27 NOTE — Progress Notes (Signed)
[facility] NOTE   Pharmacy Consult for TPN Indication: Inadequate oral intake related to poor appetite, nausea, vomiting   Patient Measurements: Height: 5\' 5"  (165.1 cm) Weight: 284 lb 13.4 oz (129.2 kg) IBW/kg (Calculated) : 57 TPN AdjBW (KG): 74.5 Body mass index is 47.4 kg/m.  Assessment:   GI:  Endo:  Insulin requirements in the past 24 hours: none Lytes: Renal: Pulm: Cards:  Hepatobil: Neuro: ID:  TPN Access: central line ordered and expected to be in place by 1800 TPN start date: 08/27/17 Nutritional Goals (per RD recommendation on 08/27/17): KCal: 2014 Protein: 100g Fluid: 2292  Goal TPN rate is 83 ml/hr  Current Nutrition:   Plan:  E 5/15 TPN at 53mL/hr. This TPN provides 50 g of protein, 75 g of dextrose, and 60 g of lipids which provides 888 kCals per day, meeting 44% of patient needs Electrolytes in TPN: no additional Add MVI, trace elements NS at 35 ml/hr Monitor TPN labs F/U 6/26  Dallie Piles, PharmD 08/27/2017,1:31 PM

## 2017-08-27 NOTE — Progress Notes (Signed)
[facility] Hospital Day(s): 6.   Post op day(s): 2 Days Post-Op.   Interval History: Patient seen and examined, no acute events or new complaints overnight. Patient reports mild LLQ peri-incisional and peri-drain abdominal pain, denies N/V, fever/chills, CP, or SOB, continues to (understandably) have/ask many questions, along with her family likewise.  Review of Systems:  Constitutional: denies fever, chills  Respiratory: denies any shortness of breath  Cardiovascular: denies chest pain or palpitations  Gastrointestinal: abdominal pain, N/V, and bowel function as per interval history Musculoskeletal: denies pain, decreased motor or sensation Integumentary: denies any other rashes or skin discolorations except post-surgical abdominal wounds  Vital signs in last 24 hours: [min-max] current  Temp:  [98.2 F (36.8 C)-98.8 F (37.1 C)] 98.2 F (36.8 C) (06/25 0401) Pulse Rate:  [74-88] 88 (06/25 0401) Resp:  [14-20] 14 (06/25 0401) BP: (104-153)/(48-55) 142/52 (06/25 0401) SpO2:  [96 %-98 %] 96 % (06/25 0401) Weight:  [284 lb 13.4 oz (129.2 kg)] 284 lb 13.4 oz (129.2 kg) (06/25 0405)     Height: 5\' 5"  (165.1 cm) Weight: 284 lb 13.4 oz (129.2 kg) BMI (Calculated): 47.4   Intake/Output this shift:  No intake/output data recorded.   Intake/Output last 2 shifts:  @IOLAST2SHIFTS @   Physical Exam:  Constitutional: alert, cooperative and no distress  Respiratory: breathing non-labored at rest  Cardiovascular: regular rate and sinus rhythm  Gastrointestinal: soft and morbidly obese with mild LLQ peri-incisional and peri-drain tenderness to palpation with wound VAC intact without surrounding erythema or drainage  Labs:  CBC Latest Ref Rng & Units 08/27/2017 08/26/2017 08/22/2017  WBC 3.6 - 11.0 K/uL 14.8(H) 20.1(H) 10.6  Hemoglobin 12.0 - 16.0 g/dL 11.3(L) 11.8(L) 12.0  Hematocrit 35.0 - 47.0 % 34.4(L) 36.4 35.7  Platelets 150 - 440 K/uL 329 336 322   CMP Latest Ref Rng  & Units 08/27/2017 08/26/2017 08/05/2017  Glucose 70 - 99 mg/dL 84 128(H) 80  BUN 8 - 23 mg/dL 21 14 10   Creatinine 0.44 - 1.00 mg/dL 1.46(H) 1.16(H) 0.85  Sodium 135 - 145 mmol/L 139 138 140  Potassium 3.5 - 5.1 mmol/L 4.3 4.6 3.4(L)  Chloride 98 - 111 mmol/L 111 111 114(H)  CO2 22 - 32 mmol/L 19(L) 20(L) 21(L)  Calcium 8.9 - 10.3 mg/dL 7.7(L) 7.8(L) 7.9(L)  Total Protein 6.5 - 8.1 g/dL 5.9(L) - -  Total Bilirubin 0.3 - 1.2 mg/dL 0.6 - -  Alkaline Phos 38 - 126 U/L 61 - -  AST 15 - 41 U/L 43(H) - -  ALT 0 - 44 U/L 22 - -   Imaging studies: No new pertinent imaging studies   Assessment/Plan: 77 y.o. female with distal large bowel obstruction attributed to external compression by extensive diffuse peritoneal carcinomatosis 2 Days Post-Op s/p laparotomy with unsuccessful attempted colostomy instead with decompressing colon-drainage tube, complicated by decreasing post-surgical leukocytosis and by comorbidities including morbid obesity (BMI 47), HTN, hypercholesterolemia, thyroid disease (not otherwise specified), and chronic back pain.              - follow up pending pathology             - pain control prn (minimize narcotics)             - NPO with IV fluid, PICC placed for TPN             - monitor ongoing abdominal exam and bowel function             -  palliative decompressing distal colonic stent discussed with Dr. Alice Reichert             - attempted to answer as many of patient's and her family's questions as possible             - patient discussed with Dr. Mike Gip (covering for Dr. Tasia Catchings) and Dr. Jerelyn Charles             - medical management of comorbidities             - DVT prophylaxis, ambulation  All of the above findings and recommendations were discussed with the patient, patient's family, and the medical team, and all of patient's and family's questions were answered to their expressed satisfaction.   Thank you for the opportunity to participate in this patient's care.    -- Marilynne Drivers Rosana Hoes, MD, [facility] - Partnering for exceptional care. Office: [phone]

## 2017-08-27 NOTE — Progress Notes (Signed)
Alicia Conley NAME: Alicia Conley    MR#:  [id number]  DATE OF BIRTH:  20-Jan-1941  SUBJECTIVE:  CHIEF COMPLAINT:  No chief complaint on file. Complains of dry mouth only, husband at the bedside, case discussed with general surgery, nursing staff, and palliative care.  For colonic stent placement by gastroenterology, oncology input appreciated  REVIEW OF SYSTEMS:  CONSTITUTIONAL: No fever, fatigue or weakness.  EYES: No blurred or double vision.  EARS, NOSE, AND THROAT: No tinnitus or ear pain.  RESPIRATORY: No cough, shortness of breath, wheezing or hemoptysis.  CARDIOVASCULAR: No chest pain, orthopnea, edema.  GASTROINTESTINAL: No nausea, vomiting, diarrhea or abdominal pain.  GENITOURINARY: No dysuria, hematuria.  ENDOCRINE: No polyuria, nocturia,  HEMATOLOGY: No anemia, easy bruising or bleeding SKIN: No rash or lesion. MUSCULOSKELETAL: No joint pain or arthritis.   NEUROLOGIC: No tingling, numbness, weakness.  PSYCHIATRY: No anxiety or depression.   ROS  DRUG ALLERGIES:  No Known Allergies  VITALS:  Blood pressure 134/66, pulse (!) 106, temperature 99.8 F (37.7 C), temperature source Axillary, resp. rate 14, height 5\' 5"  (1.651 m), weight 129.2 kg (284 lb 13.4 oz), SpO2 93 %.  PHYSICAL EXAMINATION:  GENERAL:  77 y.o.-year-old patient lying in the bed with no acute distress.  EYES: Pupils equal, round, reactive to light and accommodation. No scleral icterus. Extraocular muscles intact.  HEENT: Head atraumatic, normocephalic. Oropharynx and nasopharynx clear.  NECK:  Supple, no jugular venous distention. No thyroid enlargement, no tenderness.  LUNGS: Normal breath sounds bilaterally, no wheezing, rales,rhonchi or crepitation. No use of accessory muscles of respiration.  CARDIOVASCULAR: S1, S2 normal. No murmurs, rubs, or gallops.  ABDOMEN: Soft, nontender, nondistended. Bowel sounds present. No organomegaly or mass.   EXTREMITIES: No pedal edema, cyanosis, or clubbing.  NEUROLOGIC: Cranial nerves II through XII are intact. Muscle strength 5/5 in all extremities. Sensation intact. Gait not checked.  PSYCHIATRIC: The patient is alert and oriented x 3.  SKIN: No obvious rash, lesion, or ulcer.   Physical Exam LABORATORY PANEL:   CBC Recent Labs  Lab 08/27/17 0420  WBC 14.8*  HGB 11.3*  HCT 34.4*  PLT 329   ------------------------------------------------------------------------------------------------------------------  Chemistries  Recent Labs  Lab 08/27/17 0420  NA 139  K 4.3  CL 111  CO2 19*  GLUCOSE 84  BUN 21  CREATININE 1.46*  CALCIUM 7.7*  MG 1.7  AST 43*  ALT 22  ALKPHOS 61  BILITOT 0.6   ------------------------------------------------------------------------------------------------------------------  Cardiac Enzymes No results for input(s): TROPONINI in the last 168 hours. ------------------------------------------------------------------------------------------------------------------  RADIOLOGY:  Dg Abd Acute W/chest  Result Date: 08/26/2017 CLINICAL DATA:  Follow-up distal colonic obstruction. EXAM: DG ABDOMEN ACUTE W/ 1V CHEST COMPARISON:  August 25, 2017 FINDINGS: There is a small left effusion. The chest is otherwise stable. Lucency over the upper abdomen on the upright view may represent free postoperative air or air within the skin folder incision. Free air would be expected after surgery yesterday. The colonic loops are significantly improved with placement of a colonic tube. No other change. IMPRESSION: 1. A colonic tube is been placed with decompression of the colon. 2. Probable postoperative free air, as expected. 3. No other change. Electronically Signed   By: Dorise Bullion III M.D   On: 08/26/2017 08:33   [place] Ekg Site Rite  Result Date: 08/27/2017 If Site Rite image not attached, placement could not be confirmed due to current cardiac rhythm.   ASSESSMENT  AND  PLAN:  77 year old female patient with history of hypertension, hyperlipidemia, hypothyroidism referred from GI clinic for direct admission for abdominal discomfort.  *Acute carcinomatosis Etiology unknown Status post flexible sigmoidoscopy noted for internal hemorrhoids/diverticulosis August 23, 2017 Status post attempted diverting colostomy on August 25, 2017 which was aborted given severe carcinomatosis-sample of cancer taken and sent to lab for pathology review, discussed with gastroenterology-plans for possible colonic stent placement when available, oncology planning for possible palliative chemotherapy  *AcuteObstipation Stable Status post flexible sigmoidoscopy noted for internal hemorrhoids/diverticulosis August 23, 2017 as well as attempted diverting colostomy on August 25, 2017 Plan of care as stated above   *Acute Hypokalemia Repleted  *Acute hypomagnesemia Repleted with IV magnesium  *Chronic benign essential hypertension Stable Continue current regiment  * ChronicHyperlipidemia Stable Resume statinupon discharge  *Acute dehydration Resolved with IV fluids for rehydration   Condition stable Prognosis terminal-palliative care consulted Disposition pending clinical course   All the records are reviewed and case discussed with Care Management/Social Workerr. Management plans discussed with the patient, family and they are in agreement.  CODE STATUS: full  TOTAL TIME TAKING CARE OF THIS PATIENT: 35 minutes.     POSSIBLE D/C IN 11-5 DAYS, DEPENDING ON CLINICAL CONDITION.   Avel Peace Salary M.D on 08/27/2017   Between 7am to 6pm - Pager - [phone]  After 6pm go to [email or web address] - password [email or web address] [facility]  Office  [phone]  CC: Primary care physician; [name], MD  Note: This dictation was prepared with Dragon dictation along with smaller phrase technology. Any transcriptional errors that result from  this process are unintentional.

## 2017-08-28 ENCOUNTER — Inpatient Hospital Stay: Payer: Medicare HMO

## 2017-08-28 ENCOUNTER — Inpatient Hospital Stay: Payer: Medicare HMO | Admitting: Anesthesiology

## 2017-08-28 ENCOUNTER — Encounter: Payer: Self-pay | Admitting: *Deleted

## 2017-08-28 ENCOUNTER — Encounter: Admission: AD | Disposition: E | Payer: Self-pay | Source: Home / Self Care | Attending: Family Medicine

## 2017-08-28 DIAGNOSIS — Z7189 Other specified counseling: Secondary | ICD-10-CM

## 2017-08-28 DIAGNOSIS — J9601 Acute respiratory failure with hypoxia: Secondary | ICD-10-CM

## 2017-08-28 DIAGNOSIS — K567 Ileus, unspecified: Secondary | ICD-10-CM

## 2017-08-28 DIAGNOSIS — Z515 Encounter for palliative care: Secondary | ICD-10-CM

## 2017-08-28 HISTORY — PX: COLONIC STENT PLACEMENT: SHX5542

## 2017-08-28 HISTORY — PX: FLEXIBLE SIGMOIDOSCOPY: SHX5431

## 2017-08-28 LAB — CBC
HCT: 34.3 % — ABNORMAL LOW (ref 35.0–47.0)
Hemoglobin: 11.3 g/dL — ABNORMAL LOW (ref 12.0–16.0)
MCH: 29.3 pg (ref 26.0–34.0)
MCHC: 33 g/dL (ref 32.0–36.0)
MCV: 88.7 fL (ref 80.0–100.0)
PLATELETS: 367 10*3/uL (ref 150–440)
RBC: 3.86 MIL/uL (ref 3.80–5.20)
RDW: 16.1 % — ABNORMAL HIGH (ref 11.5–14.5)
WBC: 16.6 10*3/uL — ABNORMAL HIGH (ref 3.6–11.0)

## 2017-08-28 LAB — BLOOD GAS, ARTERIAL
ACID-BASE DEFICIT: 8.9 mmol/L — AB (ref 0.0–2.0)
Bicarbonate: 20.2 mmol/L (ref 20.0–28.0)
FIO2: 0.5
MECHVT: 500 mL
O2 SAT: 94.8 %
PATIENT TEMPERATURE: 37
PCO2 ART: 58 mmHg — AB (ref 32.0–48.0)
PEEP: 5 cmH2O
PO2 ART: 95 mmHg (ref 83.0–108.0)
RATE: 14 resp/min
pH, Arterial: 7.15 — CL (ref 7.350–7.450)

## 2017-08-28 LAB — POTASSIUM: Potassium: 4.1 mmol/L (ref 3.5–5.1)

## 2017-08-28 LAB — CREATININE, SERUM
CREATININE: 1.59 mg/dL — AB (ref 0.44–1.00)
GFR calc non Af Amer: 30 mL/min — ABNORMAL LOW (ref >60)
GFR, EST AFRICAN AMERICAN: 35 mL/min — AB (ref >60)

## 2017-08-28 LAB — GLUCOSE, CAPILLARY
GLUCOSE-CAPILLARY: 110 mg/dL — AB (ref 70–99)
GLUCOSE-CAPILLARY: 115 mg/dL — AB (ref 70–99)
GLUCOSE-CAPILLARY: 138 mg/dL — AB (ref 70–99)
Glucose-Capillary: 122 mg/dL — ABNORMAL HIGH (ref 70–99)
Glucose-Capillary: 139 mg/dL — ABNORMAL HIGH (ref 70–99)
Glucose-Capillary: 186 mg/dL — ABNORMAL HIGH (ref 70–99)

## 2017-08-28 LAB — PHOSPHORUS: Phosphorus: 3.1 mg/dL (ref 2.5–4.6)

## 2017-08-28 LAB — MAGNESIUM: MAGNESIUM: 1.8 mg/dL (ref 1.7–2.4)

## 2017-08-28 SURGERY — SIGMOIDOSCOPY, FLEXIBLE
Anesthesia: General

## 2017-08-28 MED ORDER — MIDAZOLAM HCL 2 MG/2ML IJ SOLN
INTRAMUSCULAR | Status: DC | PRN
  Administered 2017-08-28: 2 mg via INTRAVENOUS

## 2017-08-28 MED ORDER — SEVOFLURANE IN SOLN
RESPIRATORY_TRACT | Status: AC
  Filled 2017-08-28: qty 250

## 2017-08-28 MED ORDER — DEXAMETHASONE SODIUM PHOSPHATE 10 MG/ML IJ SOLN
INTRAMUSCULAR | Status: DC | PRN
  Administered 2017-08-28: 6 mg via INTRAVENOUS

## 2017-08-28 MED ORDER — PROPOFOL 10 MG/ML IV BOLUS
INTRAVENOUS | Status: DC | PRN
  Administered 2017-08-28: 50 mg via INTRAVENOUS

## 2017-08-28 MED ORDER — FAT EMULSION PLANT BASED 20 % IV EMUL
250.0000 mL | INTRAVENOUS | Status: DC
  Administered 2017-08-28: 250 mL via INTRAVENOUS
  Filled 2017-08-28: qty 250

## 2017-08-28 MED ORDER — TRACE MINERALS CR-CU-MN-SE-ZN 10-1000-500-60 MCG/ML IV SOLN
INTRAVENOUS | Status: AC
  Administered 2017-08-28: 18:00:00 via INTRAVENOUS
  Filled 2017-08-28: qty 1992

## 2017-08-28 MED ORDER — ONDANSETRON HCL 4 MG/2ML IJ SOLN
INTRAMUSCULAR | Status: DC | PRN
  Administered 2017-08-28: 4 mg via INTRAVENOUS

## 2017-08-28 MED ORDER — FENTANYL 2500MCG IN NS 250ML (10MCG/ML) PREMIX INFUSION
0.0000 ug/h | INTRAVENOUS | Status: DC
  Administered 2017-08-28: 25 ug/h via INTRAVENOUS
  Filled 2017-08-28: qty 250

## 2017-08-28 MED ORDER — IOTHALAMATE MEGLUMINE 60 % INJ SOLN
INTRAMUSCULAR | Status: DC | PRN
  Administered 2017-08-28: 40 mL

## 2017-08-28 MED ORDER — LIDOCAINE HCL (CARDIAC) PF 100 MG/5ML IV SOSY
PREFILLED_SYRINGE | INTRAVENOUS | Status: DC | PRN
  Administered 2017-08-28: 40 mg via INTRAVENOUS

## 2017-08-28 MED ORDER — MIDAZOLAM HCL 2 MG/2ML IJ SOLN
INTRAMUSCULAR | Status: AC
  Filled 2017-08-28: qty 2

## 2017-08-28 MED ORDER — SODIUM CHLORIDE 0.9 % IV SOLN
INTRAVENOUS | Status: DC
  Administered 2017-08-28: 17:00:00 via INTRAVENOUS

## 2017-08-28 MED ORDER — SUCCINYLCHOLINE CHLORIDE 200 MG/10ML IV SOSY
PREFILLED_SYRINGE | INTRAVENOUS | Status: DC | PRN
  Administered 2017-08-28: 50 mg via INTRAVENOUS

## 2017-08-28 MED ORDER — ROCURONIUM BROMIDE 100 MG/10ML IV SOLN
INTRAVENOUS | Status: DC | PRN
  Administered 2017-08-28: 10 mg via INTRAVENOUS
  Administered 2017-08-28 (×2): 20 mg via INTRAVENOUS
  Administered 2017-08-28: 30 mg via INTRAVENOUS

## 2017-08-28 MED ORDER — PROPOFOL 10 MG/ML IV BOLUS
INTRAVENOUS | Status: AC
  Filled 2017-08-28: qty 40

## 2017-08-28 NOTE — Progress Notes (Signed)
[facility] Day(s): 7.   Post op day(s): 3 Days Post-Op.   Interval History: Patient seen and examined, no acute events or new complaints overnight. Patient reports improved mild LLQ peri-incisional pain, denies N/V, fever/chills, CP, or SOB, expresses anxiety regarding colonoscopic stent placement later today.  Review of Systems:  Constitutional: denies fever, chills  Respiratory: denies any shortness of breath  Cardiovascular: denies chest pain or palpitations  Gastrointestinal: abdominal pain, N/V, and bowel function as per interval history Musculoskeletal: denies pain, decreased motor or sensation Integumentary: denies any other rashes or skin discolorations except post-surgical wounds  Vital signs in last 24 hours: [min-max] current  Temp:  [98.2 F (36.8 C)-99.8 F (37.7 C)] 98.2 F (36.8 C) (06/26 0844) Pulse Rate:  [106-115] 110 (06/26 0844) Resp:  [18-20] 20 (06/26 0844) BP: (134-162)/(57-66) 161/57 (06/26 0844) SpO2:  [90 %-93 %] 93 % (06/26 0844) Weight:  [280 lb 13.9 oz (127.4 kg)] 280 lb 13.9 oz (127.4 kg) (06/25 1325)     Height: 5\' 5"  (165.1 cm) Weight: 280 lb 13.9 oz (127.4 kg) BMI (Calculated): 46.74   Intake/Output this shift:  Total I/O In: 90 [Other:90] Out: -    Intake/Output last 2 shifts:  @IOLAST2SHIFTS @   Physical Exam:  Constitutional: alert, cooperative and no distress  Respiratory: breathing non-labored at rest  Cardiovascular: regular rate and sinus rhythm  Gastrointestinal: soft and morbidly obese with mild LLQ peri-incisional and peri-drain tenderness to palpation with wound VAC intact without surrounding erythema or drainage  Labs:  CBC Latest Ref Rng & Units 08/19/2017 08/27/2017 08/26/2017  WBC 3.6 - 11.0 K/uL 16.6(H) 14.8(H) 20.1(H)  Hemoglobin 12.0 - 16.0 g/dL 11.3(L) 11.3(L) 11.8(L)  Hematocrit 35.0 - 47.0 % 34.3(L) 34.4(L) 36.4  Platelets 150 - 440 K/uL 367 329 336   CMP Latest Ref Rng & Units 08/10/2017  08/27/2017 08/26/2017  Glucose 70 - 99 mg/dL - 84 128(H)  BUN 8 - 23 mg/dL - 21 14  Creatinine 0.44 - 1.00 mg/dL 1.59(H) 1.46(H) 1.16(H)  Sodium 135 - 145 mmol/L - 139 138  Potassium 3.5 - 5.1 mmol/L 4.1 4.3 4.6  Chloride 98 - 111 mmol/L - 111 111  CO2 22 - 32 mmol/L - 19(L) 20(L)  Calcium 8.9 - 10.3 mg/dL - 7.7(L) 7.8(L)  Total Protein 6.5 - 8.1 g/dL - 5.9(L) -  Total Bilirubin 0.3 - 1.2 mg/dL - 0.6 -  Alkaline Phos 38 - 126 U/L - 61 -  AST 15 - 41 U/L - 43(H) -  ALT 0 - 44 U/L - 22 -   CEA (08/20/2017): 2.8 (WNL) CA-125 (08/08/2017: 93.8 (abnormally elevated)  Imaging studies: No new pertinent imaging studies   Assessment/Plan: 77 y.o.femalewith distal large bowel obstruction attributed to external compression by extensive diffuse peritoneal carcinomatosis2 Days Post-Ops/p laparotomy with unsuccessful attempted colostomy instead with decompressing colon-drainage tube, complicated bydecreasing post-surgical leukocytosis and bycomorbidities includingmorbid obesity (BMI 47), HTN, hypercholesterolemia, thyroid disease (not otherwise specified), and chronic back pain.  - follow up pending pathology - pain control prn (minimize narcotics) - NPO with IV fluid, PICC placed for TPN - monitor ongoing abdominal exam and bowel function - palliative decompressing distal colonic stent as per Dr. Alice Reichert  - if ovarian etiology determined, recommend gyn oncology consultation - attempted to answer as many of patient's and her family's questions as possible - patient discussed with Dr. Mike Gip (covering for Dr. Tasia Catchings) and Dr. Jerelyn Charles - medical management of comorbidities - DVT prophylaxis, ambulation  All of the  above findings and recommendations were discussed with the patient, patient's family, and the medical team, and all of patient's and family's questions were answered to their  expressed satisfaction.   Thank you for the opportunity to participate in this patient's care.  -- Marilynne Drivers Rosana Hoes, MD, [place]: [facility] - Partnering for exceptional care. Office: [phone]

## 2017-08-28 NOTE — Care Management (Signed)
Per attending MD do not pursue LTACH at this time

## 2017-08-28 NOTE — Consult Note (Addendum)
Name: Alicia Conley MRN: [id number] DOB: 24-Jun-1940     CONSULTATION DATE: 08/05/2017  REFERRING MD :  Jerelyn Charles  CHIEF COMPLAINT:  resp failure     HISTORY OF PRESENT ILLNESS:   77 y.o. female with distal large bowel obstruction attributed to external compression by extensive diffuse peritoneal carcinomatosis  Post-Op s/p laparotomy with unsuccessful attempted colostomy instead with decompressing colon-drainage tube, complicated by post-surgical leukocytosis and by comorbidities including morbid obesity  Gi was consulted for colonic stent placement  Patient underwent FLex Sig today for colonic stent and subsequently developed acute and severe resp failure from acute aspiration pneumonitis Patient was intubated and transferred to ICU   Patient seen and patient is critically ill prognosis is very poor  +vent support +wheezing on exam.    PAST MEDICAL HISTORY :   has a past medical history of Cancer ([place]), High cholesterol, Hypertension, and Thyroid disease.  has a past surgical history that includes Oophorectomy (Bilateral); Cardiac catheterization (N/A, 04/03/2016); Breast cyst aspiration (Left); Breast biopsy (Left, 11/07/12); Hernia repair; Flexible sigmoidoscopy (N/A, 08/17/2017); and laparotomy (N/A, 08/12/2017). Prior to Admission medications   Medication Sig Start Date End Date Taking? Authorizing Provider  Aspirin (ASPIR-81 PO) Take 1 tablet by mouth 2 (two) times daily.   Yes [provider]  atorvastatin (LIPITOR) 80 MG tablet Take 80 mg by mouth daily.   Yes [provider]  Biotin 1 MG CAPS Take 1 mg by mouth 3 (three) times daily.   Yes [provider]  cholecalciferol (VITAMIN D) 1000 units tablet Take 1,000 Units by mouth daily.   Yes [provider]  Coenzyme Q10 (CO Q 10) 100 MG CAPS Take 1 capsule by mouth daily.   Yes [provider]  cyclobenzaprine (FLEXERIL) 5 MG tablet Take 5 mg by mouth at bedtime as needed for  muscle spasms.   Yes [provider]  enalapril (VASOTEC) 20 MG tablet Take 20 mg by mouth 2 (two) times daily.   Yes [provider]  fluticasone (FLONASE) 50 MCG/ACT nasal spray Place 1 spray into both nostrils daily as needed for allergies or rhinitis.   Yes [provider]  hydrochlorothiazide (HYDRODIURIL) 25 MG tablet Take 25 mg by mouth daily.   Yes [provider]  levothyroxine (SYNTHROID, LEVOTHROID) 75 MCG tablet Take 75 mcg by mouth daily before breakfast.   Yes [provider]  metoprolol (LOPRESSOR) 100 MG tablet Take 100 mg by mouth 2 (two) times daily.   Yes [provider]  omeprazole (PRILOSEC) 20 MG capsule Take 20 mg by mouth daily.    Yes [provider]  sertraline (ZOLOFT) 100 MG tablet Take 100 mg by mouth daily.   Yes [provider]  vitamin B-12 (CYANOCOBALAMIN) 1000 MCG tablet Take 1,000 mcg by mouth daily.   Yes [provider]   No Known Allergies  FAMILY HISTORY:  family history includes Breast cancer (age of onset: 84) in her cousin; Breast cancer (age of onset: 34) in her paternal aunt. SOCIAL HISTORY:  reports that she has never smoked. She has never used smokeless tobacco. She reports that she does not drink alcohol or use drugs.  REVIEW OF SYSTEMS:   Unable to obtain due to critical illness   VITAL SIGNS: Temp:  [98.1 F (36.7 C)-99.8 F (37.7 C)] 98.1 F (36.7 C) (06/26 1800) Pulse Rate:  [109-115] 110 (06/26 1800) Resp:  [15-20] 15 (06/26 1800) BP: (156-192)/(50-64) 192/50 (06/26 1800) SpO2:  [90 %-97 %]  97 % (06/26 1800) FiO2 (%):  [50 %] 50 % (06/26 1800)  Physical Examination:  GENERAL:critically ill appearing, +resp distress HEAD: Normocephalic, atraumatic.  EYES: Pupils equal, round, reactive to light.  No scleral icterus.  MOUTH: Moist mucosal membrane. NECK: Supple. No JVD.  PULMONARY: +rhonchi, +wheezing CARDIOVASCULAR: S1 and S2. Regular rate and  rhythm. No murmurs, rubs, or gallops.  GASTROINTESTINAL: Soft, nontender, -distended. No masses. Positive bowel sounds. No hepatosplenomegaly.  MUSCULOSKELETAL: No swelling, clubbing, or edema.  NEUROLOGIC: obtunded SKIN:intact,warm,dry     ASSESSMENT / PLAN: 77 yo morbidly obese white female seen today for severe resp failure from acute aspiration pneumonitis in the setting of end stage abdominal metastatic carcinomatosis and metabolic acidosis with acute renal failure  Severe Hypoxic and Hypercapnic Respiratory Failure -continue Full MV support -continue Bronchodilator Therapy -Wean Fio2 and PEEP as tolerated -will perform SAT/SBTTwhen respiratory parameters are met    NEUROLOGY - intubated and sedated - minimal sedation to achieve a RASS goal: -1  Renal Failure-most likely due to ATN -follow chem 7 -follow UO -continue Foley Catheter-assess need  GI Follow up gen surgery and GI recs   GI PRX TEDS/SCD's   Critical Care Time devoted to patient care services described in this note is 45 minutes.   Overall, patient is critically ill, prognosis is guarded.  Patient with Multiorgan failure and at high risk for cardiac arrest and death.   Prognosis is very poor, patients family seems unreasonable  Will need Oncology and Palliative support  Recommend Hospice and Comfort care measures.  High probability that patient will NOT be going home and NOT being a candidate for chemo/RXT    Corrin Parker, M.D.  [facility]  Medical Director [place] Director [facility] Cardio-Pulmonary Department

## 2017-08-28 NOTE — Anesthesia Preprocedure Evaluation (Signed)
Anesthesia Evaluation  Patient identified by MRN, date of birth, ID band Patient awake    Reviewed: Allergy & Precautions, H&P , NPO status , Patient's Chart, lab work & pertinent test results, reviewed documented beta blocker date and time   History of Anesthesia Complications Negative for: history of anesthetic complications  Airway Mallampati: II  TM Distance: >3 FB Neck ROM: full    Dental  (+) Dental Advidsory Given, Edentulous Upper, Edentulous Lower, Upper Dentures, Lower Dentures   Pulmonary neg shortness of breath, sleep apnea and Continuous Positive Airway Pressure Ventilation , neg COPD, neg recent URI,           Cardiovascular Exercise Tolerance: Good hypertension, (-) angina(-) CAD, (-) Past MI, (-) Cardiac Stents and (-) CABG (-) dysrhythmias (-) Valvular Problems/Murmurs     Neuro/Psych negative neurological ROS  negative psych ROS   GI/Hepatic Neg liver ROS, GERD  ,  Endo/Other  neg diabetesHypothyroidism Morbid obesity  Renal/GU negative Renal ROS  negative genitourinary   Musculoskeletal   Abdominal   Peds  Hematology negative hematology ROS (+)   Anesthesia Other Findings Past Medical History: No date: High cholesterol No date: Hypertension No date: Thyroid disease   Reproductive/Obstetrics negative OB ROS                             Anesthesia Physical  Anesthesia Plan  ASA: III  Anesthesia Plan: General   Post-op Pain Management:    Induction: Intravenous  PONV Risk Score and Plan: 3 and Propofol infusion  Airway Management Planned: Nasal Cannula  Additional Equipment:   Intra-op Plan:   Post-operative Plan:   Informed Consent: I have reviewed the patients History and Physical, chart, labs and discussed the procedure including the risks, benefits and alternatives for the proposed anesthesia with the patient or authorized representative who has  indicated his/her understanding and acceptance.   Dental Advisory Given  Plan Discussed with: Anesthesiologist, CRNA and Surgeon  Anesthesia Plan Comments:         Anesthesia Quick Evaluation

## 2017-08-28 NOTE — Op Note (Signed)
[facility] Gastroenterology Patient Name: Alicia Conley Procedure Date: 08/18/2017 2:40 PM MRN: [id number] Account #: [id number] Date of Birth: 1941/01/13 Admit Type: Outpatient Age: 77 Room: [facility] ENDO ROOM 4 Gender: Female Note Status: Finalized Procedure:            Flexible Sigmoidoscopy Indications:          Colonic obstruction Providers:            Benay Pike. Exander Shaul MD, MD Medicines:            General Anesthesia Procedure:            Pre-Anesthesia Assessment:                       - The risks and benefits of the procedure and the                        sedation options and risks were discussed with the                        patient. All questions were answered and informed                        consent was obtained.                       - Patient identification and proposed procedure were                        verified prior to the procedure by the nurse. The                        procedure was verified in the procedure room.                       - ASA Grade Assessment: IV - A patient with severe                        systemic disease that is a constant threat to life.                       - After reviewing the risks and benefits, the patient                        was deemed in satisfactory condition to undergo the                        procedure.                       - The anesthesia plan was to use general anesthesia.                       After obtaining informed consent, the scope was passed                        under direct vision. The Colonoscope was introduced                        through the anus and advanced to the the left  transverse colon. The flexible sigmoidoscopy was                        technically difficult and complex due to bowel                        stenosis. Successful completion of the procedure was                        aided by changing endoscopes. The quality of the bowel                         preparation was 50 percent obscured. The patient                        tolerated the procedure fairly well. Findings:      The perianal and digital rectal examinations were normal. Pertinent       negatives include normal sphincter tone and no palpable rectal lesions.      An extrinsic severe stenosis measuring 5 cm (in length) x 8 mm (inner       diameter) was found in the mid sigmoid colon and was traversed. For       location marking, one hemostatic clip was successfully placed. Clip       placed 3 cm proximal to presumed stricture/stenosis. This was stented       with a 25 mm x 6 cm WallFlex stent and a 25 mm x 12 cm WallFlex stent       under fluoroscopic guidance. The 6cm stent was placed to bridge the       distal aspect of the stricture given inadequate bridgin of the stricture       from the 12cm stent. The post procedure appearance was satisfactory at       the endo of the procedure with good waisting on flouroscopy in the       mid-distal aspect of the stricture. The scope was withdrawn and the       procedure was terminated.      The procedure was technically difficult and complex. 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 Clip was placed.                        Prosthesis placed.                       - No specimens collected. Recommendation:       - Observe patient in PACU then transfer to CCU.                       - The findings and recommendations were discussed with                        the patient's family. Procedure Code(s):    --- Professional ---                       [phone], Sigmoidoscopy, flexible; with placement of                        endoscopic stent (includes pre- and post-dilation and  guide wire passage, when performed)                       [phone], Intraluminal dilation of strictures and/or                        obstructions (eg, esophagus), radiological supervision                        and  interpretation                       [phone], Unlisted procedure, colon Diagnosis Code(s):    --- Professional ---                       K56.609, Unspecified intestinal obstruction,                        unspecified as to partial versus complete obstruction                       K56.699, Other intestinal obstruction unspecified as to                        partial versus complete obstruction CPT copyright 2017 American Medical Association. All rights reserved. The codes documented in this report are preliminary and upon coder review may  be revised to meet current compliance requirements. Efrain Sella MD, MD 08/03/2017 5:12:34 PM This report has been signed electronically. Number of Addenda: 0 Note Initiated On: 08/24/2017 2:40 PM Total Procedure Duration: 1 hour 55 minutes 16 seconds  Estimated Blood Loss: Estimated blood loss: none.      [facility]

## 2017-08-28 NOTE — Care Management Important Message (Signed)
Copy of signed IM left with patient in room.  

## 2017-08-28 NOTE — Anesthesia Post-op Follow-up Note (Signed)
Anesthesia QCDR form completed.        

## 2017-08-28 NOTE — Progress Notes (Signed)
[place] NOTE   Pharmacy Consult for TPN Indication: Inadequate oral intake related to poor appetite, nausea, vomiting   Patient Measurements: Height: 5\' 5"  (165.1 cm) Weight: 280 lb 13.9 oz (127.4 kg) IBW/kg (Calculated) : 57 TPN AdjBW (KG): 74.5 Body mass index is 46.74 kg/m.  Assessment:   GI:  Endo:  Insulin requirements in the past 24 hours: none Lytes: Renal: Pulm: Cards:  Hepatobil: Neuro: ID:  TPN Access: central line in place TPN start date: 08/27/17 Nutritional Goals (per RD recommendation on 08/27/17): KCal: 2014 Protein: 100g Fluid: 2292  Goal TPN rate is 83 ml/hr  Plan:  E 5/15 TPN at 76mL/hr. This TPN provides 100 g of protein, 150 g of dextrose, and 60 g of lipids which provides 1776 kCals per day, meeting 90% of patient needs Electrolytes in TPN: no additional Add MVI, trace elements, thiamine (final day 6/27) NS will be discontinued Monitor TPN labs F/U 6/27  Dallie Piles, PharmD 08/24/2017,11:42 AM

## 2017-08-28 NOTE — Progress Notes (Signed)
[place] [facility] [place] NAME: Alicia Conley    MR#:  [id number]  DATE OF BIRTH:  06-21-1940  SUBJECTIVE:  CHIEF COMPLAINT:  No chief complaint on file. Patient without complaint, no events overnight per nursing staff, for possible colonic stent placement later today  REVIEW OF SYSTEMS:  CONSTITUTIONAL: No fever, fatigue or weakness.  EYES: No blurred or double vision.  EARS, NOSE, AND THROAT: No tinnitus or ear pain.  RESPIRATORY: No cough, shortness of breath, wheezing or hemoptysis.  CARDIOVASCULAR: No chest pain, orthopnea, edema.  GASTROINTESTINAL: No nausea, vomiting, diarrhea or abdominal pain.  GENITOURINARY: No dysuria, hematuria.  ENDOCRINE: No polyuria, nocturia,  HEMATOLOGY: No anemia, easy bruising or bleeding SKIN: No rash or lesion. MUSCULOSKELETAL: No joint pain or arthritis.   NEUROLOGIC: No tingling, numbness, weakness.  PSYCHIATRY: No anxiety or depression.   ROS  DRUG ALLERGIES:  No Known Allergies  VITALS:  Blood pressure (!) 160/64, pulse (!) 109, temperature 98.8 F (37.1 C), temperature source Oral, resp. rate 18, height 5\' 5"  (1.651 m), weight 127.4 kg (280 lb 13.9 oz), SpO2 96 %.  PHYSICAL EXAMINATION:  GENERAL:  77 y.o.-year-old patient lying in the bed with no acute distress.  EYES: Pupils equal, round, reactive to light and accommodation. No scleral icterus. Extraocular muscles intact.  HEENT: Head atraumatic, normocephalic. Oropharynx and nasopharynx clear.  NECK:  Supple, no jugular venous distention. No thyroid enlargement, no tenderness.  LUNGS: Normal breath sounds bilaterally, no wheezing, rales,rhonchi or crepitation. No use of accessory muscles of respiration.  CARDIOVASCULAR: S1, S2 normal. No murmurs, rubs, or gallops.  ABDOMEN: Soft, nontender, nondistended. Bowel sounds present. No organomegaly or mass.  EXTREMITIES: No pedal edema, cyanosis, or clubbing.  NEUROLOGIC: Cranial nerves II through  XII are intact. Muscle strength 5/5 in all extremities. Sensation intact. Gait not checked.  PSYCHIATRIC: The patient is alert and oriented x 3.  SKIN: No obvious rash, lesion, or ulcer.   Physical Exam LABORATORY PANEL:   CBC Recent Labs  Lab 08/27/2017 0414  WBC 16.6*  HGB 11.3*  HCT 34.3*  PLT 367   ------------------------------------------------------------------------------------------------------------------  Chemistries  Recent Labs  Lab 08/27/17 0420 08/07/2017 0418  NA 139  --   K 4.3 4.1  CL 111  --   CO2 19*  --   GLUCOSE 84  --   BUN 21  --   CREATININE 1.46* 1.59*  CALCIUM 7.7*  --   MG 1.7 1.8  AST 43*  --   ALT 22  --   ALKPHOS 61  --   BILITOT 0.6  --    ------------------------------------------------------------------------------------------------------------------  Cardiac Enzymes No results for input(s): TROPONINI in the last 168 hours. ------------------------------------------------------------------------------------------------------------------  RADIOLOGY:  [place] Ekg Site Rite  Result Date: 08/27/2017 If Site Rite image not attached, placement could not be confirmed due to current cardiac rhythm.   ASSESSMENT AND PLAN:  77 year old female patient with history of hypertension, hyperlipidemia, hypothyroidism referred from GI clinic for direct admission for abdominal discomfort.  *Acute carcinomatosis Etiology unknown Status post flexible sigmoidoscopy noted for internal hemorrhoids/diverticulosis August 23, 2017 Status post attempted diverting colostomy on August 25, 2017 which was aborted given severe carcinomatosis-sample of cancer taken and sent to lab for pathology review, for possible colonic stent placement  by gastroenterology/Dr. Alice Reichert later today, oncology planning for possible palliative chemotherapy -await further recommendations, palliative care input appreciated, continue TPN  *AcuteObstipation Stable Status post flexible  sigmoidoscopy noted for internal hemorrhoids/diverticulosis June 21,  2019as well as attempted diverting colostomy on August 25, 2017 Plan of care as stated above   *Acute Hypokalemia Repleted  *Acute hypomagnesemia Repleted with IV magnesium  *Chronic benign essential hypertension Stable Continue current regiment  * ChronicHyperlipidemia Stable Resume statinupon discharge  *Acute dehydration Resolved with IV fluids for rehydration   Condition stable Prognosis terminal-palliative care consulted Disposition pending clinical course  All the records are reviewed and case discussed with Care Management/Social Workerr. Management plans discussed with the patient, family and they are in agreement.  CODE STATUS: full  TOTAL TIME TAKING CARE OF THIS PATIENT: 35 minutes.     POSSIBLE D/C IN 1-5 DAYS, DEPENDING ON CLINICAL CONDITION.   Avel Peace Salary M.D on 08/20/2017   Between 7am to 6pm - Pager - [phone]  After 6pm go to [email or web address] - password [email or web address] [facility]  Office  [phone]  CC: Primary care physician; [name], MD  Note: This dictation was prepared with Dragon dictation along with smaller phrase technology. Any transcriptional errors that result from this process are unintentional.

## 2017-08-28 NOTE — Transfer of Care (Signed)
Immediate Anesthesia Transfer of Care Note  Patient: Charlott Holler  Procedure(s) Performed: FLEXIBLE SIGMOIDOSCOPY (N/A ) COLONIC STENT PLACEMENT (N/A )  Patient Location: PACU and ICU  Anesthesia Type:General  Level of Consciousness: Patient remains intubated per anesthesia plan  Airway & Oxygen Therapy: Patient remains intubated per anesthesia plan and Patient placed on Ventilator (see vital sign flow sheet for setting)  Post-op Assessment: Report given to RN and Post -op Vital signs reviewed and stable  Post vital signs: Reviewed and stable  Last Vitals:  Vitals Value Taken Time  BP 161/58 1730  Temp 98.83F 1730  Pulse 105 1730  Resp  see vent settings   SpO2 99% 1730    Last Pain:  Vitals:   08/10/2017 1327  TempSrc: Tympanic  PainSc:       Patients Stated Pain Goal: 2 (72/18/28 8337)  Complications: No apparent anesthesia complications

## 2017-08-28 NOTE — Anesthesia Procedure Notes (Signed)
Date/Time: 08/15/2017 2:52 PM Performed by: Philbert Riser, CRNA Pre-anesthesia Checklist: Patient identified, Emergency Drugs available, Suction available, Patient being monitored and Timeout performed Patient Re-evaluated:Patient Re-evaluated prior to induction Oxygen Delivery Method: Circle system utilized and Simple face mask Preoxygenation: Pre-oxygenation with 100% oxygen Induction Type: IV induction Laryngoscope Size: Glidescope and 3 Grade View: Grade I Tube type: Oral Tube size: 7.0 mm Number of attempts: 1 Airway Equipment and Method: Stylet Placement Confirmation: ETT inserted through vocal cords under direct vision,  positive ETCO2 and breath sounds checked- equal and bilateral Secured at: 21 cm Tube secured with: Tape Dental Injury: Teeth and Oropharynx as per pre-operative assessment

## 2017-08-28 NOTE — Progress Notes (Signed)
Underwent colonoscopy today for stent placement, procedure complicated by aspiration pneumonia, acute respiratory failure with hypoxia, advised to keep patient intubated, patient now to be transferred to ICU for further management, intensivist notified

## 2017-08-28 NOTE — Progress Notes (Signed)
Daily Progress Note   Patient Name: Alicia Conley       Date: 08/26/2017 DOB: [birth year]  Age: 77 y.o. MRN#: [id number] Attending Physician: Gorden Harms, MD Primary Care Physician: [name], MD Admit Date: 08/17/2017  Reason for Consultation/Follow-up: Establishing goals of care  Subjective: Patient is awake. Alert and oriented x3. Denies pain. States she does have a little discomfort from the RUA PICC line. No bruising noted, dressing clean, dry, intact. Husband at bedside and granddaughter later came in. Patient states she is going to have gastrostomy stent placed this morning after taking enema according to surgeons visit this morning. She continues to be hopeful for some relief with this. Family states they are also hopeful that Dr. Mike Gip will come by with biopsy results and can discuss were they go from here. Patient continues to remain positive and states "one day at a time that's all I can do." Briefly discussed goals of care discussion yesterday, however, they preferred not to discuss and again stated they are waiting for more answers and aren't ready to discuss anything but how to move forward. Offered support and understanding. Discussed family memories with patient and her family and focused on continuing to build a positive rapport. Patient and family appreciative for follow-up and patient asked me to please come back and check on her later this afternoon after her procedure.   Chart Reviewed.   Length of Stay: 7  Current Medications: Scheduled Meds:  . aspirin EC  81 mg Oral Daily  . atorvastatin  80 mg Oral Daily  . enoxaparin (LOVENOX) injection  40 mg Subcutaneous Q24H  . feeding supplement  1 Container Oral TID WC  . insulin aspart  0-9 Units Subcutaneous  Q4H  . levothyroxine  75 mcg Oral QAC breakfast  . metoprolol tartrate  100 mg Oral BID  . sertraline  100 mg Oral Daily  . sodium chloride flush  10-40 mL Intracatheter Q12H    Continuous Infusions: . Marland KitchenTPN (CLINIMIX-E) Adult 41 mL/hr at 08/27/17 1834  . sodium chloride 35 mL/hr at 08/27/17 1833  . famotidine (PEPCID) IV Stopped (08/27/17 2118)    PRN Meds: acetaminophen **OR** acetaminophen, alum & mag hydroxide-simeth, bisacodyl, fluticasone, hydrALAZINE, HYDROmorphone (DILAUDID) injection, morphine injection, ondansetron **OR** ondansetron (ZOFRAN) IV, sodium chloride flush  Physical Exam  Constitutional: She is oriented to person, place, and time. Vital signs are normal. She appears well-developed. She is cooperative. She has a sickly appearance.  Cardiovascular: Normal rate, regular rhythm, normal heart sounds, intact distal pulses and normal pulses.  Pulmonary/Chest: Effort normal and breath sounds normal.  Abdominal: Normal appearance. Bowel sounds are decreased.  Neurological: She is alert and oriented to person, place, and time.  Skin: Right upper arm PICC line in place.  Psychiatric: She has a normal mood and affect. Her speech is normal and behavior is normal. Judgment and thought content normal. Cognition and memory are normal.  Nursing note and vitals reviewed.  Vital Signs: BP (!) 161/57 (BP Location: Left Arm)   Pulse (!) 110   Temp 98.2 F (36.8 C) (Oral)   Resp 20   Ht 5\' 5"  (1.651 m)   Wt 127.4 kg (280 lb 13.9 oz)   SpO2 93%   BMI 46.74 kg/m  SpO2: SpO2: 93 % O2 Device: O2 Device: Nasal Cannula O2 Flow Rate: O2 Flow Rate (L/min): 2 L/min  Intake/output summary:   Intake/Output Summary (Last 24 hours) at 08/14/2017 1111 Last data filed at 08/16/2017 0847 Gross per 24 hour  Intake 690 ml  Output 190 ml  Net 500 ml   LBM: Last BM Date: 08/10/2017 Baseline Weight: Weight: 126.9 kg (279 lb 12.2 oz) Most recent weight: Weight: 127.4 kg (280 lb 13.9  oz)       Palliative Assessment/Data: PPS 50% NPO    Patient Active Problem List   Diagnosis Date Noted  . Large bowel obstruction ([place])   . Abdominal carcinomatosis ([place])   . Abdominal pain 08/20/2017  . Ileus ([place]) 08/11/2017    Palliative Care Assessment & Plan   Patient Profile: 77 y.o. female admitted on 08/08/2017 from home with abdominal pain for the last 2.5 weeks. She has a past medical history of hyperlipidemia, hypertension, and hypothyroidism. She had been worked up with [place] and CT abdomen revealed a partial distal colonic obstruction with a large amount of stool. She was seen at our ER on Saturday and was discharged after evaluation. She continued to have discomfort and unable to eat or drink fluids, which led her to see Dr. Alice Reichert (Gastroenterologist). He directly admitted her. She reported her last bowel movement was more than a week prior to admission. Since admission patient has received fluid and electrolyte replacement. She is s/p flexible sigmoidoscopy on 08/31/2017 noted for internal hemorrhoids and diverticulosis. An attempt was made for a diverting colostomy on 08/05/2017 which was aborted given severe carcinomatosis; biopsy was taken. She has been seen by Dr. Mike Gip, Oncologist to evaluate plan of care. Palliative Medicine team consulted for goals of care discussion.   Recommendations/Plan:  FULL CODE -at patient/family's request. Despite knowing the extent of her current condition. Patient would like all aggressive measures.   Continue to treat the treatable. Patient and family is relying on improvement once she is able to have stent placement, biopsy results, and have detailed conversation with Oncology regarding plan of care.   Patient/family would not like to discuss or set up outpatient palliative vs. Hospice at this time. Will continue to have crucial conversations once they have more information on her condition in regards to Oncology.   Would recommend  venting gastrostomy tube at some point: as this will be beneficial in future to help with symptoms and medication. This is known to be of benefit with patients with known peritoneal carcinomatosis and management of the potential  for high risk malignant bowel obstructions.   If patient develops uncontrolled nausea would recommend scheduled reglan, zofran and compazine for support.   Also would recommend the use of octreotide, decadron, and high dose PPI or H2 antagonist if needed.   Palliative Medicine team will continue to support patient, family, and medical team during hospitalization.   Goals of Care and Additional Recommendations:  Limitations on Scope of Treatment: Full Scope Treatment  Code Status:    Code Status Orders  (From admission, onward)        Start     Ordered   08/04/2017 1732  Full code  Continuous     08/15/2017 1731    Code Status History    This patient has a current code status but no historical code status.       Prognosis:   Unable to determine-guarded to poor in the setting of poor po intake, TPN initiation, decreased mobility, advanced peritoneal carcinomatosis, partial bowel obstruction, unsuccessful attempt for diverting colostomy, obstipation, hypertension, and hyperlipidemia.   Discharge Planning: To Be Determined - would recommend at minimum outpatient Palliative if patient continues with aggressive measures (chemo/xrt).   Care plan was discussed with patient, family, and bedside RN.   Thank you for allowing the Palliative Medicine Team to assist in the care of this patient.   Total Time 45 min.  Prolonged Time Billed  NO       Greater than 50%  of this time was spent counseling and coordinating care related to the above assessment and plan.  Alda Lea, NP-BC Palliative Medicine Team  Phone: [phone] Fax: [phone] Pager: [phone] Amion: Bjorn Pippin   Please contact Palliative Medicine Team phone at [phone] for  questions and concerns.

## 2017-08-29 ENCOUNTER — Other Ambulatory Visit: Payer: Self-pay | Admitting: Pathology

## 2017-08-29 ENCOUNTER — Inpatient Hospital Stay: Payer: Medicare HMO

## 2017-08-29 LAB — BLOOD GAS, ARTERIAL
Acid-base deficit: 5.5 mmol/L — ABNORMAL HIGH (ref 0.0–2.0)
BICARBONATE: 21.1 mmol/L (ref 20.0–28.0)
FIO2: 40
O2 Saturation: 95.9 %
PEEP: 3 cmH2O
Patient temperature: 37
Pressure support: 5 cmH2O
pCO2 arterial: 45 mmHg (ref 32.0–48.0)
pH, Arterial: 7.28 — ABNORMAL LOW (ref 7.350–7.450)
pO2, Arterial: 91 mmHg (ref 83.0–108.0)

## 2017-08-29 LAB — MAGNESIUM: MAGNESIUM: 2 mg/dL (ref 1.7–2.4)

## 2017-08-29 LAB — CBC
HCT: 30.3 % — ABNORMAL LOW (ref 35.0–47.0)
HEMOGLOBIN: 10 g/dL — AB (ref 12.0–16.0)
MCH: 28.8 pg (ref 26.0–34.0)
MCHC: 32.9 g/dL (ref 32.0–36.0)
MCV: 87.6 fL (ref 80.0–100.0)
Platelets: 346 10*3/uL (ref 150–440)
RBC: 3.46 MIL/uL — AB (ref 3.80–5.20)
RDW: 15.8 % — ABNORMAL HIGH (ref 11.5–14.5)
WBC: 17.4 10*3/uL — ABNORMAL HIGH (ref 3.6–11.0)

## 2017-08-29 LAB — COMPREHENSIVE METABOLIC PANEL
ALK PHOS: 65 U/L (ref 38–126)
ALT: 15 U/L (ref 0–44)
ANION GAP: 5 (ref 5–15)
AST: 29 U/L (ref 15–41)
Albumin: 2 g/dL — ABNORMAL LOW (ref 3.5–5.0)
BUN: 32 mg/dL — ABNORMAL HIGH (ref 8–23)
CALCIUM: 7.8 mg/dL — AB (ref 8.9–10.3)
CO2: 20 mmol/L — AB (ref 22–32)
Chloride: 113 mmol/L — ABNORMAL HIGH (ref 98–111)
Creatinine, Ser: 1.15 mg/dL — ABNORMAL HIGH (ref 0.44–1.00)
GFR calc non Af Amer: 45 mL/min — ABNORMAL LOW (ref >60)
GFR, EST AFRICAN AMERICAN: 52 mL/min — AB (ref >60)
Glucose, Bld: 155 mg/dL — ABNORMAL HIGH (ref 70–99)
Potassium: 4.2 mmol/L (ref 3.5–5.1)
Sodium: 138 mmol/L (ref 135–145)
TOTAL PROTEIN: 5.3 g/dL — AB (ref 6.5–8.1)
Total Bilirubin: 0.4 mg/dL (ref 0.3–1.2)

## 2017-08-29 LAB — PHOSPHORUS: PHOSPHORUS: 2 mg/dL — AB (ref 2.5–4.6)

## 2017-08-29 LAB — GLUCOSE, CAPILLARY
GLUCOSE-CAPILLARY: 130 mg/dL — AB (ref 70–99)
GLUCOSE-CAPILLARY: 211 mg/dL — AB (ref 70–99)
Glucose-Capillary: 134 mg/dL — ABNORMAL HIGH (ref 70–99)
Glucose-Capillary: 116 mg/dL — ABNORMAL HIGH (ref 70–99)
Glucose-Capillary: 111 mg/dL — ABNORMAL HIGH (ref 70–99)

## 2017-08-29 MED ORDER — ALBUTEROL SULFATE (2.5 MG/3ML) 0.083% IN NEBU
2.5000 mg | INHALATION_SOLUTION | RESPIRATORY_TRACT | Status: DC | PRN
  Administered 2017-08-29 – 2017-09-04 (×3): 2.5 mg via RESPIRATORY_TRACT
  Filled 2017-08-29 (×3): qty 3

## 2017-08-29 MED ORDER — CHLORHEXIDINE GLUCONATE 0.12% ORAL RINSE (MEDLINE KIT)
15.0000 mL | Freq: Two times a day (BID) | OROMUCOSAL | Status: DC
  Administered 2017-08-29 – 2017-09-06 (×12): 15 mL via OROMUCOSAL

## 2017-08-29 MED ORDER — FAMOTIDINE IN NACL 20-0.9 MG/50ML-% IV SOLN
20.0000 mg | INTRAVENOUS | Status: DC
  Administered 2017-08-29 – 2017-08-30 (×2): 20 mg via INTRAVENOUS
  Filled 2017-08-29 (×2): qty 50

## 2017-08-29 MED ORDER — ORAL CARE MOUTH RINSE
15.0000 mL | OROMUCOSAL | Status: DC
  Administered 2017-08-29 (×6): 15 mL via OROMUCOSAL

## 2017-08-29 MED ORDER — ENOXAPARIN SODIUM 40 MG/0.4ML ~~LOC~~ SOLN
40.0000 mg | SUBCUTANEOUS | Status: DC
  Administered 2017-08-29 – 2017-09-04 (×7): 40 mg via SUBCUTANEOUS
  Filled 2017-08-29 (×7): qty 0.4

## 2017-08-29 MED ORDER — BOOST / RESOURCE BREEZE PO LIQD CUSTOM
1.0000 | Freq: Three times a day (TID) | ORAL | Status: DC
  Administered 2017-08-30 – 2017-09-04 (×12): 1 via ORAL

## 2017-08-29 MED ORDER — TRACE MINERALS CR-CU-MN-SE-ZN 10-1000-500-60 MCG/ML IV SOLN
INTRAVENOUS | Status: DC
  Administered 2017-08-29: 21:00:00 via INTRAVENOUS
  Filled 2017-08-29: qty 1992

## 2017-08-29 MED ORDER — SODIUM PHOSPHATES 45 MMOLE/15ML IV SOLN
20.0000 mmol | Freq: Once | INTRAVENOUS | Status: AC
  Administered 2017-08-29: 20 mmol via INTRAVENOUS
  Filled 2017-08-29 (×2): qty 6.67

## 2017-08-29 NOTE — Progress Notes (Signed)
[facility] [place] NAME: Alicia Conley    MR#:  [id number]  DATE OF BIRTH:  1941/02/03  SUBJECTIVE:  Patient without complaint, no events overnight per nursing staff Extubated Family in the room  REVIEW OF SYSTEMS:  Review of Systems  Constitutional: Negative for chills, fever and weight loss.  HENT: Negative for ear discharge, ear pain and nosebleeds.   Eyes: Negative for blurred vision, pain and discharge.  Respiratory: Negative for sputum production, shortness of breath, wheezing and stridor.   Cardiovascular: Negative for chest pain, palpitations, orthopnea and PND.  Gastrointestinal: Negative for abdominal pain, diarrhea, nausea and vomiting.  Genitourinary: Negative for frequency and urgency.  Musculoskeletal: Negative for back pain and joint pain.  Neurological: Positive for weakness. Negative for sensory change, speech change and focal weakness.  Psychiatric/Behavioral: Negative for depression and hallucinations. The patient is not nervous/anxious.     DRUG ALLERGIES:  No Known Allergies  VITALS:  Blood pressure (!) 152/55, pulse 94, temperature 98.8 F (37.1 C), temperature source Oral, resp. rate 19, height 5\' 5"  (1.651 m), weight 130.9 kg (288 lb 9.3 oz), SpO2 95 %.  PHYSICAL EXAMINATION:  GENERAL:  77 y.o.-year-old patient lying in the bed with no acute distress. obese EYES: Pupils equal, round, reactive to light and accommodation. No scleral icterus. Extraocular muscles intact.  HEENT: Head atraumatic, normocephalic. Oropharynx and nasopharynx clear.  NECK:  Supple, no jugular venous distention. No thyroid enlargement, no tenderness.  LUNGS: Normal breath sounds bilaterally, no wheezing, rales,rhonchi or crepitation. No use of accessory muscles of respiration.  CARDIOVASCULAR: S1, S2 normal. No murmurs, rubs, or gallops.  ABDOMEN: Soft, nontender, nondistended. Bowel sounds present. No organomegaly or mass.   EXTREMITIES: ++ pedal edema,no cyanosis, or clubbing.  NEUROLOGIC: Cranial nerves II through XII are intact. Muscle strength 5/5 in all extremities. Sensation intact. Gait not checked.  PSYCHIATRIC: The patient is alert and oriented x 3.  SKIN: No obvious rash, lesion, or ulcer.   Physical Exam LABORATORY PANEL:   CBC Recent Labs  Lab 08/29/17 0516  WBC 17.4*  HGB 10.0*  HCT 30.3*  PLT 346   ------------------------------------------------------------------------------------------------------------------  Chemistries  Recent Labs  Lab 08/29/17 0516  NA 138  K 4.2  CL 113*  CO2 20*  GLUCOSE 155*  BUN 32*  CREATININE 1.15*  CALCIUM 7.8*  MG 2.0  AST 29  ALT 15  ALKPHOS 65  BILITOT 0.4   ------------------------------------------------------------------------------------------------------------------  Cardiac Enzymes No results for input(s): TROPONINI in the last 168 hours. ------------------------------------------------------------------------------------------------------------------  RADIOLOGY:  Dg Abd 1 View  Result Date: 08/24/2017 CLINICAL DATA:  Initial evaluation for enteric tube placement. EXAM: ABDOMEN - 1 VIEW COMPARISON:  None. FINDINGS: Enteric tube in place with tip in side hole seen overlying the stomach, well beyond the GE junction. Tip projects distally. Visualized bowel gas pattern within normal limits. IMPRESSION: Enteric tube in place with tip and side hole overlying the stomach, well beyond the GE junction. Electronically Signed   By: Jeannine Boga M.D.   On: 08/14/2017 20:20   Dg Abd Portable 2v  Result Date: 08/29/2017 CLINICAL DATA:  Peritoneal carcinomatosis.  Colonic obstruction. EXAM: PORTABLE ABDOMEN - 2 VIEW COMPARISON:  Acute abdominal series 08/26/2017 and KUB 08/15/2017 FINDINGS: Examination demonstrates collection of air under the left hemidiaphragm on the upright film some which is intraluminal although it would be difficult  to exclude free peritoneal air. Bowel gas pattern is otherwise nonobstructive. There are 2 catheters over the  left lower quadrant/pelvis. Skin staples over the soft tissues of the lateral left pelvis/lower abdomen. Remainder of the exam is unchanged. IMPRESSION: Nonobstructive bowel gas pattern. Collection of air below the left hemidiaphragm on the upright film likely intraluminal although cannot exclude a component of free peritoneal air. Recommend clinical correlation and consider left-side-down decubitus films to exclude free peritoneal air. Two catheters over the left lower quadrant/left pelvis. These results were called by telephone at the time of interpretation on 08/29/2017 at 5:28 pm to patient's nurse Council Mechanic, who verbally acknowledged these results and will notify patient's physician. Electronically Signed   By: Marin Olp M.D.   On: 08/29/2017 17:28   Dg C-arm 1-60 Min-no Report  Result Date: 08/14/2017 Fluoroscopy was utilized by the requesting physician.  No radiographic interpretation.    ASSESSMENT AND PLAN:  77 year old female patient with history of hypertension, hyperlipidemia, hypothyroidism referred from GI clinic for direct admission for abdominal discomfort.  *Acute carcinomatosis Etiology unknown--?ovarian mass -Status post flexible sigmoidoscopy noted for internal hemorrhoids/diverticulosis August 23, 2017 -Status post attempted diverting colostomy on August 25, 2017 which was aborted given severe carcinomatosis-sample of cancer taken and sent to lab for pathology review -s/p colonic stent placement  by gastroenterology/Dr. Alice Reichert -pt intubated since aspirated prior to cprocedure. Now extubated and on 2 liter [place]  *AcuteObstipation Status post flexible sigmoidoscopy noted for internal hemorrhoids/diverticulosis June 21, 2019as well as attempted diverting colostomy on August 25, 2017 Plan of care as stated above   *Acute Hypokalemia Repleted  *Acute  hypomagnesemia Repleted with IV magnesium  *Chronic benign essential hypertension Continue current regiment  * ChronicHyperlipidemia Resume statinupon discharge  Condition stable Prognosis poor -palliative care consulted Disposition pending clinical course  All the records are reviewed and case discussed with Care Management/Social Workerr. Management plans discussed with the patient, family and they are in agreement.  CODE STATUS: full  TOTAL TIME TAKING CARE OF THIS PATIENT: 25 minutes.     POSSIBLE D/C IN 1-5 DAYS, DEPENDING ON CLINICAL CONDITION.   Fritzi Mandes M.D on 08/29/2017   Between 7am to 6pm - Pager - [phone]  After 6pm go to [email or web address] - password [email or web address] [facility]  Office  [phone]  CC: Primary care physician; [name], MD  Note: This dictation was prepared with Dragon dictation along with smaller phrase technology. Any transcriptional errors that result from this process are unintentional.

## 2017-08-29 NOTE — Progress Notes (Signed)
[facility] Gastroenterology Inpatient Progress Note  Subjective: Patient seen for f/u respiratory failure during preprocedure MAC anesthesia yesterday. Patient had a flexible sigmoidoscopy with colonic stent yesterday after the patient was intubated and placed under general anesthesia. Two stents were placed with apparent patency achieved. Patient just extubated this AM and appears to be breathing well. Would like ice chips.  Objective: Vital signs in last 24 hours: Temp:  [97.8 F (36.6 C)-99.4 F (37.4 C)] 98.8 F (37.1 C) (06/27 0500) Pulse Rate:  [66-110] 94 (06/27 1200) Resp:  [12-24] 19 (06/27 1200) BP: (102-192)/(35-108) 152/55 (06/27 1100) SpO2:  [95 %-100 %] 95 % (06/27 1200) FiO2 (%):  [40 %-50 %] 40 % (06/27 0802) Weight:  [130.9 kg (288 lb 9.3 oz)] 130.9 kg (288 lb 9.3 oz) (06/27 0500) Blood pressure (!) 152/55, pulse 94, temperature 98.8 F (37.1 C), temperature source Oral, resp. rate 19, height _0  (1.651 m), weight 130.9 kg (288 lb 9.3 oz), SpO2 95 %.    Intake/Output from previous day: 06/26 0701 - 06/27 0700 In: 1311.5 [I.V.:1221.5] Out: 907 [Urine:795; Drains:112]  Intake/Output this shift: No intake/output data recorded.   General appearance:  Alert, conversive, NAD.Resp:  Few rhonchi. Cardio: RR ns S1, S2 GI:  Abdomen distended.  Still in some post laparotomy discomfort. BS hypoactive but present.  Extremities:  Trace edema.   Lab Results: Results for orders placed or performed during the hospital encounter of 08/10/2017 (from the past 24 hour(s))  Glucose, capillary     Status: Abnormal   Collection Time: 08/12/2017  5:27 PM  Result Value Ref Range   Glucose-Capillary 130 (H) 70 - 99 mg/dL  Blood gas, arterial     Status: Abnormal   Collection Time: 08/03/2017  6:03 PM  Result Value Ref Range   FIO2 0.50    Delivery systems VENTILATOR    Mode PRESSURE REGULATED VOLUME CONTROL    VT 500 mL   LHR 14 resp/min   Peep/cpap 5.0 cm H20   pH,  Arterial 7.15 (LL) 7.350 - 7.450   pCO2 arterial 58 (H) 32.0 - 48.0 mmHg   pO2, Arterial 95 83.0 - 108.0 mmHg   Bicarbonate 20.2 20.0 - 28.0 mmol/L   Acid-base deficit 8.9 (H) 0.0 - 2.0 mmol/L   O2 Saturation 94.8 %   Patient temperature 37.0    Collection site LEFT RADIAL    Sample type ARTERIAL DRAW    Allens test (pass/fail) PASS PASS  Glucose, capillary     Status: Abnormal   Collection Time: 08/03/2017  8:49 PM  Result Value Ref Range   Glucose-Capillary 186 (H) 70 - 99 mg/dL  Glucose, capillary     Status: Abnormal   Collection Time: 08/29/17 12:08 AM  Result Value Ref Range   Glucose-Capillary 211 (H) 70 - 99 mg/dL  Glucose, capillary     Status: Abnormal   Collection Time: 08/29/17  5:03 AM  Result Value Ref Range   Glucose-Capillary 134 (H) 70 - 99 mg/dL  CBC     Status: Abnormal   Collection Time: 08/29/17  5:16 AM  Result Value Ref Range   WBC 17.4 (H) 3.6 - 11.0 K/uL   RBC 3.46 (L) 3.80 - 5.20 MIL/uL   Hemoglobin 10.0 (L) 12.0 - 16.0 g/dL   HCT 30.3 (L) 35.0 - 47.0 %   MCV 87.6 80.0 - 100.0 fL   MCH 28.8 26.0 - 34.0 pg   MCHC 32.9 32.0 - 36.0 g/dL   RDW 15.8 (H)  11.5 - 14.5 %   Platelets 346 150 - 440 K/uL  Comprehensive metabolic panel     Status: Abnormal   Collection Time: 08/29/17  5:16 AM  Result Value Ref Range   Sodium 138 135 - 145 mmol/L   Potassium 4.2 3.5 - 5.1 mmol/L   Chloride 113 (H) 98 - 111 mmol/L   CO2 20 (L) 22 - 32 mmol/L   Glucose, Bld 155 (H) 70 - 99 mg/dL   BUN 32 (H) 8 - 23 mg/dL   Creatinine, Ser 1.15 (H) 0.44 - 1.00 mg/dL   Calcium 7.8 (L) 8.9 - 10.3 mg/dL   Total Protein 5.3 (L) 6.5 - 8.1 g/dL   Albumin 2.0 (L) 3.5 - 5.0 g/dL   AST 29 15 - 41 U/L   ALT 15 0 - 44 U/L   Alkaline Phosphatase 65 38 - 126 U/L   Total Bilirubin 0.4 0.3 - 1.2 mg/dL   GFR calc non Af Amer 45 (L) >60 mL/min   GFR calc Af Amer 52 (L) >60 mL/min   Anion gap 5 5 - 15  Magnesium     Status: None   Collection Time: 08/29/17  5:16 AM  Result Value Ref Range    Magnesium 2.0 1.7 - 2.4 mg/dL  Phosphorus     Status: Abnormal   Collection Time: 08/29/17  5:16 AM  Result Value Ref Range   Phosphorus 2.0 (L) 2.5 - 4.6 mg/dL  Blood gas, arterial     Status: Abnormal   Collection Time: 08/29/17 11:30 AM  Result Value Ref Range   FIO2 40.00    Delivery systems VENTILATOR    Mode PRESSURE SUPPORT    Peep/cpap 3.0 cm H20   Pressure support 5 cm H20   pH, Arterial 7.28 (L) 7.350 - 7.450   pCO2 arterial 45 32.0 - 48.0 mmHg   pO2, Arterial 91 83.0 - 108.0 mmHg   Bicarbonate 21.1 20.0 - 28.0 mmol/L   Acid-base deficit 5.5 (H) 0.0 - 2.0 mmol/L   O2 Saturation 95.9 %   Patient temperature 37.0    Collection site LEFT BRACHIAL    Sample type ARTERIAL DRAW    Allens test (pass/fail) PASS PASS     Recent Labs    08/27/17 0420 08/27/2017 0414 08/29/17 0516  WBC 14.8* 16.6* 17.4*  HGB 11.3* 11.3* 10.0*  HCT 34.4* 34.3* 30.3*  PLT 329 367 346   BMET Recent Labs    08/27/17 0420 08/04/2017 0418 08/29/17 0516  NA 139  --  138  K 4.3 4.1 4.2  CL 111  --  113*  CO2 19*  --  20*  GLUCOSE 84  --  155*  BUN 21  --  32*  CREATININE 1.46* 1.59* 1.15*  CALCIUM 7.7*  --  7.8*   LFT Recent Labs    08/29/17 0516  PROT 5.3*  ALBUMIN 2.0*  AST 29  ALT 15  ALKPHOS 65  BILITOT 0.4   PT/INR No results for input(s): LABPROT, INR in the last 72 hours. Hepatitis Panel No results for input(s): HEPBSAG, HCVAB, HEPAIGM, HEPBIGM in the last 72 hours. C-Diff No results for input(s): CDIFFTOX in the last 72 hours. No results for input(s): CDIFFPCR in the last 72 hours.   Studies/Results: Dg Abd 1 View  Result Date: 08/03/2017 CLINICAL DATA:  Initial evaluation for enteric tube placement. EXAM: ABDOMEN - 1 VIEW COMPARISON:  None. FINDINGS: Enteric tube in place with tip in side hole seen overlying the stomach,  well beyond the GE junction. Tip projects distally. Visualized bowel gas pattern within normal limits. IMPRESSION: Enteric tube in place with  tip and side hole overlying the stomach, well beyond the GE junction. Electronically Signed   By: Jeannine Boga M.D.   On: 08/17/2017 20:20   Dg C-arm 1-60 Min-no Report  Result Date: 08/04/2017 Fluoroscopy was utilized by the requesting physician.  No radiographic interpretation.    Scheduled Inpatient Medications:   . aspirin EC  81 mg Oral Daily  . atorvastatin  80 mg Oral Daily  . chlorhexidine gluconate (MEDLINE KIT)  15 mL Mouth Rinse BID  . enoxaparin (LOVENOX) injection  40 mg Subcutaneous Q24H  . insulin aspart  0-9 Units Subcutaneous Q4H  . levothyroxine  75 mcg Oral QAC breakfast  . mouth rinse  15 mL Mouth Rinse 10 times per day  . metoprolol tartrate  100 mg Oral BID  . sertraline  100 mg Oral Daily  . sodium chloride flush  10-40 mL Intracatheter Q12H    Continuous Inpatient Infusions:   . Marland KitchenTPN (CLINIMIX-E) Adult 83 mL/hr at 08/29/17 0600  . Marland KitchenTPN (CLINIMIX-E) Adult    . famotidine (PEPCID) IV Stopped (08/24/2017 2125)  . fentaNYL infusion INTRAVENOUS Stopped (08/29/17 1015)  . sodium phosphate  Dextrose 5% IVPB 20 mmol (08/29/17 1112)    PRN Inpatient Medications:  acetaminophen **OR** acetaminophen, alum & mag hydroxide-simeth, bisacodyl, fluticasone, hydrALAZINE, HYDROmorphone (DILAUDID) injection, morphine injection, ondansetron **OR** ondansetron (ZOFRAN) IV, sodium chloride flush  Miscellaneous: N/A  Assessment:  1. Peritoneal carcinomatosis with colonic obstruction - s/p Colonic stent placement on 08/26/2017.  2. Respiratory failure with MAC anesthesia pre-sigmoidoscopy.- Required preprocedural General anesthesia to support airway prior to colonic stent placement. S/p extubation. Appears well today in that regard.   3. Morbid obesity.  4. Spinal stenosis.  5. Vitamin B12 deficiency.  Plan:  1. Advance to clears today. 2. Check 2V port KUB XR today. 3. Monitor progress. 4. Dr. Allen Norris  will be covering my service tomorrow, Friday 08/30/17, and [facility]  (Dr. Bonna Gains) will cover through the weekend.  Thank you for allowing me to help take care of this pleasant patient.  Heaton Sarin K. Alice Reichert, M.D. 08/29/2017, 2:02 PM

## 2017-08-29 NOTE — Progress Notes (Signed)
Abdominal xray revealed collection of air below the left hemidiaphragm likely intraluminal, however unable to exclude a component of free peritoneal air.  According to day shift nurse [facility] radiologist interpreting xray recommended CT Abd Pelvis.  I spoke with Gastroenterologist Dr. Alice Reichert via telephone informing him of abdominal xray results he stated he would only recommend CT Abd Pelvis if pt c/o abdominal pain.  I spoke with the pt she endorses abdominal pain and with light palpation pain is severe.  Therefore, will order CT Abd Pelvis.  Marda Stalker, [place] Pager [phone] (please enter 7 digits) PCCM Consult Pager [phone] (please enter 7 digits)

## 2017-08-29 NOTE — Progress Notes (Signed)
[place] Hospital Day(s): 8.   Post op day(s): 1 Day Post-Op.   Interval History: Patient seen and examined, experienced aspiration pneumonitis with hypoxia, together requiring intubation prior to placement of palliative colonic stent by Dr. Alice Reichert yesterday. Patient currently remains intubated, though following commands with extubation anticipated per ICU team. Patient able to report mild LLQ peri-drain > peri-incisional pain, denies N/V, fever/chills, or CP.  Review of Systems: Limited to HPI due to intubation with limited ability to communicate accordingly  Vital signs in last 24 hours: [min-max] current  Temp:  [97.8 F (36.6 C)-99.4 F (37.4 C)] 98.8 F (37.1 C) (06/27 0500) Pulse Rate:  [66-110] 89 (06/27 1100) Resp:  [12-22] 18 (06/27 1100) BP: (102-192)/(35-108) 152/55 (06/27 1100) SpO2:  [96 %-100 %] 97 % (06/27 1100) FiO2 (%):  [40 %-50 %] 40 % (06/27 0802) Weight:  [288 lb 9.3 oz (130.9 kg)] 288 lb 9.3 oz (130.9 kg) (06/27 0500)     Height: 5\' 5"  (165.1 cm) Weight: 288 lb 9.3 oz (130.9 kg) BMI (Calculated): 48.02   Intake/Output this shift:  No intake/output data recorded.   Intake/Output last 2 shifts:  @IOLAST2SHIFTS @   Physical Exam:  Constitutional: alert, cooperative and no distress  Respiratory: intubated, currently CPAP trial, no respiratory distress  Cardiovascular: regular rate and sinus rhythm on monitor Gastrointestinal: soft andmorbidly obese with minimal LLQ peri-incisional and peri-drain tenderness to palpation with wound VAC intact without surrounding erythema or drainage  Labs:  CBC Latest Ref Rng & Units 08/29/2017 08/26/2017 08/27/2017  WBC 3.6 - 11.0 K/uL 17.4(H) 16.6(H) 14.8(H)  Hemoglobin 12.0 - 16.0 g/dL 10.0(L) 11.3(L) 11.3(L)  Hematocrit 35.0 - 47.0 % 30.3(L) 34.3(L) 34.4(L)  Platelets 150 - 440 K/uL 346 367 329   CMP Latest Ref Rng & Units 08/29/2017 08/11/2017 08/27/2017  Glucose 70 - 99 mg/dL 155(H) - 84  BUN 8 - 23 mg/dL  32(H) - 21  Creatinine 0.44 - 1.00 mg/dL 1.15(H) 1.59(H) 1.46(H)  Sodium 135 - 145 mmol/L 138 - 139  Potassium 3.5 - 5.1 mmol/L 4.2 4.1 4.3  Chloride 98 - 111 mmol/L 113(H) - 111  CO2 22 - 32 mmol/L 20(L) - 19(L)  Calcium 8.9 - 10.3 mg/dL 7.8(L) - 7.7(L)  Total Protein 6.5 - 8.1 g/dL 5.3(L) - 5.9(L)  Total Bilirubin 0.3 - 1.2 mg/dL 0.4 - 0.6  Alkaline Phos 38 - 126 U/L 65 - 61  AST 15 - 41 U/L 29 - 43(H)  ALT 0 - 44 U/L 15 - 22   Imaging studies: No new pertinent imaging studies   Assessment/Plan: 77 y.o.femalewith distal large bowel obstruction attributed to external compression by extensive diffuse peritoneal carcinomatosis4DaysPost-Ops/p laparotomy with unsuccessful attempted colostomy instead with decompressing colon-drainage tube and 1 Day Post-Procedure s/p colonoscopic placement of decompressing colonic stents x2, complicated bypre-stent aspiration pneumonia, decreasingpost-surgical leukocytosis, and bycomorbidities includingmorbid obesity (BMI 47), HTN, hypercholesterolemia, thyroid disease (not otherwise specified), and chronic back pain.  - follow up pending pathology - wean vent towards extubation - pain control prn (minimize narcotics) - monitor ongoing abdominal exam and bowel function  - clear liquids diet + clear liquids supplements when extubated - palliative decompressing distal colonic stent by Dr. Alice Reichert appreciated             - if ovarian etiology determined, recommend gyn oncology consultation - attempted to answer as many of patient's and her family's questions as possible - medical management of comorbidities - DVT prophylaxis, ambulation  All of the above findings  and recommendations were discussed with the patient, patient's family, and the medical team, and all of patient's and family's questions were answered to their expressed satisfaction.   Thank  you for the opportunity to participate in this patient's care.  -- Marilynne Drivers Rosana Hoes, MD, [place]: [facility] - Partnering for exceptional care. Office: [phone]

## 2017-08-29 NOTE — Progress Notes (Addendum)
Daily Progress Note   Patient Name: Alicia Conley       Date: 08/29/2017 DOB: 03-21-1940  Age: 77 y.o. MRN#: [id number] Attending Physician: Fritzi Mandes, MD Primary Care Physician: [name], MD Admit Date: 09/01/2017  Reason for Consultation/Follow-up: Establishing goals of care  Subjective: Patient is currently intubated, although she is awake, alert, and follows commands. When I entered the room she reached out her hand for me. Husband and granddaughter at bedside. She complains of some LLQ abdominal pain at incision site. Wound vac and dressing intact. Unfortunately patient was intubated yesterday prior to placement of colonic stent due to aspiration pneumonitis with hypoxia. The plan per ICU medical team is for extubation this morning. Per surgery she will be able to start on clear liquids also. Granddaughter stated family was told that all results of biopsy have not been finalized but at the moment based on current results, this may be ovarian cancer. Family is still hopefull that patient can undergo some form of treatment. They are not prepared at this time despite detailed conversation and seeing her in ICU intubated to make the decision for her to be a DNR. Family continues to state they are relying on God to pull her through and that she is a strong woman. Granddaughter states "look at her, I don't think the doctors felt she would do good after being on life support and she is awake and alert. There is no question that she is a strong fighting woman and God is bringing her through!" Family is appreciative of my support and conversations. However, they continue to not want to approach any advance directives, hospice/palliative decisions until they have all results from biopsy and have  input from Oncology. Family knows to contact me with any questions or concerns.   Chart Reviewed and report received from bedside RN.   Length of Stay: 8  Current Medications: Scheduled Meds:  . aspirin EC  81 mg Oral Daily  . atorvastatin  80 mg Oral Daily  . chlorhexidine gluconate (MEDLINE KIT)  15 mL Mouth Rinse BID  . enoxaparin (LOVENOX) injection  40 mg Subcutaneous Q24H  . insulin aspart  0-9 Units Subcutaneous Q4H  . levothyroxine  75 mcg Oral QAC breakfast  . mouth rinse  15 mL Mouth Rinse 10 times per day  .  metoprolol tartrate  100 mg Oral BID  . sertraline  100 mg Oral Daily  . sodium chloride flush  10-40 mL Intracatheter Q12H    Continuous Infusions: . Marland KitchenTPN (CLINIMIX-E) Adult 83 mL/hr at 08/29/17 0600  . Marland KitchenTPN (CLINIMIX-E) Adult    . famotidine (PEPCID) IV Stopped (08/20/2017 2125)  . fentaNYL infusion INTRAVENOUS Stopped (08/29/17 1015)  . sodium phosphate  Dextrose 5% IVPB 20 mmol (08/29/17 1112)    PRN Meds: acetaminophen **OR** acetaminophen, alum & mag hydroxide-simeth, bisacodyl, fluticasone, hydrALAZINE, HYDROmorphone (DILAUDID) injection, morphine injection, ondansetron **OR** ondansetron (ZOFRAN) IV, sodium chloride flush  Physical Exam    Constitutional: She is intubated. Awake and alert. Vital signs are normal. She appears well-developed. She is cooperative. She has a sickly appearance.  Cardiovascular: Normal rate, regular rhythm, normal heart sounds, intact distal pulses and normal pulses.  Pulmonary/Chest: Effort normal and breath sounds rhonchi + Intubated.  Abdominal: Normal appearance. Bowel sounds are decreased. Abdominal dressing s/p palliative colonic stent with wound vac.  Neurological: She is alert and oriented to person, place, and time.  Skin: Right upper arm PICC line in place.  Psychiatric: She has a normal mood and affect. Her speech is normal and behavior is normal. Judgment and thought content normal. Cognition and memory are normal.    Nursing note and vitals reviewed.  Vital Signs: BP (!) 152/55   Pulse 94   Temp 98.8 F (37.1 C) (Oral)   Resp 19   Ht 5' 5"  (1.651 m)   Wt 130.9 kg (288 lb 9.3 oz)   SpO2 95%   BMI 48.02 kg/m  SpO2: SpO2: 95 % O2 Device: O2 Device: Nasal Cannula O2 Flow Rate: O2 Flow Rate (L/min): 2 L/min  Intake/output summary:   Intake/Output Summary (Last 24 hours) at 08/29/2017 1249 Last data filed at 08/29/2017 0500 Gross per 24 hour  Intake 1221.54 ml  Output 907 ml  Net 314.54 ml   LBM: Last BM Date: 08/19/2017 Baseline Weight: Weight: 126.9 kg (279 lb 12.2 oz) Most recent weight: Weight: 130.9 kg (288 lb 9.3 oz)       Palliative Assessment/Data: PPS 30% NPO/intubated with plans to extubate    Patient Active Problem List   Diagnosis Date Noted  . Large bowel obstruction ([place])   . 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 Abdominal pain 08/19/2017  . Ileus ([place]) 08/07/2017    Palliative Care Assessment & Plan   Patient Profile: 77 y.o. female admitted on 08/10/2017 from home with abdominal pain for the last 2.5 weeks. She has a past medical history of hyperlipidemia, hypertension, and hypothyroidism. She had been worked up with [place] and CT abdomen revealed a partial distal colonic obstruction with a large amount of stool. She was seen at our ER on Saturday and was discharged after evaluation. She continued to have discomfort and unable to eat or drink fluids, which led her to see Dr. Alice Reichert (Gastroenterologist). He directly admitted her. She reported her last bowel movement was more than a week prior to admission. Since admission patient has received fluid and electrolyte replacement. She is s/p flexible sigmoidoscopy on 08/30/2017 noted for internal hemorrhoids and diverticulosis. An attempt was made for a diverting colostomy on 08/04/2017 which was aborted given severe carcinomatosis; biopsy was taken. She has been seen by Dr. Mike Gip, Oncologist to evaluate plan of care. Palliative  Medicine team consulted for goals of care discussion.   Recommendations/Plan:  FULL CODE -at patient/family's request. Despite knowing the extent of her current condition.  Patient would like all aggressive measures. Patient currently intubated with plans to extubate today.   Continue to treat the treatable. Patient and family is relying on improvement s/p stent placement, biopsy results, and have detailed conversation with Oncology regarding plan of care.   Patient/family would not like to discuss or set up outpatient palliative vs. Hospice at this time. Will continue to have crucial conversations once they have more information on her condition in regards to Oncology.   Would recommend venting gastrostomy tube at some point: as this will be beneficial in future to help with symptoms and medication. This is known to be of benefit with patients with known peritoneal carcinomatosis and management of the potential for high risk malignant bowel obstructions.   If patient develops uncontrolled nausea would recommend scheduled reglan, zofran and compazine for support.   Also would recommend the use of octreotide, decadron, and high dose PPI or H2 antagonist if needed.   Palliative Medicine team will continue to support patient, family, and medical team during hospitalization.   Goals of Care and Additional Recommendations:  Limitations on Scope of Treatment: Full Scope Treatment  Code Status:    Code Status Orders  (From admission, onward)        Start     Ordered   08/24/2017 1732  Full code  Continuous     08/04/2017 1731    Code Status History    This patient has a current code status but no historical code status.       Prognosis:   Unable to determine-guarded to poor in the setting of poor po intake, TPN initiation, decreased mobility, advanced peritoneal carcinomatosis, partial bowel obstruction, unsuccessful attempt for diverting colostomy, obstipation, hypertension, and  hyperlipidemia.   Discharge Planning: To Be Determined - would recommend at minimum outpatient Palliative if patient continues with aggressive measures (chemo/xrt).   Care plan was discussed with patient, family, and bedside RN.   Thank you for allowing the Palliative Medicine Team to assist in the care of this patient.   Total Time 45 min.  Prolonged Time Billed  NO       Greater than 50%  of this time was spent counseling and coordinating care related to the above assessment and plan.  Alda Lea, NP-BC Palliative Medicine Team  Phone: [phone] Fax: [phone] Pager: [phone] Amion: Bjorn Pippin   Please contact Palliative Medicine Team phone at [phone] for questions and concerns.

## 2017-08-29 NOTE — Progress Notes (Signed)
[facility] NOTE   Pharmacy Consult for TPN Indication: Inadequate oral intake related to poor appetite, nausea, vomiting   Patient Measurements: Height: 5\' 5"  (165.1 cm) Weight: 288 lb 9.3 oz (130.9 kg) IBW/kg (Calculated) : 57 TPN AdjBW (KG): 74.5 Body mass index is 48.02 kg/m.  Assessment:  77 yo female with distal bowel obstruction.   TPN Access: central line in place TPN start date: 08/27/17 Nutritional Goals (per RD recommendation on 08/27/17): KCal: 2014 Protein: 100g Fluid: 2292  Goal TPN rate is 83 ml/hr  Plan:  E 5/15 TPN at 35mL/hr. This TPN provides 100 g of protein, 150 g of dextrose, and 60 g of lipids which provides 1776 kCals per day, meeting 90% of patient needs Electrolytes in TPN: sodium phosphate 73mmol IV x 1.  Add MVI, trace elements, thiamine (final day 6/27) NS will be discontinued Monitor TPN labs F/U 6/27  Simpson,Michael L, PharmD 08/29/2017,5:05 PM

## 2017-08-29 NOTE — Progress Notes (Signed)
Nutrition Follow-up  DOCUMENTATION CODES:   Morbid obesity  INTERVENTION:  Plan for today is to provide Clinimix E 5/15 at 83 mL/hr and withhold lipids today. Provides 1414 kcal (71% minimum estimated needs), 100 grams of protein, 1992 mL fluid daily.  If patient is able to remain extubated, can restart 20% lipids tomorrow at 25 mL/hr over 12 hours. Goal regimen with lipids provides 2014 kcal, 100 grams protein, 2292 mL fluid.  Continue adult MVI and trace elements as daily TPN additives. Today is the last day of thiamine 100 mg in TPN.  Continue monitoring potassium, phosphorus, and magnesium daily as patient remains at risk for refeeding syndrome.  Once diet able to be advanced to clear liquids, plan is to restart Boost Breeze po TID, each supplement provides 250 kcal and 9 grams of protein.  NUTRITION DIAGNOSIS:   Inadequate oral intake related to poor appetite, nausea, vomiting as evidenced by per patient/family report.  Ongoing - addressing with TPN.  GOAL:   Patient will meet greater than or equal to 90% of their needs  Met with TPN.  MONITOR:   Diet advancement, Labs, Weight trends, I & O's, Skin  REASON FOR ASSESSMENT:   Malnutrition Screening Tool    ASSESSMENT:   77 year old female who was directly admitted to the hospital by GI clinic for abdominal discomfort. PMH significant for hyperlipidemia, hypertension, and hypothyroidism. KUB showed no evidence of free air and no evidence of large or small bowel obstruction.   -Patient s/p laparotomy on 6/23 with unsuccessful attempted colostomy instead with decompressing JP drain to colon. Findings were large bowel obstruction from carcinomatosis, frozen abdomen with multiple massive peritoneal implants (colon was unable to be mobilized), distended colon without ischemia. -Patient s/p flexible sigmoidoscopy with colonic stent placement on 6/26. Patient subsequently developed severe acute respiratory failure from acute  aspiration pneumonitis and was transferred to ICU on ventilator. -Patient s/p extubation this AM.  Met with patient and family members at bedside after extubation. Patient was resting with eyes closed. She woke up and reported some abdominal pain at incision site surgery. Denies any N/V. Documented to have a smear type 7 BM on 6/25 at 0800. Per RN documentation patient is passing flatus.  IV Access: right cephalic double lumen PICC placed 6/25; ECG technology was used to confirm line terminates in SVC  TPN: on 6/25 TPN was initiated with Clinimix E 5/15 at 41 mL/hr + 20% ILE at 25 mL/hr over 12 hours; on 6/26 advanced to goal regimen of Clinimix E 5/15 at 39mL/hr + 20% ILE at 25 mL/hr over 12hours; receives adult MVI and trace elements as daily TPN additives; also receiving thiamine 100 mg daily in TPN x 3 days  Medications reviewed and include: Novolog 0-9 units Q4hrs (received 7 units past 24 hrs), levothyroxine, sertraline, famotidine, fentanyl gtt now off, sodium phosphate 20 mmol in D5 250 mL once today IV.  Labs reviewed: CBG 130-211 past 24 hrs, Chloride 113, CO2 20, BUN 32, Creatinine 1.15, Phosphorus 2. Potassium and Magnesium WNL.  I/O: 795 mL UOP yesterday (0.3 mL/kg/hr), 112 mL output from left JP drain, 0 mL output from [place] Weight trend: 130.9 kg on 6/27; +4 kg from admission  Discussed with RN and on rounds. Also discussed with Surgeon. Plan is that once patient is extubated and appropriate for PO intake to start her on clear liquid diet and clear liquid oral nutrition supplements.  Diet Order:   Diet Order  Diet NPO time specified  Diet effective now          EDUCATION NEEDS:   No education needs have been identified at this time  Skin:  Skin Assessment: Reviewed RN Assessment(incision abdomen )  Last BM:  6/23- type 7  Height:   Ht Readings from Last 1 Encounters:  08/24/2017 5' 5"  (1.651 m)    Weight:   Wt Readings from Last 1 Encounters:   08/29/17 288 lb 9.3 oz (130.9 kg)    Ideal Body Weight:  56.8 kg  BMI:  Body mass index is 48.02 kg/m.  Estimated Nutritional Needs:   Kcal:  2000-2300kcal/day   Protein:  127-140g/day   Fluid:  >1.7L/day   Willey Blade, MS, RD, LDN Office: [phone] Pager: [phone] After Hours/Weekend Pager: [phone]

## 2017-08-29 NOTE — Progress Notes (Signed)
Discussed CT Abd Pelvis results with surgeon Dr. Hampton Abbot he reviewed CT film no further recommendations at this time.  Marda Stalker, [place] Pager [phone] (please enter 7 digits) PCCM Consult Pager [phone] (please enter 7 digits)

## 2017-08-29 NOTE — Progress Notes (Signed)
Extubated without complications to 2lnc

## 2017-08-29 NOTE — Anesthesia Postprocedure Evaluation (Signed)
Anesthesia Post Note  Patient: Charlott Holler  Procedure(s) Performed: FLEXIBLE SIGMOIDOSCOPY (N/A ) COLONIC STENT PLACEMENT (N/A )  Patient location during evaluation: ICU Anesthesia Type: General Level of consciousness: awake, awake and alert and oriented Pain management: pain level controlled Vital Signs Assessment: post-procedure vital signs reviewed and stable Respiratory status: spontaneous breathing, nonlabored ventilation and respiratory function stable Cardiovascular status: stable Anesthetic complications: no     Last Vitals:  Vitals:   08/29/17 0500 08/29/17 0600  BP: (!) 114/39 (!) 125/41  Pulse: 66 70  Resp: (!) 22 (!) 22  Temp: 37.1 C   SpO2: 97% 98%    Last Pain:  Vitals:   08/29/17 0500  TempSrc: Oral  PainSc:                  Ricki Miller

## 2017-08-30 ENCOUNTER — Encounter: Payer: Self-pay | Admitting: Internal Medicine

## 2017-08-30 DIAGNOSIS — Z9889 Other specified postprocedural states: Secondary | ICD-10-CM

## 2017-08-30 DIAGNOSIS — C801 Malignant (primary) neoplasm, unspecified: Secondary | ICD-10-CM

## 2017-08-30 DIAGNOSIS — C786 Secondary malignant neoplasm of retroperitoneum and peritoneum: Principal | ICD-10-CM

## 2017-08-30 DIAGNOSIS — C799 Secondary malignant neoplasm of unspecified site: Secondary | ICD-10-CM

## 2017-08-30 LAB — GLUCOSE, CAPILLARY
GLUCOSE-CAPILLARY: 135 mg/dL — AB (ref 70–99)
GLUCOSE-CAPILLARY: 145 mg/dL — AB (ref 70–99)
GLUCOSE-CAPILLARY: 146 mg/dL — AB (ref 70–99)
GLUCOSE-CAPILLARY: 147 mg/dL — AB (ref 70–99)
GLUCOSE-CAPILLARY: 152 mg/dL — AB (ref 70–99)
GLUCOSE-CAPILLARY: 155 mg/dL — AB (ref 70–99)
Glucose-Capillary: 139 mg/dL — ABNORMAL HIGH (ref 70–99)

## 2017-08-30 LAB — BASIC METABOLIC PANEL
Anion gap: 9 (ref 5–15)
BUN: 45 mg/dL — ABNORMAL HIGH (ref 8–23)
CHLORIDE: 111 mmol/L (ref 98–111)
CO2: 18 mmol/L — AB (ref 22–32)
Calcium: 8 mg/dL — ABNORMAL LOW (ref 8.9–10.3)
Creatinine, Ser: 1.2 mg/dL — ABNORMAL HIGH (ref 0.44–1.00)
GFR calc Af Amer: 50 mL/min — ABNORMAL LOW (ref >60)
GFR calc non Af Amer: 43 mL/min — ABNORMAL LOW (ref >60)
GLUCOSE: 155 mg/dL — AB (ref 70–99)
POTASSIUM: 4.2 mmol/L (ref 3.5–5.1)
Sodium: 138 mmol/L (ref 135–145)

## 2017-08-30 LAB — PHOSPHORUS: Phosphorus: 4.7 mg/dL — ABNORMAL HIGH (ref 2.5–4.6)

## 2017-08-30 LAB — CBC
HEMATOCRIT: 32.4 % — AB (ref 35.0–47.0)
Hemoglobin: 10.5 g/dL — ABNORMAL LOW (ref 12.0–16.0)
MCH: 28.6 pg (ref 26.0–34.0)
MCHC: 32.4 g/dL (ref 32.0–36.0)
MCV: 88.4 fL (ref 80.0–100.0)
Platelets: 378 10*3/uL (ref 150–440)
RBC: 3.67 MIL/uL — AB (ref 3.80–5.20)
RDW: 16.1 % — ABNORMAL HIGH (ref 11.5–14.5)
WBC: 19.2 10*3/uL — AB (ref 3.6–11.0)

## 2017-08-30 LAB — MAGNESIUM: Magnesium: 2.2 mg/dL (ref 1.7–2.4)

## 2017-08-30 MED ORDER — SODIUM CHLORIDE 0.9 % IV SOLN
3.0000 g | Freq: Four times a day (QID) | INTRAVENOUS | Status: DC
  Administered 2017-08-30 – 2017-09-03 (×17): 3 g via INTRAVENOUS
  Filled 2017-08-30 (×22): qty 3

## 2017-08-30 MED ORDER — HYDROMORPHONE HCL 1 MG/ML IJ SOLN
1.0000 mg | INTRAMUSCULAR | Status: DC | PRN
  Administered 2017-08-30 – 2017-09-01 (×7): 1 mg via INTRAVENOUS
  Filled 2017-08-30 (×6): qty 1

## 2017-08-30 MED ORDER — M.V.I. ADULT IV INJ
INJECTION | INTRAVENOUS | Status: AC
  Administered 2017-08-30: 21:00:00 via INTRAVENOUS
  Filled 2017-08-30: qty 1992

## 2017-08-30 MED ORDER — FAT EMULSION PLANT BASED 20 % IV EMUL
250.0000 mL | INTRAVENOUS | Status: AC
  Administered 2017-08-30: 250 mL via INTRAVENOUS
  Filled 2017-08-30 (×2): qty 250

## 2017-08-30 MED ORDER — MORPHINE SULFATE (PF) 2 MG/ML IV SOLN
2.0000 mg | INTRAVENOUS | Status: DC | PRN
  Administered 2017-08-30 – 2017-08-31 (×2): 2 mg via INTRAVENOUS
  Filled 2017-08-30 (×2): qty 1

## 2017-08-30 NOTE — Progress Notes (Signed)
[facility] NOTE   Pharmacy Consult for TPN Indication: Inadequate oral intake related to poor appetite, nausea, vomiting   Patient Measurements: Height: 5\' 5"  (165.1 cm) Weight: 291 lb 14.2 oz (132.4 kg) IBW/kg (Calculated) : 57 TPN AdjBW (KG): 74.5 Body mass index is 48.57 kg/m.  Assessment:  77 yo female with distal bowel obstruction.   TPN Access: central line in place TPN start date: 08/27/17 Nutritional Goals (per RD recommendation on 08/27/17): KCal: 2014 Protein: 100g Fluid: 2292  Goal TPN rate is 83 ml/hr  Plan:  E 5/15 TPN at 48mL/hr. 20% lipids at 24mL/hr for 12 hours.  This TPN provides 100 g of protein, 150 g of dextrose, and 60 g of lipids which provides 1776 kCals per day, meeting 90% of patient needs Electrolytes in TPN: Phosphorus high. Monitor with am labs.  Add MVI, trace elements Monitor TPN labs  Geo Slone L, PharmD 08/30/2017,7:17 PM

## 2017-08-30 NOTE — Progress Notes (Addendum)
[facility] Day(s): 9.   Post op day(s): 2 Days Post-Op.   Interval History: Patient seen and examined, overnight CT reviewed, patient resumed home CPAP overnight. Patient reports unchanged mild-/moderate- LLQ peri-drain pain and denies any N/V, fever/chills, CP, or SOB, though patient's husband describes he thinks she seemed to be breathing a little heavier this morning. Patient says she has eaten a few ice chips, but has not yet drank/eaten any of her clear liquids diet.  Review of Systems:  Constitutional: denies fever, chills  Respiratory: denies any shortness of breath  Cardiovascular: denies chest pain or palpitations  Gastrointestinal: abdominal pain, N/V, and bowel function as per interval history Musculoskeletal: denies pain, decreased motor or sensation Integumentary: denies any other rashes or skin discolorations except post-surgical wounds  Vital signs in last 24 hours: [min-max] current  Temp:  [98.2 F (36.8 C)-99.2 F (37.3 C)] 98.8 F (37.1 C) (06/28 0747) Pulse Rate:  [79-114] 102 (06/28 0600) Resp:  [12-28] 28 (06/28 0600) BP: (113-167)/(31-82) 167/73 (06/28 0600) SpO2:  [88 %-100 %] 100 % (06/28 0600) Weight:  [291 lb 14.2 oz (132.4 kg)] 291 lb 14.2 oz (132.4 kg) (06/28 0500)     Height: 5\' 5"  (165.1 cm) Weight: 291 lb 14.2 oz (132.4 kg) BMI (Calculated): 48.57   Intake/Output this shift:  Total I/O In: -  Out: 225 [Urine:225]   Intake/Output last 2 shifts:  @IOLAST2SHIFTS @   Physical Exam:  Constitutional: alert, cooperative and no distress  Respiratory: breathing non-labored at rest  Cardiovascular: regular rate and sinus rhythm  Gastrointestinal: soft andmorbidly obese with mild-/moderate- LLQ peri-drain >> peri-incisional tenderness to palpation with wound VAC intact without surrounding erythema or drainage  Labs:  CBC Latest Ref Rng & Units 08/30/2017 08/29/2017 08/19/2017  WBC 3.6 - 11.0 K/uL 19.2(H) 17.4(H) 16.6(H)   Hemoglobin 12.0 - 16.0 g/dL 10.5(L) 10.0(L) 11.3(L)  Hematocrit 35.0 - 47.0 % 32.4(L) 30.3(L) 34.3(L)  Platelets 150 - 440 K/uL 378 346 367   CMP Latest Ref Rng & Units 08/30/2017 08/29/2017 08/27/2017  Glucose 70 - 99 mg/dL 155(H) 155(H) -  BUN 8 - 23 mg/dL 45(H) 32(H) -  Creatinine 0.44 - 1.00 mg/dL 1.20(H) 1.15(H) 1.59(H)  Sodium 135 - 145 mmol/L 138 138 -  Potassium 3.5 - 5.1 mmol/L 4.2 4.2 4.1  Chloride 98 - 111 mmol/L 111 113(H) -  CO2 22 - 32 mmol/L 18(L) 20(L) -  Calcium 8.9 - 10.3 mg/dL 8.0(L) 7.8(L) -  Total Protein 6.5 - 8.1 g/dL - 5.3(L) -  Total Bilirubin 0.3 - 1.2 mg/dL - 0.4 -  Alkaline Phos 38 - 126 U/L - 65 -  AST 15 - 41 U/L - 29 -  ALT 0 - 44 U/L - 15 -   Imaging studies:  CT Abdomen and Pelvis without Contrast (08/30/2017) - personally reviewed and discussed with patient, her family, and Dr. Hampton Abbot 1. There is free intraperitoneal air. This is greater than expected 4 days post laparotomy, but still could be postsurgical in origin. 2. There is no definite source for this air. However, there is a tube that has the appearance of a gastrostomy tube. This enters the left transverse colon. This is likely a colonic tube for decompression. There is air surrounding the tube in the subcutaneous soft tissues at its insertion site. This is a possible source of an air leak. 3. 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 4.  There is mild to moderate right hydronephrosis and right hydroureter. The partial obstruction is at the level of the neoplastic changes around the sigmoid colon. This is also stable from prior CT.  PATHOLOGY (08/24/2017): Malignancy is immunoreactive for PAX 8 and cytokeratin 7. CDX 2, GATA  3, and cytokeratin 20 are negative. The differential diagnosis includes  metastasis from a gynecologic primary (ovarian) and intestinal primary   (neuroendocrine tumor). History of bilateral oophorectomy as noted and  the 2004 outside pathology report is reviewed. Additional immunohisto- chemical stains are obtained and will be issued in an addendum.  Assessment/Plan: 77 y.o.femalewith distal large bowel obstruction attributed to external compression by extensive diffuse peritoneal carcinomatosis5DaysPost-Ops/p laparotomy with unsuccessful attempted colostomy instead with decompressing colon-drainage tube and 2 Days Post-Procedure s/p colonoscopic placement of decompressing colonic stents x2, complicated bypre-stent aspiration pneumonia/pneumonitis, leukocytosis (possibly attributable to post-aspiration vs translocation of bacteria around colostomy tube during colonoscopy), and bycomorbidities includingmorbid obesity (BMI 47), HTN, hypercholesterolemia, thyroid disease (not otherwise specified), and chronic back pain.  - follow up pending pathology - pain control prn (minimize narcotics)             - clear liquids diet + clear liquids supplements - monitor ongoing abdominal exam and bowel function  - consider antibiotics for aspiration pneumonia with increased WBC - palliative decompressing distal colonic stent byDr. Alice Reichert appreciated - if ovarian etiology determined, recommend gyn oncology consultation - attempted to answer as many of patient's and her family's questions as possible - medical management of comorbidities as per primary team - DVT prophylaxis, ambulation encouraged  - no further surgical intervention  All of the above findings and recommendations were discussed with the patient, patient's family, and the medical team, and all of patient's and family's questions were answered to their expressed satisfaction.   Thank you for the opportunity to participate in this patient's care.  -- Marilynne Drivers Rosana Hoes, MD,  [place]: [facility] - Partnering for exceptional care. Office: [phone]

## 2017-08-30 NOTE — Progress Notes (Signed)
Alicia Lame, MD [facility]   [address] [place], [place] [address] Phone: [phone] Fax : [phone]   Subjective: Cross cover for Dr. Alice Reichert.  The patient is in the ICU and sedated and not able to give much history.  The patient's husband states that his wife is reporting severe abdominal pain since having surgery and a stent placed in her calling yesterday for what appears to be diffuse metastatic disease.   Objective: Vital signs in last 24 hours: Vitals:   08/30/17 1146 08/30/17 1200 08/30/17 1300 08/30/17 1400  BP:  (!) 150/71 (!) 141/46 140/67  Pulse:  (!) 114 (!) 110   Resp:  _0 Temp: 98.8 F (37.1 C)     TempSrc: Oral     SpO2:  95% 96%   Weight:      Height:       Weight change: 3 lb 4.9 oz (1.5 kg)  Intake/Output Summary (Last 24 hours) at 08/30/2017 1455 Last data filed at 08/30/2017 1400 Gross per 24 hour  Intake 2702.27 ml  Output 1380 ml  Net 1322.27 ml     Exam: Sedated resting comfortably. Abdomen soft nondistended  Lab Results: _1 @ Micro Results: Recent Results (from the past 240 hour(s))  C difficile quick scan w PCR reflex     Status: None   Collection Time: 08/22/17  4:43 PM  Result Value Ref Range Status   C Diff antigen NEGATIVE NEGATIVE Final   C Diff toxin NEGATIVE NEGATIVE Final   C Diff interpretation No C. difficile detected.  Final    Comment: Performed at [facility], [address] [place], [place] [address]  Gastrointestinal Panel by PCR , Stool     Status: None   Collection Time: 08/22/17  4:43 PM  Result Value Ref Range Status   Campylobacter species NOT DETECTED NOT DETECTED Final   Plesimonas shigelloides NOT DETECTED NOT DETECTED Final   Salmonella species NOT DETECTED NOT DETECTED Final   Yersinia enterocolitica NOT DETECTED NOT DETECTED Final   Vibrio species NOT DETECTED NOT DETECTED Final   Vibrio cholerae NOT DETECTED NOT DETECTED Final   Enteroaggregative E coli (EAEC) NOT  DETECTED NOT DETECTED Final   Enteropathogenic E coli (EPEC) NOT DETECTED NOT DETECTED Final   Enterotoxigenic E coli (ETEC) NOT DETECTED NOT DETECTED Final   Shiga like toxin producing E coli (STEC) NOT DETECTED NOT DETECTED Final   Shigella/Enteroinvasive E coli (EIEC) NOT DETECTED NOT DETECTED Final   Cryptosporidium NOT DETECTED NOT DETECTED Final   Cyclospora cayetanensis NOT DETECTED NOT DETECTED Final   Entamoeba histolytica NOT DETECTED NOT DETECTED Final   Giardia lamblia NOT DETECTED NOT DETECTED Final   Adenovirus F40/41 NOT DETECTED NOT DETECTED Final   Astrovirus NOT DETECTED NOT DETECTED Final   Norovirus GI/GII NOT DETECTED NOT DETECTED Final   Rotavirus A NOT DETECTED NOT DETECTED Final   Sapovirus (I, II, IV, and V) NOT DETECTED NOT DETECTED Final    Comment: Performed at [facility], [address] [place], [place] [address]   Studies/Results: Ct Abdomen Pelvis Wo Contrast  Result Date: 08/29/2017 CLINICAL DATA:  Abdominal xray revealed collection of air below the left hemidiaphragm likely intraluminal, however unable to exclude a component of free peritoneal air radiologist recommended CT Abd Pelvis. Patient has abdominal pain. Medical record history reports a laparotomy on 08/16/2017. EXAM: CT ABDOMEN AND PELVIS WITHOUT CONTRAST TECHNIQUE: Multidetector CT imaging of the abdomen and pelvis was performed following the standard  protocol without IV contrast. COMPARISON:  Current abdomen radiographs. Prior CT dated 08/22/2017. FINDINGS: Lower chest: Small pleural effusions. There is dependent lung base opacity, left greater than right, consistent with atelectasis. Heart is normal in size. Hepatobiliary: There are 4 low-density liver masses consistent with cysts, largest from the posterior segment of the right lobe measuring 5 cm. No other liver abnormalities. The gallbladder is distended. There is dependent density in the gallbladder consistent with sludge, stones or a  combination. No wall thickening. No bile duct dilation. Pancreas: Unremarkable. No pancreatic ductal dilatation or surrounding inflammatory changes. Spleen: Normal in size without focal abnormality. Adrenals/Urinary Tract: No adrenal masses. Mild to moderate right hydronephrosis and hydroureter. Right ureter is dilated into the pelvis. It is decompressed beginning just above the posterior bladder. No left hydronephrosis. Left ureter is normal course and in caliber. No intrarenal stones. Hyperattenuating mass, average Hounsfield units of 39, arises from the upper pole the right kidney, likely a cyst complicated by hemorrhage or proteinaceous contents. It measures 15 mm. There is a smaller more subtle similar-appearing mass from the medial upper pole the left kidney. No other renal masses. Bladder is decompressed by Foley catheter. Stomach/Bowel: The distal sigmoid colon is irregular with adjacent nodular opacities, consistent with colon carcinoma. Above this irregular area of sigmoid colon lies a stent which extends from the mid sigmoid colon to the lower descending colon. There is a percutaneously placed 2 at enters the left mid abdomen. The tip of this 2 has a configuration consistent with a gastrostomy tube. However, this lies in the:. There is significant subcutaneous air adjacent to the tube in the left mid abdomen lateral to the umbilicus. This portion of the colon is mostly decompressed. Right colon is normal in caliber. An no inflammatory changes. Stomach is mild-to-moderately distended. There is dependent fluid with some dependent contrast. No wall thickening or adjacent inflammation. Small bowel is normal in caliber. There is free intraperitoneal air that is collecting along the anterior mid to upper abdomen, accounting for the abnormality noted on the current radiographs. A trace amount of dense material lies adjacent to the spleen where it is in close proximity to the gastric fundus. Other small foci of  dense material lie along the surface of the right liver lobe. These findings were present on the CT from 08/22/2017 and suggest peritoneal carcinomatosis. No ascites. Vascular/Lymphatic: No pathologically enlarged lymph nodes. Aortic atherosclerosis. Reproductive: Uterus is normal size and configuration. No ovarian/adnexal masses. Other: There is postsurgical scarring with apparent hernia mesh along the anterior abdominal midline stable from the prior CT. A surgical drainage catheter enters the left lower quadrant. Musculoskeletal: No acute fracture or acute finding. 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 No osteoblastic or osteolytic lesions. IMPRESSION: 1. There is free intraperitoneal air. This is greater than expected 4 days post laparotomy, but still could be postsurgical in origin. 2. There is no definite source for this air. However, there is a tube that has the appearance of a gastrostomy tube. This enters the left transverse colon. This is likely a colonic tube for decompression. There is air surrounding the tube in the subcutaneous soft tissues at its insertion site. This is a possible source of an air leak. 3. 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 4. There is mild to moderate right hydronephrosis and right hydroureter. The partial obstruction is at the level of  the neoplastic changes around the sigmoid colon. This is also stable from prior CT. Electronically Signed   By: Lajean Manes M.D.   On: 08/29/2017 20:59   Dg Abd 1 View  Result Date: 08/07/2017 CLINICAL DATA:  Initial evaluation for enteric tube placement. EXAM: ABDOMEN - 1 VIEW COMPARISON:  None. FINDINGS: Enteric tube in place with tip in side hole seen overlying the stomach, well beyond the GE junction. Tip projects distally. Visualized bowel gas pattern within normal limits. IMPRESSION:  Enteric tube in place with tip and side hole overlying the stomach, well beyond the GE junction. Electronically Signed   By: Jeannine Boga M.D.   On: 08/08/2017 20:20   Dg Abd Portable 2v  Result Date: 08/29/2017 CLINICAL DATA:  Peritoneal carcinomatosis.  Colonic obstruction. EXAM: PORTABLE ABDOMEN - 2 VIEW COMPARISON:  Acute abdominal series 08/26/2017 and KUB 08/26/2017 FINDINGS: Examination demonstrates collection of air under the left hemidiaphragm on the upright film some which is intraluminal although it would be difficult to exclude free peritoneal air. Bowel gas pattern is otherwise nonobstructive. There are 2 catheters over the left lower quadrant/pelvis. Skin staples over the soft tissues of the lateral left pelvis/lower abdomen. Remainder of the exam is unchanged. IMPRESSION: Nonobstructive bowel gas pattern. Collection of air below the left hemidiaphragm on the upright film likely intraluminal although cannot exclude a component of free peritoneal air. Recommend clinical correlation and consider left-side-down decubitus films to exclude free peritoneal air. Two catheters over the left lower quadrant/left pelvis. These results were called by telephone at the time of interpretation on 08/29/2017 at 5:28 pm to patient's nurse Council Mechanic, who verbally acknowledged these results and will notify patient's physician. Electronically Signed   By: Marin Olp M.D.   On: 08/29/2017 17:28   Dg C-arm 1-60 Min-no Report  Result Date: 08/13/2017 Fluoroscopy was utilized by the requesting physician.  No radiographic interpretation.   Medications: I have reviewed the patient's current medications. Scheduled Meds: . aspirin EC  81 mg Oral Daily  . atorvastatin  80 mg Oral Daily  . chlorhexidine gluconate (MEDLINE KIT)  15 mL Mouth Rinse BID  . enoxaparin (LOVENOX) injection  40 mg Subcutaneous Q24H  . feeding supplement  1 Container Oral TID BM  . insulin aspart  0-9 Units Subcutaneous Q4H  .  levothyroxine  75 mcg Oral QAC breakfast  . metoprolol tartrate  100 mg Oral BID  . sertraline  100 mg Oral Daily  . sodium chloride flush  10-40 mL Intracatheter Q12H   Continuous Infusions: . Marland KitchenTPN (CLINIMIX-E) Adult 83 mL/hr at 08/30/17 0600  . ampicillin-sulbactam (UNASYN) IV Stopped (08/30/17 1215)  . famotidine (PEPCID) IV Stopped (08/29/17 2248)  . fentaNYL infusion INTRAVENOUS Stopped (08/29/17 1015)   PRN Meds:.acetaminophen **OR** acetaminophen, albuterol, alum & mag hydroxide-simeth, bisacodyl, fluticasone, hydrALAZINE, HYDROmorphone (DILAUDID) injection, morphine injection, ondansetron **OR** ondansetron (ZOFRAN) IV, sodium chloride flush   Assessment: Active Problems:   Abdominal pain   Ileus (HCC)   Large bowel obstruction (HCC)   Abdominal carcinomatosis ([place])    Plan: This patient has what appears to be metastatic colon cancer with a colonic stent placed yesterday.  The patient's abdomen is not distended.  Nothing further to do from a GI point of view at this time.   LOS: 9 days   Tito Ausmus 08/30/2017, 2:55 PM

## 2017-08-30 NOTE — Progress Notes (Signed)
   08/30/17 1350  Clinical Encounter Type  Visited With Family  Visit Type Follow-up;Psychological support   Chaplain was walking through ICU and noticed husband of patient standing in doorway to her room.  Chaplain remembered visiting with patient and family a few days ago on 2C, reminded husband of that visit and asked him how things were going for her now.  Husband invited Chaplain into the room to visit.  Husband seemed to understand his wife is getting worse, not better, and that her condition is unlikely to improve dramatically but his statements were worded in third person, such as, "They say she isn't getting better" and "They say she's terminal."   Chaplain provided active and reflective listening as he talked about this.  Follow-up emotional support may be helpful as husband and other family members accept more fully the patient's poor prognosis.  Although patient was sleeping, husband was concerned about her pain level and requested help in sitting her up in bed straight, as she was leaning a bit to one side as she slept.  Chaplain offered to let staff know of his concerns, did so, and reported back to husband that it had been done and someone would be in shortly to help.  Chaplain told husband to ask staff for on-call Chaplain if he would like another visit.

## 2017-08-30 NOTE — Progress Notes (Signed)
Dr. Rosana Hoes spoke with this RN regarding taking PO meds and stated "I would like to see her tolerating clear liquids before she takes pills."

## 2017-08-30 NOTE — Progress Notes (Signed)
Daily Progress Note   Patient Name: Alicia Conley       Date: 08/30/2017 DOB: 1940-06-09  Age: 77 y.o. MRN#: [id number] Attending Physician: Fritzi Mandes, MD Primary Care Physician: [name], MD Admit Date: 08/12/2017  Reason for Consultation/Follow-up: Establishing goals of care  Subjective: Patient is lying in bed awake. Alert & Oriented x3. Successfully extubated on yesterday. Complains of some mild abdominal pain. Husband and granddaughter is present. Family was concerned about air in her abdomen, however states they feel better now that they are aware this can be expected post surgery and given she has a colonic stent/tube. Patient is able to have clear liquids, however patient and family reports she has not drank much. Patient states she does not feel like drinking anything and all she wants is ice. 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 Family continues to want full aggressive measures including full code, which could lead to CPR/intubation.  Patient and family knows to contact me with any questions or concerns.   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   Chart Reviewed and report received from bedside RN, Brittney.   Length of Stay: 9  Current Medications: Scheduled Meds:  . aspirin EC  81 mg Oral Daily  . atorvastatin  80 mg Oral Daily  . chlorhexidine gluconate (MEDLINE KIT)  15 mL Mouth Rinse BID  . enoxaparin (LOVENOX) injection  40 mg Subcutaneous Q24H  . feeding supplement  1 Container Oral TID BM  . insulin aspart  0-9  Units Subcutaneous Q4H  . levothyroxine  75 mcg Oral QAC breakfast  . metoprolol tartrate  100 mg Oral BID  . sertraline  100 mg Oral Daily  . sodium chloride flush  10-40 mL Intracatheter Q12H    Continuous Infusions: . Marland KitchenTPN (CLINIMIX-E) Adult 83 mL/hr at 08/30/17 0600  . ampicillin-sulbactam (UNASYN) IV    . famotidine (PEPCID) IV Stopped (08/29/17 2248)  . fentaNYL infusion INTRAVENOUS Stopped (08/29/17 1015)    PRN Meds: acetaminophen **OR** acetaminophen, albuterol, alum & mag hydroxide-simeth, bisacodyl, fluticasone, hydrALAZINE, HYDROmorphone (DILAUDID) injection, morphine injection, ondansetron **OR** ondansetron (ZOFRAN) IV, sodium chloride flush  Physical Exam  Constitutional: Awake and alert. Vital signs are normal. She appears well-developed. She is cooperative. She has a sickly appearance.  Cardiovascular: Normal rate, regular rhythm, normal heart sounds, intact distal pulses and normal pulses.  Pulmonary/Chest: Effort normal and breath sounds rhonchi +.  Abdominal: Normal appearance. Bowel sounds are decreased. Abdominal dressing s/p palliative colonic stent with wound vac.  Neurological: She is alert and oriented to person, place, and time.  Skin: Right upper arm PICC line in place.  Psychiatric: She has a normal mood and affect. Her speech is normal and behavior is normal. Judgment and thought content normal. Cognition and memory are normal.  Nursing note and vitals reviewed.  Vital Signs: BP 139/71   Pulse (!) 102   Temp 98.8 F (37.1 C) (Axillary)   Resp 15   Ht 5' 5"  (1.651 m)   Wt 132.4 kg (291 lb 14.2 oz)   SpO2 95%   BMI 48.57 kg/m  SpO2: SpO2: 95 % O2 Device: O2 Device: CPAP O2 Flow Rate: O2 Flow Rate (L/min): 6 L/min  Intake/output summary:   Intake/Output Summary (Last 24 hours) at 08/30/2017 1100 Last data filed at 08/30/2017 1000 Gross per 24 hour  Intake 2270.27 ml  Output 1155 ml  Net 1115.27 ml   LBM: Last BM Date: 08/27/2017 Baseline  Weight: Weight: 126.9 kg (279 lb 12.2 oz) Most recent weight: Weight: 132.4 kg (291 lb 14.2 oz)       Palliative Assessment/Data: PPS 30%    Patient Active Problem List   Diagnosis Date Noted  . Large bowel obstruction ([place])   . 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 Abdominal pain 08/29/2017  . Ileus ([place]) 08/12/2017    Palliative Care Assessment & Plan   Patient Profile: 77 y.o. female admitted on 08/05/2017 from home with abdominal pain for the last 2.5 weeks. She has a past medical history of hyperlipidemia, hypertension, and hypothyroidism. She had been worked up with [place] and CT abdomen revealed a partial distal colonic obstruction with a large amount of stool. She was seen at our ER on Saturday and was discharged after evaluation. She continued to have discomfort and unable to eat or drink fluids, which led her to see Dr. Alice Reichert (Gastroenterologist). He directly admitted her. She reported her last bowel movement was more than a week prior to admission. Since admission patient has received fluid and electrolyte replacement. She is s/p flexible sigmoidoscopy on 08/06/2017 noted for internal hemorrhoids and diverticulosis. An attempt was made for a diverting colostomy on 08/22/2017 which was aborted given severe carcinomatosis; biopsy was taken. She has been seen by Dr. Mike Gip, Oncologist to evaluate plan of care. Palliative Medicine team consulted for goals of care discussion.   Recommendations/Plan:  FULL CODE -at patient/family's request. Despite knowing the extent of her current condition. Patient would like all aggressive measures. Patient successfully extubated on yesterday.   Continue to treat the treatable. Patient and family is relying on improvement s/p stent placement, biopsy results, and have detailed conversation with Oncology regarding plan of care. 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 They are prepared to move forward with options  and guidance from oncology.  Patient/family would not like to discuss or set up outpatient palliative vs. Hospice at this time. Will continue to have crucial conversations once they have more information on her condition in regards to Oncology.   Would recommend venting gastrostomy tube at some point: as this will be beneficial in future to help  with symptoms and medication. This is known to be of benefit with patients with known peritoneal carcinomatosis and management of the potential for high risk malignant bowel obstructions.   If patient develops uncontrolled nausea would recommend scheduled reglan, zofran and compazine for support.   Also would recommend the use of octreotide, decadron, and high dose PPI or H2 antagonist if needed.   Palliative Medicine team will continue to support patient, family, and medical team during hospitalization.   Goals of Care and Additional Recommendations:  Limitations on Scope of Treatment: Full Scope Treatment  Code Status:    Code Status Orders  (From admission, onward)        Start     Ordered   08/22/2017 1732  Full code  Continuous     08/30/2017 1731    Code Status History    This patient has a current code status but no historical code status.       Prognosis:   Unable to determine-guarded to poor in the setting of poor po intake, TPN initiation, decreased mobility, advanced peritoneal carcinomatosis, metastatic ovarian and neuroendocrine tumor, partial bowel obstruction, unsuccessful attempt for diverting colostomy, obstipation, hypertension, and hyperlipidemia.   Discharge Planning: To Be Determined - would recommend at minimum outpatient Palliative if patient continues with aggressive measures (chemo/xrt).   Care plan was discussed with patient, family, and bedside RN.   Thank you for allowing the Palliative Medicine Team to assist in the care of this patient.   Total Time 25 min.  Prolonged Time Billed  NO       Greater  than 50%  of this time was spent counseling and coordinating care related to the above assessment and plan.  Alda Lea, NP-BC Palliative Medicine Team  Phone: [phone] Fax: [phone] Pager: [phone] Amion: Bjorn Pippin   Please contact Palliative Medicine Team phone at [phone] for questions and concerns.

## 2017-08-30 NOTE — Progress Notes (Signed)
[facility] [place] NAME: Alicia Conley    MR#:  [id number]  DATE OF BIRTH:  [birth year]  SUBJECTIVE:  Patient without complaint, no events overnight per nursing staff Family in the room Pt not wanting to try diet she is worried she is going to vomit REVIEW OF SYSTEMS:  Review of Systems  Constitutional: Negative for chills, fever and weight loss.  HENT: Negative for ear discharge, ear pain and nosebleeds.   Eyes: Negative for blurred vision, pain and discharge.  Respiratory: Negative for sputum production, shortness of breath, wheezing and stridor.   Cardiovascular: Negative for chest pain, palpitations, orthopnea and PND.  Gastrointestinal: Negative for abdominal pain, diarrhea, nausea and vomiting.  Genitourinary: Negative for frequency and urgency.  Musculoskeletal: Negative for back pain and joint pain.  Neurological: Positive for weakness. Negative for sensory change, speech change and focal weakness.  Psychiatric/Behavioral: Negative for depression and hallucinations. The patient is not nervous/anxious.     DRUG ALLERGIES:  No Known Allergies  VITALS:  Blood pressure (!) 150/71, pulse (!) 114, temperature 98.8 F (37.1 C), temperature source Oral, resp. rate 14, height 5\' 5"  (1.651 m), weight 132.4 kg (291 lb 14.2 oz), SpO2 95 %.  PHYSICAL EXAMINATION:  GENERAL:  77 y.o.-year-old patient lying in the bed with no acute distress. obese EYES: Pupils equal, round, reactive to light and accommodation. No scleral icterus. Extraocular muscles intact.  HEENT: Head atraumatic, normocephalic. Oropharynx and nasopharynx clear.  NECK:  Supple, no jugular venous distention. No thyroid enlargement, no tenderness.  LUNGS: Normal breath sounds bilaterally, no wheezing, rales,rhonchi or crepitation. No use of accessory muscles of respiration.  CARDIOVASCULAR: S1, S2 normal. No murmurs, rubs, or gallops.  ABDOMEN: Soft, nontender, nondistended.  Bowel sounds present. No organomegaly or mass.  EXTREMITIES: ++ pedal edema,no cyanosis, or clubbing.  NEUROLOGIC: Cranial nerves II through XII are intact. Muscle strength 5/5 in all extremities. Sensation intact. Gait not checked.  PSYCHIATRIC: The patient is alert and oriented x 3.  SKIN: No obvious rash, lesion, or ulcer.   Physical Exam LABORATORY PANEL:   CBC Recent Labs  Lab 08/30/17 0605  WBC 19.2*  HGB 10.5*  HCT 32.4*  PLT 378   ------------------------------------------------------------------------------------------------------------------  Chemistries  Recent Labs  Lab 08/29/17 0516 08/30/17 0605  NA 138 138  K 4.2 4.2  CL 113* 111  CO2 20* 18*  GLUCOSE 155* 155*  BUN 32* 45*  CREATININE 1.15* 1.20*  CALCIUM 7.8* 8.0*  MG 2.0 2.2  AST 29  --   ALT 15  --   ALKPHOS 65  --   BILITOT 0.4  --    ------------------------------------------------------------------------------------------------------------------  Cardiac Enzymes No results for input(s): TROPONINI in the last 168 hours. ------------------------------------------------------------------------------------------------------------------  RADIOLOGY:  Ct Abdomen Pelvis Wo Contrast  Result Date: 08/29/2017 CLINICAL DATA:  Abdominal xray revealed collection of air below the left hemidiaphragm likely intraluminal, however unable to exclude a component of free peritoneal air radiologist recommended CT Abd Pelvis. Patient has abdominal pain. Medical record history reports a laparotomy on 08/29/2017. EXAM: CT ABDOMEN AND PELVIS WITHOUT CONTRAST TECHNIQUE: Multidetector CT imaging of the abdomen and pelvis was performed following the standard protocol without IV contrast. COMPARISON:  Current abdomen radiographs. Prior CT dated 08/22/2017. FINDINGS: Lower chest: Small pleural effusions. There is dependent lung base opacity, left greater than right, consistent with atelectasis. Heart is normal in size.  Hepatobiliary: There are 4 low-density liver masses consistent with cysts, largest from the posterior  segment of the right lobe measuring 5 cm. No other liver abnormalities. The gallbladder is distended. There is dependent density in the gallbladder consistent with sludge, stones or a combination. No wall thickening. No bile duct dilation. Pancreas: Unremarkable. No pancreatic ductal dilatation or surrounding inflammatory changes. Spleen: Normal in size without focal abnormality. Adrenals/Urinary Tract: No adrenal masses. Mild to moderate right hydronephrosis and hydroureter. Right ureter is dilated into the pelvis. It is decompressed beginning just above the posterior bladder. No left hydronephrosis. Left ureter is normal course and in caliber. No intrarenal stones. Hyperattenuating mass, average Hounsfield units of 39, arises from the upper pole the right kidney, likely a cyst complicated by hemorrhage or proteinaceous contents. It measures 15 mm. There is a smaller more subtle similar-appearing mass from the medial upper pole the left kidney. No other renal masses. Bladder is decompressed by Foley catheter. Stomach/Bowel: The distal sigmoid colon is irregular with adjacent nodular opacities, consistent with colon carcinoma. Above this irregular area of sigmoid colon lies a stent which extends from the mid sigmoid colon to the lower descending colon. There is a percutaneously placed 2 at enters the left mid abdomen. The tip of this 2 has a configuration consistent with a gastrostomy tube. However, this lies in the:. There is significant subcutaneous air adjacent to the tube in the left mid abdomen lateral to the umbilicus. This portion of the colon is mostly decompressed. Right colon is normal in caliber. An no inflammatory changes. Stomach is mild-to-moderately distended. There is dependent fluid with some dependent contrast. No wall thickening or adjacent inflammation. Small bowel is normal in caliber. There is  free intraperitoneal air that is collecting along the anterior mid to upper abdomen, accounting for the abnormality noted on the current radiographs. A trace amount of dense material lies adjacent to the spleen where it is in close proximity to the gastric fundus. Other small foci of dense material lie along the surface of the right liver lobe. These findings were present on the CT from 08/22/2017 and suggest peritoneal carcinomatosis. No ascites. Vascular/Lymphatic: No pathologically enlarged lymph nodes. Aortic atherosclerosis. Reproductive: Uterus is normal size and configuration. No ovarian/adnexal masses. Other: There is postsurgical scarring with apparent hernia mesh along the anterior abdominal midline stable from the prior CT. A surgical drainage catheter enters the left lower quadrant. Musculoskeletal: No acute fracture or acute finding. 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 No osteoblastic or osteolytic lesions. IMPRESSION: 1. There is free intraperitoneal air. This is greater than expected 4 days post laparotomy, but still could be postsurgical in origin. 2. There is no definite source for this air. However, there is a tube that has the appearance of a gastrostomy tube. This enters the left transverse colon. This is likely a colonic tube for decompression. There is air surrounding the tube in the subcutaneous soft tissues at its insertion site. This is a possible source of an air leak. 3. 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 4. There is mild to moderate right hydronephrosis and right hydroureter. The partial obstruction is at the level of the neoplastic changes around the sigmoid colon. This is also stable from prior CT. Electronically Signed   By: Lajean Manes M.D.   On: 08/29/2017 20:59   Dg Abd 1 View  Result Date: 08/03/2017 CLINICAL DATA:   Initial evaluation for enteric tube placement. EXAM: ABDOMEN - 1  VIEW COMPARISON:  None. FINDINGS: Enteric tube in place with tip in side hole seen overlying the stomach, well beyond the GE junction. Tip projects distally. Visualized bowel gas pattern within normal limits. IMPRESSION: Enteric tube in place with tip and side hole overlying the stomach, well beyond the GE junction. Electronically Signed   By: Jeannine Boga M.D.   On: 08/11/2017 20:20   Dg Abd Portable 2v  Result Date: 08/29/2017 CLINICAL DATA:  Peritoneal carcinomatosis.  Colonic obstruction. EXAM: PORTABLE ABDOMEN - 2 VIEW COMPARISON:  Acute abdominal series 08/26/2017 and KUB 08/27/2017 FINDINGS: Examination demonstrates collection of air under the left hemidiaphragm on the upright film some which is intraluminal although it would be difficult to exclude free peritoneal air. Bowel gas pattern is otherwise nonobstructive. There are 2 catheters over the left lower quadrant/pelvis. Skin staples over the soft tissues of the lateral left pelvis/lower abdomen. Remainder of the exam is unchanged. IMPRESSION: Nonobstructive bowel gas pattern. Collection of air below the left hemidiaphragm on the upright film likely intraluminal although cannot exclude a component of free peritoneal air. Recommend clinical correlation and consider left-side-down decubitus films to exclude free peritoneal air. Two catheters over the left lower quadrant/left pelvis. These results were called by telephone at the time of interpretation on 08/29/2017 at 5:28 pm to patient's nurse Council Mechanic, who verbally acknowledged these results and will notify patient's physician. Electronically Signed   By: Marin Olp M.D.   On: 08/29/2017 17:28   Dg C-arm 1-60 Min-no Report  Result Date: 08/19/2017 Fluoroscopy was utilized by the requesting physician.  No radiographic interpretation.    ASSESSMENT AND PLAN:  77 year old female patient with history of hypertension,  hyperlipidemia, hypothyroidism referred from GI clinic for direct admission for abdominal discomfort.  *Acute Peritoneal carcinomatosis Etiology unknown--?ovarian mass -Status post flexible sigmoidoscopy noted for internal hemorrhoids/diverticulosis August 23, 2017 -Status post attempted diverting colostomy on August 25, 2017 which was aborted given severe carcinomatosis-sample  taken and sent to lab for pathology review -s/p colonic stent placement  by gastroenterology/Dr. Alice Reichert -pt intubated since aspirated prior to cprocedure. Now extubated and on 2 liter [place]  *Acute Hypokalemia Repleted  *Acute hypomagnesemia Repleted with IV magnesium  *Chronic benign essential hypertension Continue current regiment  * ChronicHyperlipidemia Resume statinupon discharge  Condition stable Prognosis poor -palliative care consulted Disposition pending clinical course  All the records are reviewed and case discussed with Care Management/Social Workerr. Management plans discussed with the patient, family and they are in agreement.  CODE STATUS: full  TOTAL TIME TAKING CARE OF THIS PATIENT: 25 minutes.     POSSIBLE D/C IN 1-5 DAYS, DEPENDING ON CLINICAL CONDITION.   Fritzi Mandes M.D on 08/30/2017   Between 7am to 6pm - Pager - [phone]  After 6pm go to [email or web address] - password [email or web address] [facility]  Office  [phone]  CC: Primary care physician; [name], MD  Note: This dictation was prepared with Dragon dictation along with smaller phrase technology. Any transcriptional errors that result from this process are unintentional.

## 2017-08-30 NOTE — Progress Notes (Signed)
Follow up - Critical Care Medicine Note  Patient Details:    Alicia Conley is an 77 y.o. female.with distal large bowel obstruction attributed to external compression by extensive diffuse peritoneal carcinomatosis Post-Ops/p laparotomy with unsuccessful attempted colostomy instead with decompressing colon-drainage tube, complicated bypost-surgical leukocytosis and bycomorbidities includingmorbid obesity  Lines, Airways, Drains: Airway 7 mm (Active)  Secured at (cm) 23 cm 08/29/2017  8:02 AM  Measured From Lips 08/29/2017  8:02 AM  Secured Location Right 08/29/2017  8:02 AM  Secured By [employer] 08/29/2017  8:02 AM  Tube Holder Repositioned Yes 08/29/2017  4:03 AM  Cuff Pressure (cm H2O) 24 cm H2O 08/29/2017  8:02 AM  Site Condition Dry 08/29/2017  4:03 AM     PICC Double Lumen 08/27/17 PICC Right Cephalic 43 cm 0 cm (Active)  Indication for Insertion or Continuance of Line Administration of hyperosmolar/irritating solutions (i.e. TPN, Vancomycin, etc.) 08/30/2017  8:00 AM  Exposed Catheter (cm) 0 cm 08/27/2017  3:00 PM  Site Assessment Clean;Dry;Intact 08/15/2017  7:53 PM  Lumen #1 Status Infusing 08/16/2017  7:53 PM  Lumen #2 Status Infusing 08/07/2017  7:53 PM  Dressing Type Transparent;Securing device 08/16/2017  7:53 PM  Dressing Status Clean;Dry;Intact;Antimicrobial disc in place 08/18/2017  7:53 PM  Dressing Change Due 08/04/17 08/29/2017  7:53 PM     Closed System Drain Other (Comment);Left LLQ Bulb (JP) (Active)  Site Description Unremarkable 08/27/2017  8:00 PM  Dressing Status Clean;Dry;Intact 08/30/2017  4:00 AM  Drainage Appearance Serosanguineous 08/29/2017 12:30 AM  Status To suction (Charged) 08/29/2017 12:30 AM  Intake (mL) 90 ml 08/07/2017  8:47 AM  Output (mL) 10 mL 08/30/2017  5:00 AM     Open Drain 1 Left;Midline;Lateral Abdomen  (Active)  Site Description Unable to view 08/13/2017  7:30 PM  Dressing Status Clean;Dry 08/07/2017  7:30 PM  Drainage Appearance  Brown 08/27/2017  8:00 AM  Status Unclamped 08/27/2017  8:00 AM  Output (mL) 0 mL 08/12/2017  7:30 PM     Negative Pressure Wound Therapy Abdomen Mid (Active)  Site / Wound Assessment Clean;Dry 08/30/2017  4:00 AM  Peri-wound Assessment Intact 08/09/2017  7:30 PM  Output (mL) 0 mL 08/29/2017  6:00 PM     Urethral Catheter K Somers RN Latex 16 Fr. (Active)  Indication for Insertion or Continuance of Catheter Unstable critical patients (first 24-48 hours) 08/30/2017  4:00 AM  Site Assessment Clean 08/30/2017  4:00 AM  Catheter Maintenance Bag below level of bladder;Catheter secured;Drainage bag/tubing not touching floor;Insertion date on drainage bag;No dependent loops;Seal intact 08/30/2017  8:00 AM  Collection Container Standard drainage bag 08/30/2017  4:00 AM  Securement Method Securing device (Describe) 08/30/2017  4:00 AM  Output (mL) 225 mL 08/30/2017  7:47 AM    Anti-infectives:  Anti-infectives (From admission, onward)   Start     Dose/Rate Route Frequency Ordered Stop   08/30/17 1045  Ampicillin-Sulbactam (UNASYN) 3 g in sodium chloride 0.9 % 100 mL IVPB     3 g 200 mL/hr over 30 Minutes Intravenous Every 6 hours 08/30/17 1034     08/07/2017 1800  piperacillin-tazobactam (ZOSYN) IVPB 3.375 g  Status:  Discontinued     3.375 g 12.5 mL/hr over 240 Minutes Intravenous Every 8 hours 08/06/2017 1637 08/26/17 0126   08/09/2017 1730  piperacillin-tazobactam (ZOSYN) IVPB 3.375 g  Status:  Discontinued     3.375 g 12.5 mL/hr over 240 Minutes Intravenous Every 8 hours 08/16/2017 1728 08/26/17 0136   08/09/2017 1445  piperacillin-tazobactam (ZOSYN) IVPB 3.375 g     3.375 g 100 mL/hr over 30 Minutes Intravenous  Once 08/06/2017 1437 08/18/2017 1516   08/24/17 1800  cefOXitin (MEFOXIN) 2 g in sodium chloride 0.9 % 100 mL IVPB  Status:  Discontinued     2 g 200 mL/hr over 30 Minutes Intravenous Every 6 hours 08/24/17 1606 08/27/2017 0844      Microbiology: Results for orders placed or performed during the  hospital encounter of 08/04/2017  C difficile quick scan w PCR reflex     Status: None   Collection Time: 08/22/17  4:43 PM  Result Value Ref Range Status   C Diff antigen NEGATIVE NEGATIVE Final   C Diff toxin NEGATIVE NEGATIVE Final   C Diff interpretation No C. difficile detected.  Final    Comment: Performed at [facility], [address] [place], [place] [address]  Gastrointestinal Panel by PCR , Stool     Status: None   Collection Time: 08/22/17  4:43 PM  Result Value Ref Range Status   Campylobacter species NOT DETECTED NOT DETECTED Final   Plesimonas shigelloides NOT DETECTED NOT DETECTED Final   Salmonella species NOT DETECTED NOT DETECTED Final   Yersinia enterocolitica NOT DETECTED NOT DETECTED Final   Vibrio species NOT DETECTED NOT DETECTED Final   Vibrio cholerae NOT DETECTED NOT DETECTED Final   Enteroaggregative E coli (EAEC) NOT DETECTED NOT DETECTED Final   Enteropathogenic E coli (EPEC) NOT DETECTED NOT DETECTED Final   Enterotoxigenic E coli (ETEC) NOT DETECTED NOT DETECTED Final   Shiga like toxin producing E coli (STEC) NOT DETECTED NOT DETECTED Final   Shigella/Enteroinvasive E coli (EIEC) NOT DETECTED NOT DETECTED Final   Cryptosporidium NOT DETECTED NOT DETECTED Final   Cyclospora cayetanensis NOT DETECTED NOT DETECTED Final   Entamoeba histolytica NOT DETECTED NOT DETECTED Final   Giardia lamblia NOT DETECTED NOT DETECTED Final   Adenovirus F40/41 NOT DETECTED NOT DETECTED Final   Astrovirus NOT DETECTED NOT DETECTED Final   Norovirus GI/GII NOT DETECTED NOT DETECTED Final   Rotavirus A NOT DETECTED NOT DETECTED Final   Sapovirus (I, II, IV, and V) NOT DETECTED NOT DETECTED Final    Comment: Performed at [facility], [address] [place], [place] [address]  Studies: Ct Abdomen Pelvis Wo Contrast  Result Date: 08/29/2017 CLINICAL DATA:  Abdominal xray revealed collection of air below the left hemidiaphragm likely intraluminal,  however unable to exclude a component of free peritoneal air radiologist recommended CT Abd Pelvis. Patient has abdominal pain. Medical record history reports a laparotomy on 08/08/2017. EXAM: CT ABDOMEN AND PELVIS WITHOUT CONTRAST TECHNIQUE: Multidetector CT imaging of the abdomen and pelvis was performed following the standard protocol without IV contrast. COMPARISON:  Current abdomen radiographs. Prior CT dated 08/22/2017. FINDINGS: Lower chest: Small pleural effusions. There is dependent lung base opacity, left greater than right, consistent with atelectasis. Heart is normal in size. Hepatobiliary: There are 4 low-density liver masses consistent with cysts, largest from the posterior segment of the right lobe measuring 5 cm. No other liver abnormalities. The gallbladder is distended. There is dependent density in the gallbladder consistent with sludge, stones or a combination. No wall thickening. No bile duct dilation. Pancreas: Unremarkable. No pancreatic ductal dilatation or surrounding inflammatory changes. Spleen: Normal in size without focal abnormality. Adrenals/Urinary Tract: No adrenal masses. Mild to moderate right hydronephrosis and hydroureter. Right ureter is dilated into the pelvis. It is decompressed beginning just above the posterior bladder. No  left hydronephrosis. Left ureter is normal course and in caliber. No intrarenal stones. Hyperattenuating mass, average Hounsfield units of 39, arises from the upper pole the right kidney, likely a cyst complicated by hemorrhage or proteinaceous contents. It measures 15 mm. There is a smaller more subtle similar-appearing mass from the medial upper pole the left kidney. No other renal masses. Bladder is decompressed by Foley catheter. Stomach/Bowel: The distal sigmoid colon is irregular with adjacent nodular opacities, consistent with colon carcinoma. Above this irregular area of sigmoid colon lies a stent which extends from the mid sigmoid colon to the  lower descending colon. There is a percutaneously placed 2 at enters the left mid abdomen. The tip of this 2 has a configuration consistent with a gastrostomy tube. However, this lies in the:. There is significant subcutaneous air adjacent to the tube in the left mid abdomen lateral to the umbilicus. This portion of the colon is mostly decompressed. Right colon is normal in caliber. An no inflammatory changes. Stomach is mild-to-moderately distended. There is dependent fluid with some dependent contrast. No wall thickening or adjacent inflammation. Small bowel is normal in caliber. There is free intraperitoneal air that is collecting along the anterior mid to upper abdomen, accounting for the abnormality noted on the current radiographs. A trace amount of dense material lies adjacent to the spleen where it is in close proximity to the gastric fundus. Other small foci of dense material lie along the surface of the right liver lobe. These findings were present on the CT from 08/22/2017 and suggest peritoneal carcinomatosis. No ascites. Vascular/Lymphatic: No pathologically enlarged lymph nodes. Aortic atherosclerosis. Reproductive: Uterus is normal size and configuration. No ovarian/adnexal masses. Other: There is postsurgical scarring with apparent hernia mesh along the anterior abdominal midline stable from the prior CT. A surgical drainage catheter enters the left lower quadrant. Musculoskeletal: No acute fracture or acute finding. 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 No osteoblastic or osteolytic lesions. IMPRESSION: 1. There is free intraperitoneal air. This is greater than expected 4 days post laparotomy, but still could be postsurgical in origin. 2. There is no definite source for this air. However, there is a tube that has the appearance of a gastrostomy tube. This enters the left transverse colon. This is likely a colonic tube for decompression. There is air surrounding  the tube in the subcutaneous soft tissues at its insertion site. This is a possible source of an air leak. 3. 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 4. There is mild to moderate right hydronephrosis and right hydroureter. The partial obstruction is at the level of the neoplastic changes around the sigmoid colon. This is also stable from prior CT. Electronically Signed   By: Lajean Manes M.D.   On: 08/29/2017 20:59   Dg Abd 1 View  Result Date: 08/10/2017 CLINICAL DATA:  Initial evaluation for enteric tube placement. EXAM: ABDOMEN - 1 VIEW COMPARISON:  None. FINDINGS: Enteric tube in place with tip in side hole seen overlying the stomach, well beyond the GE junction. Tip projects distally. Visualized bowel gas pattern within normal limits. IMPRESSION: Enteric tube in place with tip and side hole overlying the stomach, well beyond the GE junction. Electronically Signed   By: Jeannine Boga M.D.   On: 08/18/2017 20:20   Ct Abdomen Pelvis W Contrast  Result Date: 08/22/2017 CLINICAL DATA:  Follow-up CT scan concerning  for bowel obstruction. LEFT lower quadrant pain abdominal pain for 2 weeks. Last bowel movement 1 week ago. EXAM: CT ABDOMEN AND PELVIS WITH CONTRAST TECHNIQUE: Multidetector CT imaging of the abdomen and pelvis was performed using the standard protocol following bolus administration of intravenous contrast. CONTRAST:  20mL ISOVUE-300 IOPAMIDOL (ISOVUE-300) INJECTION 61% COMPARISON:  CT 11/28/2013 FINDINGS: Lower chest: Lung bases are clear. Hepatobiliary: Small nodular densities along the surface of the RIGHT hepatic lobe (image 28/2 image 33/2). Nodules measure from 3 to 5 mm are partially calcified. Sludge or stones in the gallbladder. Small amount pericholecystic fluid adjacent to fundus of the bladder. No biliary duct dilatation. The low several low-density cysts in the  liver. Pancreas: Pancreas is normal. No ductal dilatation. No pancreatic inflammation. Spleen: Normal spleen Adrenals/urinary tract: Adrenal glands are normal. Several intermediate density or enhancing lesions the renal cortex. For example 14 mm lesion along the lateral margin of theRIGHT lower pole (image 44/2). Cortical lesion measuring 18 mm on the anterior upper pole on the LEFT (image 24/2). There is mild hydroureter on the RIGHT hydroureter extends to the level of the pelvis. There is peritoneal nodularity in the pelvis mesentery measuring 18 mm appears to be fanning out in the colonic mesentery (image 75/2.) This may obstruct the ureter. Bladder is normal Stomach/Bowel: Stomach, duodenum and small bowel are normal. The ascending, transverse and descending colon are fluid-filled and without haustral markings. This fluid-filled colon extends the level sigmoid colon with there is caliber change. This is in the region of the mesenteric nodularity described renal section. The sigmoid colon is narrowed with potential calcified peritoneal densities (image 73/2). Rectum normal Vascular/Lymphatic: Abdominal aorta is normal caliber. No periportal or retroperitoneal adenopathy. No pelvic adenopathy. Reproductive: Uterus normal.  Ovaries not identified Other: There is a ventral hernia repair. There is enhancing nodularity along the ventral peritoneal surface with a frondlike appearance (image 40/2 for example). Small frondlike cluster measures 21 mm (image 40/2). This is similar to the mesenteric nodularity in the pelvis as well as the nodularity along the RIGHT hepatic lobe. Small calcified nodules also extends along the LEFT pericolic gutter. Musculoskeletal: No aggressive osseous lesion. IMPRESSION: 1. The ascending, transverse and descending colon is ahaustral, fluid-filled, and mildly distended to the level of sigmoid colon. A caliber change at sigmoid colon with serosal nodularity concerning for obstruction from  peritoneal carcinomatosis. 2. Nodular lesions which are partially calcified along the hepatic margin, pelvic sigmoid mesocolon pelvis as well as the ventral peritoneal space. 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 Partially calcified nodularity suggest potential ovarian source. The ventral midline peritoneal nodularity may most assessable for biopsy. 3. Mild entrapment of the distal RIGHT ureter associated with the mesenteric nodularity. 4. Indeterminate renal lesions. Recommend follow-up MRI renal protocol at some point. These results will be called to the ordering clinician or representative by the Radiologist Assistant, and communication documented in the PACS or zVision Dashboard. Electronically Signed   By: Suzy Bouchard M.D.   On: 08/22/2017 15:09   Dg Abd 2 Views  Result Date: 08/16/2017 CLINICAL DATA:  Bowel obstruction EXAM: ABDOMEN - 2 VIEW COMPARISON:  None. FINDINGS: Scattered gas containing small and large bowel loops without obstructive bowel gas pattern. Surgical tacks project over the mid abdomen presumably from ventral hernia repair. No free air, organomegaly nor suspicious radiopaque calculi. Lower lumbar facet arthropathy. IMPRESSION: Nonobstructed, nondistended bowel gas pattern. Electronically Signed   By: Ashley Royalty M.D.   On: 08/12/2017 19:55   Dg Abd Acute W/chest  Result Date: 08/26/2017 CLINICAL DATA:  Follow-up distal colonic obstruction. EXAM: DG ABDOMEN ACUTE W/ 1V CHEST COMPARISON:  August 25, 2017 FINDINGS: There is a small left effusion. The chest is otherwise stable. Lucency over the upper abdomen on the upright view may represent free postoperative air or air within the skin folder incision. Free air would be expected after surgery yesterday. The colonic loops are significantly improved with placement of a colonic tube. No other change. IMPRESSION: 1. A colonic tube is been placed with decompression of the colon. 2. Probable postoperative free air, as expected.  3. No other change. Electronically Signed   By: Dorise Bullion III M.D   On: 08/26/2017 08:33   Dg Abd Acute W/chest  Result Date: 09/01/2017 CLINICAL DATA:  Preoperative examination. EXAM: DG ABDOMEN ACUTE W/ 1V CHEST COMPARISON:  08/24/2017; 08/12/2017; CT abdomen pelvis-08/22/2017 FINDINGS: Grossly unchanged cardiac silhouette and mediastinal contours with atherosclerotic plaque within the thoracic aorta. Loop recorder overlies the mid heart. Suspected slight reduction in persistent small left-sided effusion. Improved aeration of lung bases with persistent bibasilar opacities, left greater than right. No new focal airspace opacities. Pulmonary venous congestion without frank evidence of edema. No pneumothorax. No acute osseus abnormalities. Read demonstrated patulous distension of multiple loops of colon with index loop of descending colon within the left mid hemiabdomen measuring approximately 5.6 cm in diameter, similar to the 08/24/2017 examination. No pneumoperitoneum, pneumatosis or portal venous gas No definitive abnormal intra-abdominal calcifications. IMPRESSION: 1. Slight reduction in persistent small left-sided effusion and improved aeration of bilateral lung bases with persistent bibasilar opacities, atelectasis versus infiltrate. 2. Similar findings worrisome for distal colonic obstruction as demonstrated on recent abdominal CT. Electronically Signed   By: Sandi Mariscal M.D.   On: 09/01/2017 08:42   Dg Abd Acute W/chest  Result Date: 08/24/2017 CLINICAL DATA:  Lower abdominal pain. EXAM: DG ABDOMEN ACUTE W/ 1V CHEST COMPARISON:  Chest and abdominal x-rays from yesterday. FINDINGS: Stable cardiomediastinal silhouette. Loop recorder again noted. Mild pulmonary vascular congestion, slightly more conspicuous on today's study. Unchanged small left pleural effusion and left basilar atelectasis. No pneumothorax. Stable mild dilatation of the colon. No pneumoperitoneum. No acute osseous abnormality.  IMPRESSION: 1. Stable mild colonic dilatation. 2. Unchanged small left pleural effusion and left basilar atelectasis. Electronically Signed   By: Titus Dubin M.D.   On: 08/24/2017 08:44   Dg Abd Acute W/chest  Result Date: 08/22/2017 CLINICAL DATA:  Left lower quadrant pain EXAM: DG ABDOMEN ACUTE W/ 1V CHEST COMPARISON:  08/22/2017 FINDINGS: Cardiac shadow is within normal limits. Loop recorder is noted. Left basilar atelectatic changes and small effusion are noted increased from the previous day. Right lung remains clear. No free air is noted. Mild dilatation of the colon is noted similar to that seen on the prior CT examination. Contrast is noted within the bladder. No bony abnormality is seen. IMPRESSION: Stable dilatation of the colon when compared with the prior exam. Increasing left basilar atelectasis and effusion. Electronically Signed   By: Inez Catalina M.D.   On: 08/20/2017 07:21   Dg Abd Portable 2v  Result Date: 08/29/2017 CLINICAL DATA:  Peritoneal carcinomatosis.  Colonic obstruction. EXAM: PORTABLE ABDOMEN - 2 VIEW COMPARISON:  Acute abdominal series 08/26/2017 and KUB 08/04/2017 FINDINGS: Examination demonstrates collection of air under the left hemidiaphragm on the upright film some which is intraluminal although it would be difficult to exclude free peritoneal air. Bowel gas pattern is otherwise nonobstructive. There are 2 catheters over the left lower  quadrant/pelvis. Skin staples over the soft tissues of the lateral left pelvis/lower abdomen. Remainder of the exam is unchanged. IMPRESSION: Nonobstructive bowel gas pattern. Collection of air below the left hemidiaphragm on the upright film likely intraluminal although cannot exclude a component of free peritoneal air. Recommend clinical correlation and consider left-side-down decubitus films to exclude free peritoneal air. Two catheters over the left lower quadrant/left pelvis. These results were called by telephone at the time of  interpretation on 08/29/2017 at 5:28 pm to patient's nurse Council Mechanic, who verbally acknowledged these results and will notify patient's physician. Electronically Signed   By: Marin Olp M.D.   On: 08/29/2017 17:28   Dg C-arm 1-60 Min-no Report  Result Date: 08/07/2017 Fluoroscopy was utilized by the requesting physician.  No radiographic interpretation.   [place] Ekg Site Rite  Result Date: 08/27/2017 If Site Rite image not attached, placement could not be confirmed due to current cardiac rhythm.   Consults: Treatment Team:  Jules Husbands, MD Efrain Sella, MD Earlie Server, MD Flora Lipps, MD Lucilla Lame, MD   Subjective:    Overnight Issues: patient had questionable free air on KUB, status post CT scan of abdomen, has been seen by surgery and gastroenterology.  Objective:  Vital signs for last 24 hours: Temp:  [98.2 F (36.8 C)-99.2 F (37.3 C)] 98.8 F (37.1 C) (06/28 0747) Pulse Rate:  [94-114] 107 (06/28 1100) Resp:  [12-28] 25 (06/28 1100) BP: (113-167)/(31-106) 158/106 (06/28 1100) SpO2:  [88 %-100 %] 97 % (06/28 1100) Weight:  [291 lb 14.2 oz (132.4 kg)] 291 lb 14.2 oz (132.4 kg) (06/28 0500)  Hemodynamic parameters for last 24 hours:    Intake/Output from previous day: 06/27 0701 - 06/28 0700 In: 1855.3 [I.V.:1855.3] Out: 66 [Urine:850; Drains:80]  Intake/Output this shift: Total I/O In: 498 [I.V.:498] Out: 225 [Urine:225]  Vent settings for last 24 hours:    Physical Exam:  Patient is awake, alert in no acute distress Vital signs: Please see the above listed vital signs Cardiovascular: Regular rate and rhythm Pulmonary: Clear to auscultation Abdominal: Soft exam, morbidly obese, wound VAC intact Extremities:  1+ edema is appreciated Neurologic: No focal deficits noted  Assessment/Plan:   Peritoneal carcinomatosis with large bowel obstruction. Patient is now status post colonic decompressive tube along with stenting of the colon.per surgery  patient will be started on a clear liquid diet.pending results of pathology and oncology consultation  Patient had free air noted last evening on KUB and CT scan. Felt to be postsurgical changes, being followed by surgery   Probable aspiration. Is being empirically started on Unasyn for both pulmonary and intra-abdominal source  Leukocytosis, has been cultured, started on antibiotic therapy  Anemia. No evidence of active bleeding  Non-anion gap metabolic acidosis  Hermelinda Dellen, DO  *Care during the described time interval was provided by me and/or other providers on the critical care team.  I have reviewed this patient's available data, including medical history, events of note, physical examination and test results as part of my evaluation.

## 2017-08-30 NOTE — Progress Notes (Signed)
[facility] Hematology/Oncology Progress Note  Date of admission: 08/31/2017  Hospital day:  08/30/2017  Chief Complaint: Alicia Conley is a 77 y.o. female who was admitted on 08/24/2017 with abdominal pain.  Subjective:  Abdominal discomfort/pain on left side.  Eating a little.  Talking some.  Social History: The patient is accompanied by her husband, daughter, and granddaughter today.  Allergies: No Known Allergies  Scheduled Medications: . aspirin EC  81 mg Oral Daily  . atorvastatin  80 mg Oral Daily  . chlorhexidine gluconate (MEDLINE KIT)  15 mL Mouth Rinse BID  . enoxaparin (LOVENOX) injection  40 mg Subcutaneous Q24H  . feeding supplement  1 Container Oral TID BM  . insulin aspart  0-9 Units Subcutaneous Q4H  . levothyroxine  75 mcg Oral QAC breakfast  . metoprolol tartrate  100 mg Oral BID  . sertraline  100 mg Oral Daily  . sodium chloride flush  10-40 mL Intracatheter Q12H    Review of Systems: GENERAL:  Fatigue.  No fevers, sweats.  Weight loss of 16 pounds. PERFORMANCE STATUS (ECOG):  4 HEENT:  No visual changes, runny nose, sore throat, mouth sores or tenderness. Lungs: Recent aspiration event.  Off ventilator.  No shortness of breath.  No hemoptysis. Cardiac:  No chest pain, palpitations, orthopnea, or PND. GI:  Abdominal pain (left side).  No current nausea, vomiting.  Flatus.  No bowel movement.  No melena or hematochezia. GU:  No urgency, frequency, dysuria, or hematuria. Musculoskeletal:  No back pain.  No joint pain.  No muscle tenderness. Extremities:  No pain or swelling. Skin:  No rashes or skin changes. Neuro:  General weakness.  No headache, numbness or weakness, balance or coordination issues. Endocrine:  No diabetes.  Thyroid disease on Synthroid.  Mo hot flashes or night sweats. Psych:  No mood changes, depression or anxiety. Pain:  No focal pain. Review of systems:  All other systems reviewed and found to be  negative.  Physical Exam: Blood pressure (!) 183/54, pulse (!) 120, temperature 98.7 F (37.1 C), temperature source Oral, resp. rate (!) 27, height 5' 5"  (1.651 m), weight 291 lb 14.2 oz (132.4 kg), SpO2 95 %.  GENERAL:  Chronically ill appearing heavyset woman lying comfortably on the medical unit in no acute distress. HEAD:  Short slightly discheveled gray hair.  Normocephalic, atraumatic, face symmetric, no Cushingoid features. EYES:  Pupils equal round.  No conjunctivitis or scleral icterus. ENT:  [place] in place.  Oropharynx clear without lesion.  Tongue normal. Mucous membranes moist.  RESPIRATORY:  Decreased respiratory excursion.  Clear to auscultation without rales, wheezes or rhonchi. CARDIOVASCULAR:  Regular rate and rhythm without murmur, rub or gallop. ABDOMEN:  Soft, slightly tender in left mid quadrant.  Bowel sounds.  No appreciable hepatosplenomegaly.  No palpable masses.  Drain in place. VAC in place. SKIN:  No rashes. EXTREMITIES: Chronic lower extremity changes.  No skin discoloration or tenderness.  No palpable cords. NEUROLOGICAL: Moves all 4 extremities.  Responds to direct questioning. PSYCH:  Sleepy.   Results for orders placed or performed during the hospital encounter of 08/14/2017 (from the past 48 hour(s))  Glucose, capillary     Status: Abnormal   Collection Time: 08/27/2017  8:49 PM  Result Value Ref Range   Glucose-Capillary 186 (H) 70 - 99 mg/dL  Glucose, capillary     Status: Abnormal   Collection Time: 08/29/17 12:08 AM  Result Value Ref Range   Glucose-Capillary 211 (H) 70 -  99 mg/dL  Glucose, capillary     Status: Abnormal   Collection Time: 08/29/17  5:03 AM  Result Value Ref Range   Glucose-Capillary 134 (H) 70 - 99 mg/dL  CBC     Status: Abnormal   Collection Time: 08/29/17  5:16 AM  Result Value Ref Range   WBC 17.4 (H) 3.6 - 11.0 K/uL   RBC 3.46 (L) 3.80 - 5.20 MIL/uL   Hemoglobin 10.0 (L) 12.0 - 16.0 g/dL   HCT 30.3 (L) 35.0 - 47.0 %   MCV  87.6 80.0 - 100.0 fL   MCH 28.8 26.0 - 34.0 pg   MCHC 32.9 32.0 - 36.0 g/dL   RDW 15.8 (H) 11.5 - 14.5 %   Platelets 346 150 - 440 K/uL    Comment: Performed at [facility], [address] [place], [place] [address]  Comprehensive metabolic panel     Status: Abnormal   Collection Time: 08/29/17  5:16 AM  Result Value Ref Range   Sodium 138 135 - 145 mmol/L   Potassium 4.2 3.5 - 5.1 mmol/L   Chloride 113 (H) 98 - 111 mmol/L    Comment: Please note change in reference range.   CO2 20 (L) 22 - 32 mmol/L   Glucose, Bld 155 (H) 70 - 99 mg/dL    Comment: Please note change in reference range.   BUN 32 (H) 8 - 23 mg/dL    Comment: Please note change in reference range.   Creatinine, Ser 1.15 (H) 0.44 - 1.00 mg/dL   Calcium 7.8 (L) 8.9 - 10.3 mg/dL   Total Protein 5.3 (L) 6.5 - 8.1 g/dL   Albumin 2.0 (L) 3.5 - 5.0 g/dL   AST 29 15 - 41 U/L   ALT 15 0 - 44 U/L    Comment: Please note change in reference range.   Alkaline Phosphatase 65 38 - 126 U/L   Total Bilirubin 0.4 0.3 - 1.2 mg/dL   GFR calc non Af Amer 45 (L) >60 mL/min   GFR calc Af Amer 52 (L) >60 mL/min    Comment: (NOTE) The eGFR has been calculated using the CKD EPI equation. This calculation has not been validated in all clinical situations. eGFR's persistently <60 mL/min signify possible Chronic Kidney Disease.    Anion gap 5 5 - 15    Comment: Performed at [facility], [address] [place], [place] [address]  Magnesium     Status: None   Collection Time: 08/29/17  5:16 AM  Result Value Ref Range   Magnesium 2.0 1.7 - 2.4 mg/dL    Comment: Performed at [facility], [address] [place], [place] [address]  Phosphorus     Status: Abnormal   Collection Time: 08/29/17  5:16 AM  Result Value Ref Range   Phosphorus 2.0 (L) 2.5 - 4.6 mg/dL    Comment: Performed at [facility], [address] [place], [place] [address]  Blood gas, arterial     Status: Abnormal    Collection Time: 08/29/17 11:30 AM  Result Value Ref Range   FIO2 40.00    Delivery systems VENTILATOR    Mode PRESSURE SUPPORT    Peep/cpap 3.0 cm H20   Pressure support 5 cm H20   pH, Arterial 7.28 (L) 7.350 - 7.450   pCO2 arterial 45 32.0 - 48.0 mmHg   pO2, Arterial 91 83.0 - 108.0 mmHg   Bicarbonate 21.1 20.0 - 28.0 mmol/L   Acid-base deficit 5.5 (  H) 0.0 - 2.0 mmol/L   O2 Saturation 95.9 %   Patient temperature 37.0    Collection site LEFT BRACHIAL    Sample type ARTERIAL DRAW    Allens test (pass/fail) PASS PASS    Comment: Performed at [facility], [address] [place], [place] [address]  Glucose, capillary     Status: Abnormal   Collection Time: 08/29/17  7:04 PM  Result Value Ref Range   Glucose-Capillary 116 (H) 70 - 99 mg/dL  Glucose, capillary     Status: Abnormal   Collection Time: 08/29/17  7:58 PM  Result Value Ref Range   Glucose-Capillary 111 (H) 70 - 99 mg/dL  Glucose, capillary     Status: Abnormal   Collection Time: 08/30/17 12:26 AM  Result Value Ref Range   Glucose-Capillary 146 (H) 70 - 99 mg/dL  Glucose, capillary     Status: Abnormal   Collection Time: 08/30/17  3:41 AM  Result Value Ref Range   Glucose-Capillary 155 (H) 70 - 99 mg/dL  CBC     Status: Abnormal   Collection Time: 08/30/17  6:05 AM  Result Value Ref Range   WBC 19.2 (H) 3.6 - 11.0 K/uL   RBC 3.67 (L) 3.80 - 5.20 MIL/uL   Hemoglobin 10.5 (L) 12.0 - 16.0 g/dL   HCT 32.4 (L) 35.0 - 47.0 %   MCV 88.4 80.0 - 100.0 fL   MCH 28.6 26.0 - 34.0 pg   MCHC 32.4 32.0 - 36.0 g/dL   RDW 16.1 (H) 11.5 - 14.5 %   Platelets 378 150 - 440 K/uL    Comment: Performed at [facility], [address], [place], [place] [address]  Basic metabolic panel     Status: Abnormal   Collection Time: 08/30/17  6:05 AM  Result Value Ref Range   Sodium 138 135 - 145 mmol/L   Potassium 4.2 3.5 - 5.1 mmol/L   Chloride 111 98 - 111 mmol/L    Comment: Please note change in reference range.    CO2 18 (L) 22 - 32 mmol/L   Glucose, Bld 155 (H) 70 - 99 mg/dL    Comment: Please note change in reference range.   BUN 45 (H) 8 - 23 mg/dL    Comment: Please note change in reference range.   Creatinine, Ser 1.20 (H) 0.44 - 1.00 mg/dL   Calcium 8.0 (L) 8.9 - 10.3 mg/dL   GFR calc non Af Amer 43 (L) >60 mL/min   GFR calc Af Amer 50 (L) >60 mL/min    Comment: (NOTE) The eGFR has been calculated using the CKD EPI equation. This calculation has not been validated in all clinical situations. eGFR's persistently <60 mL/min signify possible Chronic Kidney Disease.    Anion gap 9 5 - 15    Comment: Performed at [facility], [address] [place], [place] [address]  Magnesium     Status: None   Collection Time: 08/30/17  6:05 AM  Result Value Ref Range   Magnesium 2.2 1.7 - 2.4 mg/dL    Comment: Performed at [facility], [address] [place], [place] [address]  Phosphorus     Status: Abnormal   Collection Time: 08/30/17  6:05 AM  Result Value Ref Range   Phosphorus 4.7 (H) 2.5 - 4.6 mg/dL    Comment: Performed at [facility], [address] [place], [place] [address]  Glucose, capillary     Status: Abnormal   Collection Time:  08/30/17  7:55 AM  Result Value Ref Range   Glucose-Capillary 152 (H) 70 - 99 mg/dL  Glucose, capillary     Status: Abnormal   Collection Time: 08/30/17 11:46 AM  Result Value Ref Range   Glucose-Capillary 147 (H) 70 - 99 mg/dL  Glucose, capillary     Status: Abnormal   Collection Time: 08/30/17  4:35 PM  Result Value Ref Range   Glucose-Capillary 135 (H) 70 - 99 mg/dL  Glucose, capillary     Status: Abnormal   Collection Time: 08/30/17  8:04 PM  Result Value Ref Range   Glucose-Capillary 139 (H) 70 - 99 mg/dL   Ct Abdomen Pelvis Wo Contrast  Result Date: 08/29/2017 CLINICAL DATA:  Abdominal xray revealed collection of air below the left hemidiaphragm likely intraluminal, however unable to exclude a component of  free peritoneal air radiologist recommended CT Abd Pelvis. Patient has abdominal pain. Medical record history reports a laparotomy on 08/29/2017. EXAM: CT ABDOMEN AND PELVIS WITHOUT CONTRAST TECHNIQUE: Multidetector CT imaging of the abdomen and pelvis was performed following the standard protocol without IV contrast. COMPARISON:  Current abdomen radiographs. Prior CT dated 08/22/2017. FINDINGS: Lower chest: Small pleural effusions. There is dependent lung base opacity, left greater than right, consistent with atelectasis. Heart is normal in size. Hepatobiliary: There are 4 low-density liver masses consistent with cysts, largest from the posterior segment of the right lobe measuring 5 cm. No other liver abnormalities. The gallbladder is distended. There is dependent density in the gallbladder consistent with sludge, stones or a combination. No wall thickening. No bile duct dilation. Pancreas: Unremarkable. No pancreatic ductal dilatation or surrounding inflammatory changes. Spleen: Normal in size without focal abnormality. Adrenals/Urinary Tract: No adrenal masses. Mild to moderate right hydronephrosis and hydroureter. Right ureter is dilated into the pelvis. It is decompressed beginning just above the posterior bladder. No left hydronephrosis. Left ureter is normal course and in caliber. No intrarenal stones. Hyperattenuating mass, average Hounsfield units of 39, arises from the upper pole the right kidney, likely a cyst complicated by hemorrhage or proteinaceous contents. It measures 15 mm. There is a smaller more subtle similar-appearing mass from the medial upper pole the left kidney. No other renal masses. Bladder is decompressed by Foley catheter. Stomach/Bowel: The distal sigmoid colon is irregular with adjacent nodular opacities, consistent with colon carcinoma. Above this irregular area of sigmoid colon lies a stent which extends from the mid sigmoid colon to the lower descending colon. There is a  percutaneously placed 2 at enters the left mid abdomen. The tip of this 2 has a configuration consistent with a gastrostomy tube. However, this lies in the:. There is significant subcutaneous air adjacent to the tube in the left mid abdomen lateral to the umbilicus. This portion of the colon is mostly decompressed. Right colon is normal in caliber. An no inflammatory changes. Stomach is mild-to-moderately distended. There is dependent fluid with some dependent contrast. No wall thickening or adjacent inflammation. Small bowel is normal in caliber. There is free intraperitoneal air that is collecting along the anterior mid to upper abdomen, accounting for the abnormality noted on the current radiographs. A trace amount of dense material lies adjacent to the spleen where it is in close proximity to the gastric fundus. Other small foci of dense material lie along the surface of the right liver lobe. These findings were present on the CT from 08/22/2017 and suggest peritoneal carcinomatosis. No ascites. Vascular/Lymphatic: No pathologically enlarged lymph nodes. Aortic atherosclerosis. Reproductive: Uterus is  normal size and configuration. No ovarian/adnexal masses. Other: There is postsurgical scarring with apparent hernia mesh along the anterior abdominal midline stable from the prior CT. A surgical drainage catheter enters the left lower quadrant. Musculoskeletal: No acute fracture or acute finding. 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 No osteoblastic or osteolytic lesions. IMPRESSION: 1. There is free intraperitoneal air. This is greater than expected 4 days post laparotomy, but still could be postsurgical in origin. 2. There is no definite source for this air. However, there is a tube that has the appearance of a gastrostomy tube. This enters the left transverse colon. This is likely a colonic tube for decompression. There is air surrounding the tube in the subcutaneous soft  tissues at its insertion site. This is a possible source of an air leak. 3. 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 4. There is mild to moderate right hydronephrosis and right hydroureter. The partial obstruction is at the level of the neoplastic changes around the sigmoid colon. This is also stable from prior CT. Electronically Signed   By: Lajean Manes M.D.   On: 08/29/2017 20:59   Dg Abd Portable 2v  Result Date: 08/29/2017 CLINICAL DATA:  Peritoneal carcinomatosis.  Colonic obstruction. EXAM: PORTABLE ABDOMEN - 2 VIEW COMPARISON:  Acute abdominal series 08/26/2017 and KUB 08/16/2017 FINDINGS: Examination demonstrates collection of air under the left hemidiaphragm on the upright film some which is intraluminal although it would be difficult to exclude free peritoneal air. Bowel gas pattern is otherwise nonobstructive. There are 2 catheters over the left lower quadrant/pelvis. Skin staples over the soft tissues of the lateral left pelvis/lower abdomen. Remainder of the exam is unchanged. IMPRESSION: Nonobstructive bowel gas pattern. Collection of air below the left hemidiaphragm on the upright film likely intraluminal although cannot exclude a component of free peritoneal air. Recommend clinical correlation and consider left-side-down decubitus films to exclude free peritoneal air. Two catheters over the left lower quadrant/left pelvis. These results were called by telephone at the time of interpretation on 08/29/2017 at 5:28 pm to patient's nurse Council Mechanic, who verbally acknowledged these results and will notify patient's physician. Electronically Signed   By: Marin Olp M.D.   On: 08/29/2017 17:28    Assessment:  Alicia Conley is a 77 y.o. 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  Pathology reveals metastatic carcinoma.   Immunohistochemical stains was positive for PAX 8 and cytokeratin 7. CDX 2, GATA 3, and cytokeratin 20 were negative. Differential includes metastasis from a gynecologic primary (ovarian) and intestinal primary (neuroendocrine tumor). She has a history of bilateral oophorectomy (2004).  Additional  immunohistochemical stains are pending.  Abdominal and pelvic CT on 08/22/2017 revealed fluid filled ascending, transverse and descending colon without haustral markings to the level of the sigmoid colon with associated serosal nodularity concerning for obstruction from peritoneal carcinomatosis.  There are nodular lesions which are partially calcified along the hepatic margin, pelvic sigmoid mesocolon pelvis as well as the ventral peritoneal space concerning for peritoneal carcinomatosis.  There is mild entrapment of the distal RIGHT ureter associated with the mesenteric nodularity.  There are indeterminate renal lesions.  She underwent colonic stent placement x 2 on 08/13/2017.  Course was complicated by aspiration requiring intubation and ventilation.  She was extubated yesterday.  She is s/p bilateral oophorectomy in 2004.  She denies any family history  of ovarian or breast cancer.  CA125 was 93.8 and CEA 2.8 on 08/09/2017.  Symptomatically, she remains weak.  Performance status is poor.  Plan: 1.  Oncology:  Patient presented with a 3 week history of constipation.  Abdomen and pelvic CT at [facility] on 08/16/2017 revealed impacted stool and thickening in the sigmoid colon.  She presented with abdominal pain to [facility].  She underwent laparotomy with peritoneal biopsies on 08/13/2017.  She was described as having a frozen abdomen with massive and multiple peritoneal implants.  The colon was unable to be mobilized secondary to carcinomatosis.  Pathology reveals metastatic carcinoma, likely of GYN origin.  As patient previously had a bilateral oophorectomy, suspect primary peritoneal carcinomatosis.   Differential includes intestinal primary (neuroendocrine tumor).   Additional IHC stains pending.  Discussed with patient and family differences in palliative treatment.  Patient's current performance status limits treatment at the present time.  Several questions asked and answered.  2.  Gastroenterology:  Patient on TPN.  Patient had a few sips today.  s/p colonic stent placement x 2.  Currently no stool output.  3.  Infectious disease:  s/p aspiration event post stent placement.  Patient on Unasyn.  4.  Pulmonary:  s/p intubation after aspiration event.  Now on nasal cannula.  5.  Disposition:  Guarded.    Lequita Asal, MD  08/30/2017, 8:37 PM

## 2017-08-30 NOTE — Progress Notes (Signed)
Pharmacy Antibiotic Note  Alicia Conley is a 77 y.o. female admitted on 08/10/2017 with probable aspiration pneumonia and possible intra-abdominal infection.  Pharmacy has been consulted for Unasyn dosing.  Plan: Per AM ICU rounds, initiate Unasyn 3g IV q6h at 1045 on 6/28.   Height: 5\' 5"  (165.1 cm) Weight: 291 lb 14.2 oz (132.4 kg) IBW/kg (Calculated) : 57  Temp (24hrs), Avg:98.8 F (37.1 C), Min:98.2 F (36.8 C), Max:99.2 F (37.3 C)  Recent Labs  Lab 08/26/17 0457 08/27/17 0420 08/22/2017 0414 08/24/2017 0418 08/29/17 0516 08/30/17 0605  WBC 20.1* 14.8* 16.6*  --  17.4* 19.2*  CREATININE 1.16* 1.46*  --  1.59* 1.15* 1.20*    Estimated Creatinine Clearance: 54.9 mL/min (A) (by C-G formula based on SCr of 1.2 mg/dL (H)).    No Known Allergies  Antimicrobials this admission: 6/22 cefoxitin >> 6/26 6/23 cefazolin x1 6/23 Zosyn >> 6/23 6/28 Unasyn >>  Microbiology results: 6/20 GI Panel PCR: negative 6/20 Cdiff: negative 6/20 MRSA PCR: sent  Thank you for allowing pharmacy to be a part of this patient's care.  Tyson Babinski 08/30/2017 1:46 PM

## 2017-08-30 NOTE — Progress Notes (Signed)
PT Cancellation Note  Patient Details Name: Alicia Conley MRN: [id number] DOB: January 18, 1941   Cancelled Treatment:    Reason Eval/Treat Not Completed: (Consult received and chart reviewed. Per primary RN, difficulty controlling pain this date; patient unable to fully participate with mobility assessment as result.  Recommends hold with re-attempt next date as medically appropriate.)   Oliverio Cho H. Owens Shark, PT, DPT, NCS 08/30/17, 3:18 PM [phone]

## 2017-08-31 LAB — BASIC METABOLIC PANEL
ANION GAP: 9 (ref 5–15)
BUN: 48 mg/dL — ABNORMAL HIGH (ref 8–23)
CALCIUM: 8 mg/dL — AB (ref 8.9–10.3)
CHLORIDE: 111 mmol/L (ref 98–111)
CO2: 20 mmol/L — AB (ref 22–32)
Creatinine, Ser: 1.14 mg/dL — ABNORMAL HIGH (ref 0.44–1.00)
GFR calc non Af Amer: 46 mL/min — ABNORMAL LOW (ref >60)
GFR, EST AFRICAN AMERICAN: 53 mL/min — AB (ref >60)
Glucose, Bld: 150 mg/dL — ABNORMAL HIGH (ref 70–99)
Potassium: 3.9 mmol/L (ref 3.5–5.1)
SODIUM: 140 mmol/L (ref 135–145)

## 2017-08-31 LAB — GLUCOSE, CAPILLARY
GLUCOSE-CAPILLARY: 126 mg/dL — AB (ref 70–99)
GLUCOSE-CAPILLARY: 130 mg/dL — AB (ref 70–99)
Glucose-Capillary: 142 mg/dL — ABNORMAL HIGH (ref 70–99)
Glucose-Capillary: 145 mg/dL — ABNORMAL HIGH (ref 70–99)
Glucose-Capillary: 134 mg/dL — ABNORMAL HIGH (ref 70–99)

## 2017-08-31 LAB — MRSA PCR SCREENING: MRSA by PCR: NEGATIVE

## 2017-08-31 LAB — PHOSPHORUS: PHOSPHORUS: 4.3 mg/dL (ref 2.5–4.6)

## 2017-08-31 LAB — MAGNESIUM: Magnesium: 2.1 mg/dL (ref 1.7–2.4)

## 2017-08-31 MED ORDER — TRACE MINERALS CR-CU-MN-SE-ZN 10-1000-500-60 MCG/ML IV SOLN
INTRAVENOUS | Status: AC
  Administered 2017-08-31 (×2): via INTRAVENOUS
  Filled 2017-08-31 (×3): qty 1992

## 2017-08-31 MED ORDER — ACETAMINOPHEN 500 MG PO TABS
1000.0000 mg | ORAL_TABLET | Freq: Four times a day (QID) | ORAL | Status: DC
  Administered 2017-08-31 – 2017-09-06 (×15): 1000 mg via ORAL
  Filled 2017-08-31 (×20): qty 2

## 2017-08-31 MED ORDER — FAT EMULSION PLANT BASED 20 % IV EMUL
250.0000 mL | INTRAVENOUS | Status: AC
  Administered 2017-08-31: 250 mL via INTRAVENOUS
  Filled 2017-08-31 (×2): qty 250

## 2017-08-31 MED ORDER — VANCOMYCIN HCL 10 G IV SOLR
1500.0000 mg | INTRAVENOUS | Status: DC
  Administered 2017-08-31 – 2017-09-02 (×2): 1500 mg via INTRAVENOUS
  Filled 2017-08-31 (×4): qty 1500

## 2017-08-31 MED ORDER — VANCOMYCIN HCL IN DEXTROSE 1-5 GM/200ML-% IV SOLN
1000.0000 mg | Freq: Once | INTRAVENOUS | Status: AC
  Administered 2017-08-31: 1000 mg via INTRAVENOUS
  Filled 2017-08-31: qty 200

## 2017-08-31 MED ORDER — KETOROLAC TROMETHAMINE 30 MG/ML IJ SOLN
15.0000 mg | Freq: Four times a day (QID) | INTRAMUSCULAR | Status: DC
  Administered 2017-08-31 – 2017-09-02 (×8): 15 mg via INTRAVENOUS
  Filled 2017-08-31 (×9): qty 1

## 2017-08-31 MED ORDER — FAMOTIDINE IN NACL 20-0.9 MG/50ML-% IV SOLN
20.0000 mg | Freq: Two times a day (BID) | INTRAVENOUS | Status: DC
  Administered 2017-08-31 – 2017-09-02 (×4): 20 mg via INTRAVENOUS
  Filled 2017-08-31 (×7): qty 50

## 2017-08-31 NOTE — Progress Notes (Signed)
Pharmacy Antibiotic Note  Alicia Conley is a 77 y.o. female admitted on 09/01/2017 with probable aspiration pneumonia and possible intra-abdominal infection.  Pharmacy has been consulted for Unasyn dosing.  Plan: Per AM ICU rounds, initiate Unasyn 3g IV q6h at 1045 on 6/28.   Height: 5\' 5"  (165.1 cm) Weight: 291 lb 0.1 oz (132 kg) IBW/kg (Calculated) : 57  Temp (24hrs), Avg:98.8 F (37.1 C), Min:98.3 F (36.8 C), Max:99.2 F (37.3 C)  Recent Labs  Lab 08/26/17 0457 08/27/17 0420 08/22/2017 0414 08/03/2017 0418 08/29/17 0516 08/30/17 0605 08/31/17 0617  WBC 20.1* 14.8* 16.6*  --  17.4* 19.2*  --   CREATININE 1.16* 1.46*  --  1.59* 1.15* 1.20* 1.14*    Estimated Creatinine Clearance: 57.7 mL/min (A) (by C-G formula based on SCr of 1.14 mg/dL (H)).    No Known Allergies  Antimicrobials this admission: 6/22 cefoxitin >> 6/26 6/23 cefazolin x1 6/23 Zosyn >> 6/23 6/28 Unasyn >>  Microbiology results: 6/20 GI Panel PCR: negative 6/20 Cdiff: negative 6/20 MRSA PCR: sent  Thank you for allowing pharmacy to be a part of this patient's care.  Samora Jernberg A 08/31/2017 9:23 AM

## 2017-08-31 NOTE — Progress Notes (Signed)
Follow up - Critical Care Medicine Note  Patient Details:    Alicia Conley is an 77 y.o. female.with distal large bowel obstruction attributed to external compression by extensive diffuse peritoneal carcinomatosis Post-Ops/p laparotomy with unsuccessful attempted colostomy instead with decompressing colon-drainage tube, complicated bypost-surgical leukocytosis and bycomorbidities includingmorbid obesity  Lines, Airways, Drains: Airway 7 mm (Active)  Secured at (cm) 23 cm 08/29/2017  8:02 AM  Measured From Lips 08/29/2017  8:02 AM  Secured Location Right 08/29/2017  8:02 AM  Secured By [employer] 08/29/2017  8:02 AM  Tube Holder Repositioned Yes 08/29/2017  4:03 AM  Cuff Pressure (cm H2O) 24 cm H2O 08/29/2017  8:02 AM  Site Condition Dry 08/29/2017  4:03 AM     PICC Double Lumen 08/27/17 PICC Right Cephalic 43 cm 0 cm (Active)  Indication for Insertion or Continuance of Line Administration of hyperosmolar/irritating solutions (i.e. TPN, Vancomycin, etc.) 08/30/2017  8:00 AM  Exposed Catheter (cm) 0 cm 08/27/2017  3:00 PM  Site Assessment Clean;Dry;Intact 08/22/2017  7:53 PM  Lumen #1 Status Infusing 08/20/2017  7:53 PM  Lumen #2 Status Infusing 08/13/2017  7:53 PM  Dressing Type Transparent;Securing device 08/31/2017  7:53 PM  Dressing Status Clean;Dry;Intact;Antimicrobial disc in place 08/19/2017  7:53 PM  Dressing Change Due 08/04/17 08/11/2017  7:53 PM     Closed System Drain Other (Comment);Left LLQ Bulb (JP) (Active)  Site Description Unremarkable 08/27/2017  8:00 PM  Dressing Status Clean;Dry;Intact 08/30/2017  4:00 AM  Drainage Appearance Serosanguineous 08/29/2017 12:30 AM  Status To suction (Charged) 08/29/2017 12:30 AM  Intake (mL) 90 ml 08/05/2017  8:47 AM  Output (mL) 10 mL 08/30/2017  5:00 AM     Open Drain 1 Left;Midline;Lateral Abdomen  (Active)  Site Description Unable to view 08/30/2017  7:30 PM  Dressing Status Clean;Dry 08/12/2017  7:30 PM  Drainage Appearance  Brown 08/27/2017  8:00 AM  Status Unclamped 08/27/2017  8:00 AM  Output (mL) 0 mL 08/11/2017  7:30 PM     Negative Pressure Wound Therapy Abdomen Mid (Active)  Site / Wound Assessment Clean;Dry 08/30/2017  4:00 AM  Peri-wound Assessment Intact 08/13/2017  7:30 PM  Output (mL) 0 mL 08/29/2017  6:00 PM     Urethral Catheter K Somers RN Latex 16 Fr. (Active)  Indication for Insertion or Continuance of Catheter Unstable critical patients (first 24-48 hours) 08/30/2017  4:00 AM  Site Assessment Clean 08/30/2017  4:00 AM  Catheter Maintenance Bag below level of bladder;Catheter secured;Drainage bag/tubing not touching floor;Insertion date on drainage bag;No dependent loops;Seal intact 08/30/2017  8:00 AM  Collection Container Standard drainage bag 08/30/2017  4:00 AM  Securement Method Securing device (Describe) 08/30/2017  4:00 AM  Output (mL) 225 mL 08/30/2017  7:47 AM    Anti-infectives:  Anti-infectives (From admission, onward)   Start     Dose/Rate Route Frequency Ordered Stop   08/30/17 1045  Ampicillin-Sulbactam (UNASYN) 3 g in sodium chloride 0.9 % 100 mL IVPB     3 g 200 mL/hr over 30 Minutes Intravenous Every 6 hours 08/30/17 1034     08/12/2017 1800  piperacillin-tazobactam (ZOSYN) IVPB 3.375 g  Status:  Discontinued     3.375 g 12.5 mL/hr over 240 Minutes Intravenous Every 8 hours 08/18/2017 1637 08/26/17 0126   08/07/2017 1730  piperacillin-tazobactam (ZOSYN) IVPB 3.375 g  Status:  Discontinued     3.375 g 12.5 mL/hr over 240 Minutes Intravenous Every 8 hours 08/24/2017 1728 08/26/17 0136   08/13/2017 1445  piperacillin-tazobactam (ZOSYN) IVPB 3.375 g     3.375 g 100 mL/hr over 30 Minutes Intravenous  Once 08/10/2017 1437 08/27/2017 1516   08/24/17 1800  cefOXitin (MEFOXIN) 2 g in sodium chloride 0.9 % 100 mL IVPB  Status:  Discontinued     2 g 200 mL/hr over 30 Minutes Intravenous Every 6 hours 08/24/17 1606 08/22/2017 0844      Microbiology: Results for orders placed or performed during the  hospital encounter of 08/10/2017  C difficile quick scan w PCR reflex     Status: None   Collection Time: 08/22/17  4:43 PM  Result Value Ref Range Status   C Diff antigen NEGATIVE NEGATIVE Final   C Diff toxin NEGATIVE NEGATIVE Final   C Diff interpretation No C. difficile detected.  Final    Comment: Performed at [facility], [address] [place], [place] [address]  Gastrointestinal Panel by PCR , Stool     Status: None   Collection Time: 08/22/17  4:43 PM  Result Value Ref Range Status   Campylobacter species NOT DETECTED NOT DETECTED Final   Plesimonas shigelloides NOT DETECTED NOT DETECTED Final   Salmonella species NOT DETECTED NOT DETECTED Final   Yersinia enterocolitica NOT DETECTED NOT DETECTED Final   Vibrio species NOT DETECTED NOT DETECTED Final   Vibrio cholerae NOT DETECTED NOT DETECTED Final   Enteroaggregative E coli (EAEC) NOT DETECTED NOT DETECTED Final   Enteropathogenic E coli (EPEC) NOT DETECTED NOT DETECTED Final   Enterotoxigenic E coli (ETEC) NOT DETECTED NOT DETECTED Final   Shiga like toxin producing E coli (STEC) NOT DETECTED NOT DETECTED Final   Shigella/Enteroinvasive E coli (EIEC) NOT DETECTED NOT DETECTED Final   Cryptosporidium NOT DETECTED NOT DETECTED Final   Cyclospora cayetanensis NOT DETECTED NOT DETECTED Final   Entamoeba histolytica NOT DETECTED NOT DETECTED Final   Giardia lamblia NOT DETECTED NOT DETECTED Final   Adenovirus F40/41 NOT DETECTED NOT DETECTED Final   Astrovirus NOT DETECTED NOT DETECTED Final   Norovirus GI/GII NOT DETECTED NOT DETECTED Final   Rotavirus A NOT DETECTED NOT DETECTED Final   Sapovirus (I, II, IV, and V) NOT DETECTED NOT DETECTED Final    Comment: Performed at [facility], [address] [place], [place] [address]  Studies: Ct Abdomen Pelvis Wo Contrast  Result Date: 08/29/2017 CLINICAL DATA:  Abdominal xray revealed collection of air below the left hemidiaphragm likely intraluminal,  however unable to exclude a component of free peritoneal air radiologist recommended CT Abd Pelvis. Patient has abdominal pain. Medical record history reports a laparotomy on 08/24/2017. EXAM: CT ABDOMEN AND PELVIS WITHOUT CONTRAST TECHNIQUE: Multidetector CT imaging of the abdomen and pelvis was performed following the standard protocol without IV contrast. COMPARISON:  Current abdomen radiographs. Prior CT dated 08/22/2017. FINDINGS: Lower chest: Small pleural effusions. There is dependent lung base opacity, left greater than right, consistent with atelectasis. Heart is normal in size. Hepatobiliary: There are 4 low-density liver masses consistent with cysts, largest from the posterior segment of the right lobe measuring 5 cm. No other liver abnormalities. The gallbladder is distended. There is dependent density in the gallbladder consistent with sludge, stones or a combination. No wall thickening. No bile duct dilation. Pancreas: Unremarkable. No pancreatic ductal dilatation or surrounding inflammatory changes. Spleen: Normal in size without focal abnormality. Adrenals/Urinary Tract: No adrenal masses. Mild to moderate right hydronephrosis and hydroureter. Right ureter is dilated into the pelvis. It is decompressed beginning just above the posterior bladder. No  left hydronephrosis. Left ureter is normal course and in caliber. No intrarenal stones. Hyperattenuating mass, average Hounsfield units of 39, arises from the upper pole the right kidney, likely a cyst complicated by hemorrhage or proteinaceous contents. It measures 15 mm. There is a smaller more subtle similar-appearing mass from the medial upper pole the left kidney. No other renal masses. Bladder is decompressed by Foley catheter. Stomach/Bowel: The distal sigmoid colon is irregular with adjacent nodular opacities, consistent with colon carcinoma. Above this irregular area of sigmoid colon lies a stent which extends from the mid sigmoid colon to the  lower descending colon. There is a percutaneously placed 2 at enters the left mid abdomen. The tip of this 2 has a configuration consistent with a gastrostomy tube. However, this lies in the:. There is significant subcutaneous air adjacent to the tube in the left mid abdomen lateral to the umbilicus. This portion of the colon is mostly decompressed. Right colon is normal in caliber. An no inflammatory changes. Stomach is mild-to-moderately distended. There is dependent fluid with some dependent contrast. No wall thickening or adjacent inflammation. Small bowel is normal in caliber. There is free intraperitoneal air that is collecting along the anterior mid to upper abdomen, accounting for the abnormality noted on the current radiographs. A trace amount of dense material lies adjacent to the spleen where it is in close proximity to the gastric fundus. Other small foci of dense material lie along the surface of the right liver lobe. These findings were present on the CT from 08/22/2017 and suggest peritoneal carcinomatosis. No ascites. Vascular/Lymphatic: No pathologically enlarged lymph nodes. Aortic atherosclerosis. Reproductive: Uterus is normal size and configuration. No ovarian/adnexal masses. Other: There is postsurgical scarring with apparent hernia mesh along the anterior abdominal midline stable from the prior CT. A surgical drainage catheter enters the left lower quadrant. Musculoskeletal: No acute fracture or acute finding. 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 No osteoblastic or osteolytic lesions. IMPRESSION: 1. There is free intraperitoneal air. This is greater than expected 4 days post laparotomy, but still could be postsurgical in origin. 2. There is no definite source for this air. However, there is a tube that has the appearance of a gastrostomy tube. This enters the left transverse colon. This is likely a colonic tube for decompression. There is air surrounding  the tube in the subcutaneous soft tissues at its insertion site. This is a possible source of an air leak. 3. 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 4. There is mild to moderate right hydronephrosis and right hydroureter. The partial obstruction is at the level of the neoplastic changes around the sigmoid colon. This is also stable from prior CT. Electronically Signed   By: Lajean Manes M.D.   On: 08/29/2017 20:59   Dg Abd 1 View  Result Date: 08/30/2017 CLINICAL DATA:  Initial evaluation for enteric tube placement. EXAM: ABDOMEN - 1 VIEW COMPARISON:  None. FINDINGS: Enteric tube in place with tip in side hole seen overlying the stomach, well beyond the GE junction. Tip projects distally. Visualized bowel gas pattern within normal limits. IMPRESSION: Enteric tube in place with tip and side hole overlying the stomach, well beyond the GE junction. Electronically Signed   By: Jeannine Boga M.D.   On: 08/24/2017 20:20   Ct Abdomen Pelvis W Contrast  Result Date: 08/22/2017 CLINICAL DATA:  Follow-up CT scan concerning  for bowel obstruction. LEFT lower quadrant pain abdominal pain for 2 weeks. Last bowel movement 1 week ago. EXAM: CT ABDOMEN AND PELVIS WITH CONTRAST TECHNIQUE: Multidetector CT imaging of the abdomen and pelvis was performed using the standard protocol following bolus administration of intravenous contrast. CONTRAST:  75mL ISOVUE-300 IOPAMIDOL (ISOVUE-300) INJECTION 61% COMPARISON:  CT 11/28/2013 FINDINGS: Lower chest: Lung bases are clear. Hepatobiliary: Small nodular densities along the surface of the RIGHT hepatic lobe (image 28/2 image 33/2). Nodules measure from 3 to 5 mm are partially calcified. Sludge or stones in the gallbladder. Small amount pericholecystic fluid adjacent to fundus of the bladder. No biliary duct dilatation. The low several low-density cysts in the  liver. Pancreas: Pancreas is normal. No ductal dilatation. No pancreatic inflammation. Spleen: Normal spleen Adrenals/urinary tract: Adrenal glands are normal. Several intermediate density or enhancing lesions the renal cortex. For example 14 mm lesion along the lateral margin of theRIGHT lower pole (image 44/2). Cortical lesion measuring 18 mm on the anterior upper pole on the LEFT (image 24/2). There is mild hydroureter on the RIGHT hydroureter extends to the level of the pelvis. There is peritoneal nodularity in the pelvis mesentery measuring 18 mm appears to be fanning out in the colonic mesentery (image 75/2.) This may obstruct the ureter. Bladder is normal Stomach/Bowel: Stomach, duodenum and small bowel are normal. The ascending, transverse and descending colon are fluid-filled and without haustral markings. This fluid-filled colon extends the level sigmoid colon with there is caliber change. This is in the region of the mesenteric nodularity described renal section. The sigmoid colon is narrowed with potential calcified peritoneal densities (image 73/2). Rectum normal Vascular/Lymphatic: Abdominal aorta is normal caliber. No periportal or retroperitoneal adenopathy. No pelvic adenopathy. Reproductive: Uterus normal.  Ovaries not identified Other: There is a ventral hernia repair. There is enhancing nodularity along the ventral peritoneal surface with a frondlike appearance (image 40/2 for example). Small frondlike cluster measures 21 mm (image 40/2). This is similar to the mesenteric nodularity in the pelvis as well as the nodularity along the RIGHT hepatic lobe. Small calcified nodules also extends along the LEFT pericolic gutter. Musculoskeletal: No aggressive osseous lesion. IMPRESSION: 1. The ascending, transverse and descending colon is ahaustral, fluid-filled, and mildly distended to the level of sigmoid colon. A caliber change at sigmoid colon with serosal nodularity concerning for obstruction from  peritoneal carcinomatosis. 2. Nodular lesions which are partially calcified along the hepatic margin, pelvic sigmoid mesocolon pelvis as well as the ventral peritoneal space. 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 Partially calcified nodularity suggest potential ovarian source. The ventral midline peritoneal nodularity may most assessable for biopsy. 3. Mild entrapment of the distal RIGHT ureter associated with the mesenteric nodularity. 4. Indeterminate renal lesions. Recommend follow-up MRI renal protocol at some point. These results will be called to the ordering clinician or representative by the Radiologist Assistant, and communication documented in the PACS or zVision Dashboard. Electronically Signed   By: Suzy Bouchard M.D.   On: 08/22/2017 15:09   Dg Abd 2 Views  Result Date: 08/13/2017 CLINICAL DATA:  Bowel obstruction EXAM: ABDOMEN - 2 VIEW COMPARISON:  None. FINDINGS: Scattered gas containing small and large bowel loops without obstructive bowel gas pattern. Surgical tacks project over the mid abdomen presumably from ventral hernia repair. No free air, organomegaly nor suspicious radiopaque calculi. Lower lumbar facet arthropathy. IMPRESSION: Nonobstructed, nondistended bowel gas pattern. Electronically Signed   By: Ashley Royalty M.D.   On: 08/14/2017 19:55   Dg Abd Acute W/chest  Result Date: 08/26/2017 CLINICAL DATA:  Follow-up distal colonic obstruction. EXAM: DG ABDOMEN ACUTE W/ 1V CHEST COMPARISON:  August 25, 2017 FINDINGS: There is a small left effusion. The chest is otherwise stable. Lucency over the upper abdomen on the upright view may represent free postoperative air or air within the skin folder incision. Free air would be expected after surgery yesterday. The colonic loops are significantly improved with placement of a colonic tube. No other change. IMPRESSION: 1. A colonic tube is been placed with decompression of the colon. 2. Probable postoperative free air, as expected.  3. No other change. Electronically Signed   By: Dorise Bullion III M.D   On: 08/26/2017 08:33   Dg Abd Acute W/chest  Result Date: 08/08/2017 CLINICAL DATA:  Preoperative examination. EXAM: DG ABDOMEN ACUTE W/ 1V CHEST COMPARISON:  08/24/2017; 08/22/2017; CT abdomen pelvis-08/22/2017 FINDINGS: Grossly unchanged cardiac silhouette and mediastinal contours with atherosclerotic plaque within the thoracic aorta. Loop recorder overlies the mid heart. Suspected slight reduction in persistent small left-sided effusion. Improved aeration of lung bases with persistent bibasilar opacities, left greater than right. No new focal airspace opacities. Pulmonary venous congestion without frank evidence of edema. No pneumothorax. No acute osseus abnormalities. Read demonstrated patulous distension of multiple loops of colon with index loop of descending colon within the left mid hemiabdomen measuring approximately 5.6 cm in diameter, similar to the 08/24/2017 examination. No pneumoperitoneum, pneumatosis or portal venous gas No definitive abnormal intra-abdominal calcifications. IMPRESSION: 1. Slight reduction in persistent small left-sided effusion and improved aeration of bilateral lung bases with persistent bibasilar opacities, atelectasis versus infiltrate. 2. Similar findings worrisome for distal colonic obstruction as demonstrated on recent abdominal CT. Electronically Signed   By: Sandi Mariscal M.D.   On: 08/04/2017 08:42   Dg Abd Acute W/chest  Result Date: 08/24/2017 CLINICAL DATA:  Lower abdominal pain. EXAM: DG ABDOMEN ACUTE W/ 1V CHEST COMPARISON:  Chest and abdominal x-rays from yesterday. FINDINGS: Stable cardiomediastinal silhouette. Loop recorder again noted. Mild pulmonary vascular congestion, slightly more conspicuous on today's study. Unchanged small left pleural effusion and left basilar atelectasis. No pneumothorax. Stable mild dilatation of the colon. No pneumoperitoneum. No acute osseous abnormality.  IMPRESSION: 1. Stable mild colonic dilatation. 2. Unchanged small left pleural effusion and left basilar atelectasis. Electronically Signed   By: Titus Dubin M.D.   On: 08/24/2017 08:44   Dg Abd Acute W/chest  Result Date: 08/27/2017 CLINICAL DATA:  Left lower quadrant pain EXAM: DG ABDOMEN ACUTE W/ 1V CHEST COMPARISON:  08/22/2017 FINDINGS: Cardiac shadow is within normal limits. Loop recorder is noted. Left basilar atelectatic changes and small effusion are noted increased from the previous day. Right lung remains clear. No free air is noted. Mild dilatation of the colon is noted similar to that seen on the prior CT examination. Contrast is noted within the bladder. No bony abnormality is seen. IMPRESSION: Stable dilatation of the colon when compared with the prior exam. Increasing left basilar atelectasis and effusion. Electronically Signed   By: Inez Catalina M.D.   On: 08/16/2017 07:21   Dg Abd Portable 2v  Result Date: 08/29/2017 CLINICAL DATA:  Peritoneal carcinomatosis.  Colonic obstruction. EXAM: PORTABLE ABDOMEN - 2 VIEW COMPARISON:  Acute abdominal series 08/26/2017 and KUB 08/20/2017 FINDINGS: Examination demonstrates collection of air under the left hemidiaphragm on the upright film some which is intraluminal although it would be difficult to exclude free peritoneal air. Bowel gas pattern is otherwise nonobstructive. There are 2 catheters over the left lower  quadrant/pelvis. Skin staples over the soft tissues of the lateral left pelvis/lower abdomen. Remainder of the exam is unchanged. IMPRESSION: Nonobstructive bowel gas pattern. Collection of air below the left hemidiaphragm on the upright film likely intraluminal although cannot exclude a component of free peritoneal air. Recommend clinical correlation and consider left-side-down decubitus films to exclude free peritoneal air. Two catheters over the left lower quadrant/left pelvis. These results were called by telephone at the time of  interpretation on 08/29/2017 at 5:28 pm to patient's nurse Council Mechanic, who verbally acknowledged these results and will notify patient's physician. Electronically Signed   By: Marin Olp M.D.   On: 08/29/2017 17:28   Dg C-arm 1-60 Min-no Report  Result Date: 08/22/2017 Fluoroscopy was utilized by the requesting physician.  No radiographic interpretation.   [place] Ekg Site Rite  Result Date: 08/27/2017 If Site Rite image not attached, placement could not be confirmed due to current cardiac rhythm.   Consults: Treatment Team:  Jules Husbands, MD Efrain Sella, MD Earlie Server, MD Flora Lipps, MD Lucilla Lame, MD   Subjective:    Overnight Issues: cellulitis noted on left lower side of abdomen. Patient is presently on Unasyn, margins were demarcated  Objective:  Vital signs for last 24 hours: Temp:  [98.3 F (36.8 C)-99.2 F (37.3 C)] 98.3 F (36.8 C) (06/29 0828) Pulse Rate:  [83-120] 83 (06/29 0800) Resp:  [13-28] 14 (06/29 0800) BP: (124-183)/(41-106) 146/59 (06/29 0800) SpO2:  [92 %-97 %] 96 % (06/29 0800) Weight:  [291 lb 0.1 oz (132 kg)] 291 lb 0.1 oz (132 kg) (06/29 0457)  Hemodynamic parameters for last 24 hours:    Intake/Output from previous day: 06/28 0701 - 06/29 0700 In: 1945.3 [I.V.:1745.3; IV Piggyback:200] Out: 2330 [Urine:1750; Drains:10]  Intake/Output this shift: Total I/O In: 415 [I.V.:415] Out: 120 [Urine:100; Drains:20]  Vent settings for last 24 hours:    Physical Exam:  Patient is awake, alert in no acute distress Vital signs: Please see the above listed vital signs Cardiovascular: Regular rate and rhythm Pulmonary: Clear to auscultation Abdominal: Patient with left lower abdominal cellulitis, presently demarcated by nurse Extremities:  1+ edema is appreciated Neurologic: No focal deficits noted  Assessment/Plan:   Peritoneal carcinomatosis with large bowel obstruction. Patient is now status post colonic decompressive tube along with  stenting of the colon.per surgery patient will be started on a clear liquid diet.pending results of pathology and oncology consultation  Possible abdominal wall cellulitis, will add vancomycin, demarcated edges cellulitis, pending surgery input   Probable aspiration. Is being empirically started on Unasyn for both pulmonary and intra-abdominal source  Leukocytosis, has been cultured, started on antibiotic therapy  Anemia. No evidence of active bleeding  Non-anion gap metabolic acidosis  Hermelinda Dellen, DO  *Care during the described time interval was provided by me and/or other providers on the critical care team.  I have reviewed this patient's available data, including medical history, events of note, physical examination and test results as part of my evaluation. Patient ID: Alicia Conley, female   DOB: 1941/02/21, 77 y.o.   MRN: [id number]

## 2017-08-31 NOTE — Consult Note (Signed)
Pharmacy Antibiotic Note  Alicia Conley is a 77 year old female s/p  Colonic decompressive tube now with possible abdominal wall cellulitis. Pharmacy has been consulted for vancomycin dosing.  Plan: Vancomycin 1g once. Will give next dose in 6 hours for stacked dosing Vancomycin 1500mg  q 24 hr Goal trough 10-15 Trough prior to the 5th dose 7/2 @ 1730   Height: 5\' 5"  (165.1 cm) Weight: 291 lb 0.1 oz (132 kg) IBW/kg (Calculated) : 57  Temp (24hrs), Avg:98.8 F (37.1 C), Min:98.3 F (36.8 C), Max:99.2 F (37.3 C)  Recent Labs  Lab 08/26/17 0457 08/27/17 0420 08/20/2017 0414 08/14/2017 0418 08/29/17 0516 08/30/17 0605 08/31/17 0617  WBC 20.1* 14.8* 16.6*  --  17.4* 19.2*  --   CREATININE 1.16* 1.46*  --  1.59* 1.15* 1.20* 1.14*    Estimated Creatinine Clearance: 57.7 mL/min (A) (by C-G formula based on SCr of 1.14 mg/dL (H)).    No Known Allergies  Antimicrobials this admission: unasyn 6/28 >>  vancomycin 6/29 >>   Dose adjustments this admission:   Microbiology results:  Thank you for allowing pharmacy to be a part of this patient's care.  Ramond Dial, Pharm.D, BCPS Clinical Pharmacist  08/31/2017 10:45 AM

## 2017-08-31 NOTE — Progress Notes (Signed)
Pt transferring to unit 2C room 219 today. Pt/family is aware and agreeable to transfer. Assessment is unchanged from this morning and receiving RN Danae Chen) has been given report. Belongings sent with pt at bedside. Pt placed on telemetry monitor 40-51. Central tele called with second verification called at time of transfer.

## 2017-08-31 NOTE — Plan of Care (Signed)
Patient given PRN meds x 3 for pain.  Patient reports pain 10/10 in abdomen at surg site. Abdomen red and warm to touch on left side.  NP M.Tukov notified.  Will await evaluation from surgery.  Area will be marked to note changes. Unable to tolerate movement, turning, patient care.  Will continue to monitor.    TPN infusing  as ordered.. Per Lattie Haw at pharmacy, TPN and fat emulsion can infuse together.

## 2017-08-31 NOTE — Progress Notes (Signed)
Patient transferred from ICU.  Patient groans in pain whenever touched or moved

## 2017-08-31 NOTE — Progress Notes (Signed)
[facility] NAME: Alicia Conley    MR#:  [id number]  DATE OF BIRTH:  02-24-1941  SUBJECTIVE:  Patient drowsy after pain medications. Family at bedside  REVIEW OF SYSTEMS:  Review of Systems  Constitutional: Negative for chills, fever and weight loss.  HENT: Negative for ear discharge, ear pain and nosebleeds.   Eyes: Negative for blurred vision, pain and discharge.  Respiratory: Negative for sputum production, shortness of breath, wheezing and stridor.   Cardiovascular: Negative for chest pain, palpitations, orthopnea and PND.  Gastrointestinal: Negative for abdominal pain, diarrhea, nausea and vomiting.  Genitourinary: Negative for frequency and urgency.  Musculoskeletal: Negative for back pain and joint pain.  Neurological: Positive for weakness. Negative for sensory change, speech change and focal weakness.  Psychiatric/Behavioral: Negative for depression and hallucinations. The patient is not nervous/anxious.    DRUG ALLERGIES:  No Known Allergies  VITALS:  Blood pressure (!) 143/78, pulse 80, temperature 98.8 F (37.1 C), temperature source Oral, resp. rate 19, height 5\' 5"  (1.651 m), weight 132 kg (291 lb 0.1 oz), SpO2 100 %.  PHYSICAL EXAMINATION:  GENERAL:  77 y.o.-year-old patient lying in the bed with no acute distress. obese EYES: Pupils equal, round, reactive to light and accommodation. No scleral icterus. Extraocular muscles intact.  HEENT: Head atraumatic, normocephalic. Oropharynx and nasopharynx clear.  NECK:  Supple, no jugular venous distention. No thyroid enlargement, no tenderness.  LUNGS: Normal breath sounds bilaterally, no wheezing, rales,rhonchi or crepitation. No use of accessory muscles of respiration.  CARDIOVASCULAR: S1, S2 normal. No murmurs, rubs, or gallops.  ABDOMEN: Soft, nontender, nondistended. Bowel sounds present. No organomegaly or mass.  EXTREMITIES: ++ pedal edema,no cyanosis, or  clubbing.  NEUROLOGIC: Cranial nerves II through XII are intact. Muscle strength 5/5 in all extremities. Sensation intact. Gait not checked.  PSYCHIATRIC: The patient is alert and oriented x 3.  SKIN: No obvious rash, lesion, or ulcer.   Physical Exam LABORATORY PANEL:   CBC Recent Labs  Lab 08/30/17 0605  WBC 19.2*  HGB 10.5*  HCT 32.4*  PLT 378   ------------------------------------------------------------------------------------------------------------------  Chemistries  Recent Labs  Lab 08/29/17 0516  08/31/17 0617  NA 138   < > 140  K 4.2   < > 3.9  CL 113*   < > 111  CO2 20*   < > 20*  GLUCOSE 155*   < > 150*  BUN 32*   < > 48*  CREATININE 1.15*   < > 1.14*  CALCIUM 7.8*   < > 8.0*  MG 2.0   < > 2.1  AST 29  --   --   ALT 15  --   --   ALKPHOS 65  --   --   BILITOT 0.4  --   --    < > = values in this interval not displayed.   ------------------------------------------------------------------------------------------------------------------  Cardiac Enzymes No results for input(s): TROPONINI in the last 168 hours. ------------------------------------------------------------------------------------------------------------------  RADIOLOGY:  Ct Abdomen Pelvis Wo Contrast  Result Date: 08/29/2017 CLINICAL DATA:  Abdominal xray revealed collection of air below the left hemidiaphragm likely intraluminal, however unable to exclude a component of free peritoneal air radiologist recommended CT Abd Pelvis. Patient has abdominal pain. Medical record history reports a laparotomy on 08/30/2017. EXAM: CT ABDOMEN AND PELVIS WITHOUT CONTRAST TECHNIQUE: Multidetector CT imaging of the abdomen and pelvis was performed following the standard protocol without IV contrast. COMPARISON:  Current abdomen radiographs. Prior CT dated  08/22/2017. FINDINGS: Lower chest: Small pleural effusions. There is dependent lung base opacity, left greater than right, consistent with atelectasis.  Heart is normal in size. Hepatobiliary: There are 4 low-density liver masses consistent with cysts, largest from the posterior segment of the right lobe measuring 5 cm. No other liver abnormalities. The gallbladder is distended. There is dependent density in the gallbladder consistent with sludge, stones or a combination. No wall thickening. No bile duct dilation. Pancreas: Unremarkable. No pancreatic ductal dilatation or surrounding inflammatory changes. Spleen: Normal in size without focal abnormality. Adrenals/Urinary Tract: No adrenal masses. Mild to moderate right hydronephrosis and hydroureter. Right ureter is dilated into the pelvis. It is decompressed beginning just above the posterior bladder. No left hydronephrosis. Left ureter is normal course and in caliber. No intrarenal stones. Hyperattenuating mass, average Hounsfield units of 39, arises from the upper pole the right kidney, likely a cyst complicated by hemorrhage or proteinaceous contents. It measures 15 mm. There is a smaller more subtle similar-appearing mass from the medial upper pole the left kidney. No other renal masses. Bladder is decompressed by Foley catheter. Stomach/Bowel: The distal sigmoid colon is irregular with adjacent nodular opacities, consistent with colon carcinoma. Above this irregular area of sigmoid colon lies a stent which extends from the mid sigmoid colon to the lower descending colon. There is a percutaneously placed 2 at enters the left mid abdomen. The tip of this 2 has a configuration consistent with a gastrostomy tube. However, this lies in the:. There is significant subcutaneous air adjacent to the tube in the left mid abdomen lateral to the umbilicus. This portion of the colon is mostly decompressed. Right colon is normal in caliber. An no inflammatory changes. Stomach is mild-to-moderately distended. There is dependent fluid with some dependent contrast. No wall thickening or adjacent inflammation. Small bowel is  normal in caliber. There is free intraperitoneal air that is collecting along the anterior mid to upper abdomen, accounting for the abnormality noted on the current radiographs. A trace amount of dense material lies adjacent to the spleen where it is in close proximity to the gastric fundus. Other small foci of dense material lie along the surface of the right liver lobe. These findings were present on the CT from 08/22/2017 and suggest peritoneal carcinomatosis. No ascites. Vascular/Lymphatic: No pathologically enlarged lymph nodes. Aortic atherosclerosis. Reproductive: Uterus is normal size and configuration. No ovarian/adnexal masses. Other: There is postsurgical scarring with apparent hernia mesh along the anterior abdominal midline stable from the prior CT. A surgical drainage catheter enters the left lower quadrant. Musculoskeletal: No acute fracture or acute finding. 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 No osteoblastic or osteolytic lesions. IMPRESSION: 1. There is free intraperitoneal air. This is greater than expected 4 days post laparotomy, but still could be postsurgical in origin. 2. There is no definite source for this air. However, there is a tube that has the appearance of a gastrostomy tube. This enters the left transverse colon. This is likely a colonic tube for decompression. There is air surrounding the tube in the subcutaneous soft tissues at its insertion site. This is a possible source of an air leak. 3. 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 4. There is mild to moderate right hydronephrosis and right hydroureter. The partial obstruction is at the level of the neoplastic changes around the sigmoid colon. This is also stable from  prior CT. Electronically Signed   By: Lajean Manes M.D.   On: 08/29/2017 20:59   Dg Abd Portable 2v  Result  Date: 08/29/2017 CLINICAL DATA:  Peritoneal carcinomatosis.  Colonic obstruction. EXAM: PORTABLE ABDOMEN - 2 VIEW COMPARISON:  Acute abdominal series 08/26/2017 and KUB 08/22/2017 FINDINGS: Examination demonstrates collection of air under the left hemidiaphragm on the upright film some which is intraluminal although it would be difficult to exclude free peritoneal air. Bowel gas pattern is otherwise nonobstructive. There are 2 catheters over the left lower quadrant/pelvis. Skin staples over the soft tissues of the lateral left pelvis/lower abdomen. Remainder of the exam is unchanged. IMPRESSION: Nonobstructive bowel gas pattern. Collection of air below the left hemidiaphragm on the upright film likely intraluminal although cannot exclude a component of free peritoneal air. Recommend clinical correlation and consider left-side-down decubitus films to exclude free peritoneal air. Two catheters over the left lower quadrant/left pelvis. These results were called by telephone at the time of interpretation on 08/29/2017 at 5:28 pm to patient's nurse Council Mechanic, who verbally acknowledged these results and will notify patient's physician. Electronically Signed   By: Marin Olp M.D.   On: 08/29/2017 17:28    ASSESSMENT AND PLAN:  77 year old female patient with history of hypertension, hyperlipidemia, hypothyroidism referred from GI clinic for direct admission for abdominal discomfort.  *Acute Peritoneal carcinomatosis Etiology unknown--? Gyn origin? -Status post flexible sigmoidoscopy noted for internal hemorrhoids/diverticulosis August 23, 2017 -Status post attempted diverting colostomy on August 25, 2017 which was aborted given severe carcinomatosis-sample  taken and sent to lab for pathology review -s/p colonic stent placement  by gastroenterology/Dr. Alice Reichert -pt intubated since aspirated prior to procedure. Now extubated and on 2 liter Kirkpatrick  *Acute Hypokalemia Repleted  *Acute hypomagnesemia Repleted  thru IV magnesium  *Chronic benign essential hypertension Continue current regiment  * ChronicHyperlipidemia Resume statinupon discharge  Prognosis poor -palliative care consulted  On TPN  All the records are reviewed and case discussed with Care Management/Social Workerr. Management plans discussed with the patient, family and they are in agreement.  CODE STATUS: full  TOTAL TIME TAKING CARE OF THIS PATIENT: 25 minutes.  POSSIBLE D/C IN 1-5 DAYS, DEPENDING ON CLINICAL CONDITION.  Neita Carp M.D on 08/31/2017   Between 7am to 6pm - Pager - [phone]  After 6pm go to [email or web address] - password [email or web address] [facility]  Office  [phone]  CC: Primary care physician; [name], MD  Note: This dictation was prepared with Dragon dictation along with smaller phrase technology. Any transcriptional errors that result from this process are unintentional.

## 2017-08-31 NOTE — Progress Notes (Signed)
Physical Therapy Evaluation Patient Details Name: Alicia Conley MRN: [id number] DOB: Nov 29, 1940 Today's Date: 08/31/2017   History of Present Illness  Alicia Conley  is a 77 y.o. female with a known history of hyperlipidemia, hypertension, hypothyroidism presented to the emergency room with abdominal pain.  The abdominal pain is going on for the last 2-1/2 weeks abdominal pain started 2 end of weeks ago and patient had been worked up with [facility] hospital with a CT abdomen which revealed partial distal colonic obstruction.  The colon was filled up with stool to.  She was seen at our ER on Saturday and was evaluated and discharged back to home.  Patient still has abdominal discomfort and unable to eat and drink any fluids and and saw gastroenterologist Dr. Alice Conley in the clinic today.  Dr Alicia Conley referred the patient for direct admission to the hospital.  No abdominal pain is generalized 4 out of 10 on a scale of 1-10.  Last bowel movement more than 1 week ago. She is now s/p laparotomy with decompression secondary to distal large bowel obstruction due to acute peritoneal carcinomatosis. She currently has a foley catheter, JP drain, and colon drainage tube.  Clinical Impression  Pt admitted with above diagnosis. Pt currently with functional limitations due to the deficits listed below (see PT Problem List).  Pt very guarded and painful during bed mobility. She is weak and requires maxA+2 to get up to EOB. Cues for safe hand placement during transfers. ModA+2 to come to standing. Pt weak in standing and requires repeated cues to improve posture. Attempted static marches however pt is unable. Pt is unable to ambualte at this time. Encouraged pt and family to get up to EOB regularly. Pt is in a lot of pain during the session and is minimally receptive to therapist instruction. Pt will benefit from PT services to address deficits in strength, balance, and mobility in order to return to full function at  home.       Follow Up Recommendations SNF    Equipment Recommendations  None recommended by PT    Recommendations for Other Services       Precautions / Restrictions Precautions Precautions: Fall Restrictions Weight Bearing Restrictions: No      Mobility  Bed Mobility Overal bed mobility: Needs Assistance Bed Mobility: Supine to Sit;Sit to Supine     Supine to sit: Max assist;+2 for physical assistance Sit to supine: Max assist;+2 for physical assistance   General bed mobility comments: Pt very guarded and painful. She is weak and requires maxA+2 to get up to EOB  Transfers Overall transfer level: Needs assistance Equipment used: Rolling walker (2 wheeled) Transfers: Sit to/from Stand Sit to Stand: Mod assist;+2 physical assistance         General transfer comment: Cues for safe hand placement. ModA+2 to come to standing. Pt weak in standing and requires repeated cues to improve posture. Attempted static marches however pt is unable. Pt is unable to ambualte at this time  Ambulation/Gait                Stairs            Wheelchair Mobility    Modified Rankin (Stroke Patients Only)       Balance Overall balance assessment: Needs assistance Sitting-balance support: Bilateral upper extremity supported Sitting balance-Leahy Scale: Fair Sitting balance - Comments: Eventually is able to sit up at EOB without external support from therapist or CNA   Standing balance support: Bilateral  upper extremity supported Standing balance-Leahy Scale: Poor Standing balance comment: Requires constant minA+1 to remain in standing                             Pertinent Vitals/Pain Pain Assessment: Faces Faces Pain Scale: Hurts whole lot Pain Location: Abdomen Pain Descriptors / Indicators: Operative site guarding Pain Intervention(s): Monitored during session;Premedicated before session    Home Living Family/patient expects to be discharged  to:: Private residence Living Arrangements: Spouse/significant other Available Help at Discharge: Family Type of Home: House Home Access: Ramped entrance     Home Layout: One level Home Equipment: Environmental consultant - 2 wheels;Walker - 4 wheels;Cane - single point;Shower seat;Grab bars - tub/shower      Prior Function Level of Independence: Independent         Comments: Family reports pt was previoulsy independent with ADLs/IADLs. No falls reported in the last 12 months     Hand Dominance        Extremity/Trunk Assessment   Upper Extremity Assessment Upper Extremity Assessment: Generalized weakness    Lower Extremity Assessment Lower Extremity Assessment: Generalized weakness       Communication   Communication: No difficulties  Cognition Arousal/Alertness: Lethargic Behavior During Therapy: Restless Overall Cognitive Status: Within Functional Limits for tasks assessed                                        General Comments      Exercises     Assessment/Plan    PT Assessment Patient needs continued PT services  PT Problem List Decreased strength;Decreased activity tolerance;Decreased balance;Pain       PT Treatment Interventions DME instruction;Gait training;Stair training;Functional mobility training;Therapeutic activities;Therapeutic exercise;Balance training;Neuromuscular re-education;Patient/family education    PT Goals (Current goals can be found in the Care Plan section)  Acute Rehab PT Goals Patient Stated Goal: Return to prior function PT Goal Formulation: With patient/family Time For Goal Achievement: 09/14/17 Potential to Achieve Goals: Fair    Frequency Min 2X/week   Barriers to discharge        Co-evaluation               AM-PAC PT "6 Clicks" Daily Activity  Outcome Measure Difficulty turning over in bed (including adjusting bedclothes, sheets and blankets)?: Unable Difficulty moving from lying on back to sitting on the  side of the bed? : Unable Difficulty sitting down on and standing up from a chair with arms (e.g., wheelchair, bedside commode, etc,.)?: Unable Help needed moving to and from a bed to chair (including a wheelchair)?: Total Help needed walking in hospital room?: Total Help needed climbing 3-5 steps with a railing? : Total 6 Click Score: 6    End of Session Equipment Utilized During Treatment: Gait belt Activity Tolerance: Patient limited by pain Patient left: in bed;with call bell/phone within reach;with bed alarm set Nurse Communication: Mobility status PT Visit Diagnosis: Unsteadiness on feet (R26.81);Muscle weakness (generalized) (M62.81)    Time: 1400-1435 PT Time Calculation (min) (ACUTE ONLY): 35 min   Charges:   PT Evaluation $PT Eval Moderate Complexity: 1 Mod PT Treatments $Therapeutic Activity: 8-22 mins   PT G Codes:        Jason D Huprich PT, DPT, GCS   Huprich,Jason 08/31/2017, 4:11 PM

## 2017-08-31 NOTE — Progress Notes (Signed)
[place] Hospital Day(s): 10.   Post op day(s): 3 Days Post-Op.   Interval History: Patient seen and examined, no acute events or new complaints overnight. Patient reports moderate LLQ peri-drain tenderness to palpation, denies fever/chills, N/V, CP, or SOB. She likewise denies any flatus or BM, though her husband states he hear her pass flatus once yesterday.  Review of Systems:  Constitutional: denies fever, chills  Respiratory: denies any shortness of breath  Cardiovascular: denies chest pain or palpitations  Gastrointestinal: abdominal pain, N/V, and bowel function as per interval history Musculoskeletal: denies pain, decreased motor or sensation Integumentary: denies any other rashes or skin discolorations except post-surgical abdominal wounds  Vital signs in last 24 hours: [min-max] current  Temp:  [98.7 F (37.1 C)-99.2 F (37.3 C)] 99.1 F (37.3 C) (06/29 0200) Pulse Rate:  [88-120] 94 (06/29 0600) Resp:  [13-28] 13 (06/29 0600) BP: (124-183)/(40-106) 154/55 (06/29 0600) SpO2:  [92 %-97 %] 92 % (06/29 0600) Weight:  [291 lb 0.1 oz (132 kg)] 291 lb 0.1 oz (132 kg) (06/29 0457)     Height: 5\' 5"  (165.1 cm) Weight: 291 lb 0.1 oz (132 kg) BMI (Calculated): 48.43   Intake/Output this shift:  No intake/output data recorded.   Intake/Output last 2 shifts:  @IOLAST2SHIFTS @   Physical Exam:  Constitutional: alert, cooperative and no distress  Respiratory: breathing non-labored at rest  Cardiovascular: regular rate and sinus rhythm  Gastrointestinal: soft and obese, but non-distended with superficial wound VAC intact and LLQ large (colonic) and JP drain well-secured with serosanguinous fluid in bulb and tubing, mild-/moderate- Left flank cellulitis and focal peri-drain erythema, no purulent or feculent drainage   Labs:  CBC Latest Ref Rng & Units 08/30/2017 08/29/2017 08/31/2017  WBC 3.6 - 11.0 K/uL 19.2(H) 17.4(H) 16.6(H)  Hemoglobin 12.0 - 16.0 g/dL 10.5(L)  10.0(L) 11.3(L)  Hematocrit 35.0 - 47.0 % 32.4(L) 30.3(L) 34.3(L)  Platelets 150 - 440 K/uL 378 346 367   CMP Latest Ref Rng & Units 08/31/2017 08/30/2017 08/29/2017  Glucose 70 - 99 mg/dL 150(H) 155(H) 155(H)  BUN 8 - 23 mg/dL 48(H) 45(H) 32(H)  Creatinine 0.44 - 1.00 mg/dL 1.14(H) 1.20(H) 1.15(H)  Sodium 135 - 145 mmol/L 140 138 138  Potassium 3.5 - 5.1 mmol/L 3.9 4.2 4.2  Chloride 98 - 111 mmol/L 111 111 113(H)  CO2 22 - 32 mmol/L 20(L) 18(L) 20(L)  Calcium 8.9 - 10.3 mg/dL 8.0(L) 8.0(L) 7.8(L)  Total Protein 6.5 - 8.1 g/dL - - 5.3(L)  Total Bilirubin 0.3 - 1.2 mg/dL - - 0.4  Alkaline Phos 38 - 126 U/L - - 65  AST 15 - 41 U/L - - 29  ALT 0 - 44 U/L - - 15   Imaging studies: No new pertinent imaging studies   Assessment/Plan: 77 y.o.femalewith distal large bowel obstruction attributed to external compression by extensive diffuse peritoneal carcinomatosis6DaysPost-Ops/p laparotomy with unsuccessful attempted colostomy instead with decompressing colon-drainage tube and 3 Days Post-Procedure s/p colonoscopic placement of decompressing colonic stents x2, complicated bypre-stent aspiration pneumonia/pneumonitis, leukocytosis (possibly attributable to post-aspiration vs translocation of bacteria around colostomy tube during colonoscopy),and bycomorbidities includingmorbid obesity (BMI 47), HTN, hypercholesterolemia, thyroid disease (not otherwise specified), and chronic back pain.  - superficial wound VAC removed - pain control prn (minimize narcotics) - clear liquids diet + clear liquids supplements  - anticipate will advance with increased bowel function, TPN for now             - antibiotics to cover aspiration pneumonia and  colonic flora (GNR, anaerobes) - attempted to answer as many of patient's and her family's questions as possible - palliative decompressing distal colonic stent byDr. Toledoappreciated  -  consider gynecologic oncology consultation for ovarian cancer - medical management of comorbidities as per primary team - DVT prophylaxis, ambulation encouraged             - no further surgical intervention  All of the above findings and recommendations were discussed with the patient, patient's family, and patient's RN, and all of patient's and family's questions were answered to their expressed satisfaction.   Thank you for the opportunity to participate in this patient's care.  -- Marilynne Drivers Rosana Hoes, MD, [place]: [facility] - Partnering for exceptional care. Office: [phone]

## 2017-08-31 NOTE — Progress Notes (Signed)
[place] NOTE   Pharmacy Consult for TPN Indication: Inadequate oral intake related to poor appetite, nausea, vomiting   Patient Measurements: Height: 5\' 5"  (165.1 cm) Weight: 291 lb 0.1 oz (132 kg) IBW/kg (Calculated) : 57 TPN AdjBW (KG): 74.5 Body mass index is 48.43 kg/m.  Assessment:  77 yo female with distal bowel obstruction.   TPN Access: central line in place TPN start date: 08/27/17 Nutritional Goals (per RD recommendation on 08/27/17): KCal: 2014 Protein: 100g Fluid: 2292  Goal TPN rate is 83 ml/hr  Plan:  Clinimix E 5/15 TPN at 85mL/hr. 20% lipids at 55mL/hr for 12 hours.  This TPN provides 100 g of protein, 150 g of dextrose, and 60 g of lipids which provides 1776 kCals per day, meeting 90% of patient needs Electrolytes in TPN: WNL. Add MVI, trace elements SSI/24 hrs: 7 units Clear liquid diet ordered. Monitor TPN labs  Kerstin Crusoe A, PharmD 08/31/2017,9:18 AM

## 2017-09-01 ENCOUNTER — Inpatient Hospital Stay: Payer: Medicare HMO

## 2017-09-01 DIAGNOSIS — R1114 Bilious vomiting: Secondary | ICD-10-CM

## 2017-09-01 LAB — BASIC METABOLIC PANEL
Anion gap: 9 (ref 5–15)
BUN: 51 mg/dL — AB (ref 8–23)
CO2: 22 mmol/L (ref 22–32)
CREATININE: 1.02 mg/dL — AB (ref 0.44–1.00)
Calcium: 8.2 mg/dL — ABNORMAL LOW (ref 8.9–10.3)
Chloride: 110 mmol/L (ref 98–111)
GFR calc Af Amer: >60 mL/min (ref >60)
GFR, EST NON AFRICAN AMERICAN: 52 mL/min — AB (ref >60)
GLUCOSE: 124 mg/dL — AB (ref 70–99)
POTASSIUM: 3.7 mmol/L (ref 3.5–5.1)
Sodium: 141 mmol/L (ref 135–145)

## 2017-09-01 LAB — GLUCOSE, CAPILLARY
GLUCOSE-CAPILLARY: 107 mg/dL — AB (ref 70–99)
GLUCOSE-CAPILLARY: 117 mg/dL — AB (ref 70–99)
GLUCOSE-CAPILLARY: 120 mg/dL — AB (ref 70–99)
GLUCOSE-CAPILLARY: 135 mg/dL — AB (ref 70–99)
Glucose-Capillary: 113 mg/dL — ABNORMAL HIGH (ref 70–99)
Glucose-Capillary: 99 mg/dL (ref 70–99)

## 2017-09-01 LAB — CBC
HCT: 30.3 % — ABNORMAL LOW (ref 35.0–47.0)
Hemoglobin: 9.7 g/dL — ABNORMAL LOW (ref 12.0–16.0)
MCH: 27.9 pg (ref 26.0–34.0)
MCHC: 31.9 g/dL — ABNORMAL LOW (ref 32.0–36.0)
MCV: 87.4 fL (ref 80.0–100.0)
Platelets: 401 K/uL (ref 150–440)
RBC: 3.47 MIL/uL — ABNORMAL LOW (ref 3.80–5.20)
RDW: 15.8 % — ABNORMAL HIGH (ref 11.5–14.5)
WBC: 21 K/uL — ABNORMAL HIGH (ref 3.6–11.0)

## 2017-09-01 LAB — MAGNESIUM: Magnesium: 2.2 mg/dL (ref 1.7–2.4)

## 2017-09-01 LAB — PHOSPHORUS: Phosphorus: 5.2 mg/dL — ABNORMAL HIGH (ref 2.5–4.6)

## 2017-09-01 MED ORDER — HYDROMORPHONE HCL 1 MG/ML IJ SOLN
0.5000 mg | Freq: Four times a day (QID) | INTRAMUSCULAR | Status: DC | PRN
  Administered 2017-09-02 – 2017-09-05 (×8): 0.5 mg via INTRAVENOUS
  Filled 2017-09-01 (×8): qty 0.5

## 2017-09-01 MED ORDER — TRACE MINERALS CR-CU-MN-SE-ZN 10-1000-500-60 MCG/ML IV SOLN
INTRAVENOUS | Status: AC
  Administered 2017-09-01: 18:00:00 via INTRAVENOUS
  Filled 2017-09-01: qty 1992

## 2017-09-01 MED ORDER — FAT EMULSION PLANT BASED 20 % IV EMUL
250.0000 mL | INTRAVENOUS | Status: AC
  Administered 2017-09-01: 250 mL via INTRAVENOUS
  Filled 2017-09-01 (×2): qty 250

## 2017-09-01 MED ORDER — TRAMADOL HCL 50 MG PO TABS
50.0000 mg | ORAL_TABLET | Freq: Four times a day (QID) | ORAL | Status: DC | PRN
  Administered 2017-09-01: 50 mg via ORAL
  Filled 2017-09-01: qty 1

## 2017-09-01 NOTE — Progress Notes (Signed)
PHARMACY - ADULT TOTAL PARENTERAL NUTRITION CONSULT NOTE   Pharmacy Consult for TPN Indication: Inadequate oral intake related to poor appetite, nausea, vomiting   Patient Measurements: Height: 5\' 5"  (165.1 cm) Weight: (!) 301 lb 12.8 oz (136.9 kg) IBW/kg (Calculated) : 57 TPN AdjBW (KG): 74.5 Body mass index is 50.22 kg/m.  Assessment:  77 yo female with distal bowel obstruction.   TPN Access: central line in place TPN start date: 08/27/17 Nutritional Goals (per RD recommendation on 08/27/17): KCal: 2014 Protein: 100g Fluid: 2292  Goal TPN rate is 83 ml/hr  Plan:  Clinimix E 5/15 TPN at 51mL/hr. 20% lipids at 43mL/hr for 12 hours.  This TPN provides 100 g of protein, 150 g of dextrose, and 60 g of lipids which provides 1776 kCals per day, meeting 90% of patient needs Electrolytes in TPN: Phos elevated at 5.2- monitor/reevaluate in am Add MVI, trace elements SSI/24 hrs: 5 units Clear liquid diet ordered and patient tolerating so far. Monitor TPN labs  Clemmie Marxen A, PharmD 09/01/2017,10:20 AM

## 2017-09-01 NOTE — Progress Notes (Signed)
Patient ID: Alicia Conley, female   DOB: 06-04-1940, 77 y.o.   MRN: [id number]     [place] Hospital Day(s): 11.   Post op day(s): 4 Days Post-Op.   Interval History: Patient seen and examined, no acute events or new complaints overnight. Patient denies any new complain, denies flatus or bowel movement. Patient was alone at room in no distress.   Vital signs in last 24 hours: [min-max] current  Temp:  [97.5 F (36.4 C)-98.8 F (37.1 C)] 97.8 F (36.6 C) (06/30 0441) Pulse Rate:  [75-93] 75 (06/30 0453) Resp:  [12-25] 20 (06/30 0441) BP: (96-153)/(43-104) 153/49 (06/30 0453) SpO2:  [94 %-100 %] 94 % (06/30 0441) Weight:  [136.9 kg (301 lb 12.8 oz)] 136.9 kg (301 lb 12.8 oz) (06/30 0453)     Height: 5\' 5"  (165.1 cm) Weight: (!) 136.9 kg (301 lb 12.8 oz) BMI (Calculated): 50.22   Intake/Output this shift:  Colonic drain: 70 mL  Physical Exam:  Constitutional: alert, cooperative and no distress  Cardiovascular: regular rate and sinus rhythm  Gastrointestinal: soft, moderate-tender on lower quadrants, and non-distended. There is decreased erythema from marked area on the left flank.   Labs:  CBC Latest Ref Rng & Units 09/01/2017 08/30/2017 08/29/2017  WBC 3.6 - 11.0 K/uL 21.0(H) 19.2(H) 17.4(H)  Hemoglobin 12.0 - 16.0 g/dL 9.7(L) 10.5(L) 10.0(L)  Hematocrit 35.0 - 47.0 % 30.3(L) 32.4(L) 30.3(L)  Platelets 150 - 440 K/uL 401 378 346   CMP Latest Ref Rng & Units 09/01/2017 08/31/2017 08/30/2017  Glucose 70 - 99 mg/dL 124(H) 150(H) 155(H)  BUN 8 - 23 mg/dL 51(H) 48(H) 45(H)  Creatinine 0.44 - 1.00 mg/dL 1.02(H) 1.14(H) 1.20(H)  Sodium 135 - 145 mmol/L 141 140 138  Potassium 3.5 - 5.1 mmol/L 3.7 3.9 4.2  Chloride 98 - 111 mmol/L 110 111 111  CO2 22 - 32 mmol/L 22 20(L) 18(L)  Calcium 8.9 - 10.3 mg/dL 8.2(L) 8.0(L) 8.0(L)  Total Protein 6.5 - 8.1 g/dL - - -  Total Bilirubin 0.3 - 1.2 mg/dL - - -  Alkaline Phos 38 - 126 U/L - - -  AST 15 - 41 U/L - - -  ALT 0 - 44 U/L  - - -   Assessment/Plan:  77 y.o. female with large bowel obstruction with carcinomatosis. Still unknown the primary malignancy. CT scan suggest sigmoid mass. CEA normal. CA 125 elevated. 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 Patient tolerating clear liquid diet. Unknown bowel function. There is no nausea or vomiting. Agree to continue antibiotic therapy, continue with clear liquid diet and follow Oncology assessment and recommendations for palliative treatment. No surgical need at this time. Will continue to follow with colonic tube and wound management.   Arnold Long, MD

## 2017-09-01 NOTE — Progress Notes (Signed)
Pt vomited some green bile looking substance so I called Dr. Darvin Neighbours and he said monitor and give zofran.

## 2017-09-01 NOTE — Clinical Social Work Note (Signed)
Clinical Social Work Assessment  Patient Details  Name: Alicia Conley MRN: [id number] Date of Birth: 1941/02/15  Date of referral:  09/01/17               Reason for consult:  Facility Placement                Permission sought to share information with:  Chartered certified accountant granted to share information::  Yes, Verbal Permission Granted  Name::        Agency::  [facility] SNFs except [facility]  Relationship::     Contact Information:     Housing/Transportation Living arrangements for the past 2 months:  [place] of Information:  Patient, Medical Team, Adult Children Patient Interpreter Needed:  None Criminal Activity/Legal Involvement Pertinent to Current Situation/Hospitalization:  No - Comment as needed Significant Relationships:  Adult Children, Warehouse manager, Spouse, Other Family Members Lives with:  Spouse Do you feel safe going back to the place where you live?  Yes Need for family participation in patient care:  No (Coment)  Care giving concerns:  PT recommendation for SNF   Social Worker assessment / plan:  The CSW met with the patient, her daughter, and her granddaughter at bedside to discuss discharge planning. The CSW explained the PT recommendation and the options for discharge ([place] vs SNF). The patient's daughter reported that she would prefer that her mother return home rather than discharge to SNF; however, she wants to discuss with her mother's husband as he provides the patient's care. The CSW explained the referral process for SNF, and the patient's daughter gave permission to send the referral should they choose that route. She did share that [facility] is not an option due to past experiences.  The patient has a chemotherapy and radiation plan, and the family is aware that his will be a barrier to placement. The CSW also explained that the managed care provider [facility]) would have to provide prior authorization for SNF  and that denial of coverage is always a possibility.   The CSW has begun the referral process. The CSW will follow up with the patient and her family to continue discharge planning. The CSW has received the patient's PASRR.  Employment status:  Retired Nurse, adult PT Recommendations:  [place] / Referral to community resources:  [place]  Patient/Family's Response to care:  The patient and her family thanked the CSW.  Patient/Family's Understanding of and Emotional Response to Diagnosis, Current Treatment, and Prognosis:  The patient's family is reticent to have the patient discharge to SNF. They will need a family discussion prior to making a decision.  Emotional Assessment Appearance:  Appears stated age Attitude/Demeanor/Rapport:  Gracious Affect (typically observed):  Stable Orientation:  Oriented to Self, Oriented to Place, Oriented to  Time, Oriented to Situation Alcohol / Substance use:  Never Used Psych involvement (Current and /or in the community):  No (Comment)  Discharge Needs  Concerns to be addressed:  Discharge Planning Concerns, Care Coordination Readmission within the last 30 days:  No Current discharge risk:  Chronically ill Barriers to Discharge:  Continued Medical Work up   [employer], LCSW 09/01/2017, 3:09 PM

## 2017-09-01 NOTE — NC FL2 (Signed)
[place] LEVEL OF CARE SCREENING TOOL     IDENTIFICATION  Patient Name: Alicia Conley Birthdate: 03/16/1940 Sex: female Admission Date (Current Location): 08/22/2017  [place] and [place] Number:  Engineering geologist and Address:  [facility], [address] [place], [place] [address]      Provider Number: [id number]  Attending Physician Name and Address:  Hillary Bow, MD  Relative Name and Phone Number:  Shanette Tamargo (Spouse) [phone], Ilda Foil (Daughter) [phone], Thornell Mule (granddaughter) [phone]    Current Level of Care: Hospital Recommended Level of Care: [place] Prior Approval Number:    Date Approved/Denied: 09/01/17 PASRR Number: [id number]  Discharge Plan: SNF    Current Diagnoses: Patient Active Problem List   Diagnosis Date Noted  . Large bowel obstruction ([place])   . Abdominal carcinomatosis ([place])   . Abdominal pain 08/08/2017  . Ileus ([place]) 08/09/2017    Orientation RESPIRATION BLADDER Height & Weight     Self, Time, Situation, Place  O2(3L o2) Continent Weight: (!) 301 lb 12.8 oz (136.9 kg) Height:  5' 5"  (165.1 cm)  BEHAVIORAL SYMPTOMS/MOOD NEUROLOGICAL BOWEL NUTRITION STATUS      Continent Diet(Clear liquid (To be advanced))  AMBULATORY STATUS COMMUNICATION OF NEEDS Skin   Extensive Assist Verbally Normal                       Personal Care Assistance Level of Assistance  Bathing, Feeding, Dressing Bathing Assistance: Limited assistance Feeding assistance: Independent Dressing Assistance: Limited assistance     Functional Limitations Info  Sight, Hearing, Speech Sight Info: Adequate Hearing Info: Adequate Speech Info: Adequate    SPECIAL CARE FACTORS FREQUENCY  PT (By licensed PT), OT (By licensed OT)     PT Frequency: Up to 5X per week OT Frequency: Up to 3X per week            Contractures Contractures Info: Not present     Additional Factors Info  Code Status, Allergies Code Status Info: Full Allergies Info: No Known Allergies           Current Medications (09/01/2017):  This is the current hospital active medication list Current Facility-Administered Medications  Medication Dose Route Frequency Provider Last Rate Last Dose  . acetaminophen (TYLENOL) tablet 1,000 mg  1,000 mg Oral Q6H Vickie Epley, MD   1,000 mg at 09/01/17 1223  . albuterol (PROVENTIL) (2.5 MG/3ML) 0.083% nebulizer solution 2.5 mg  2.5 mg Nebulization Q3H PRN Wilhelmina Mcardle, MD   2.5 mg at 08/29/17 1639  . alum & mag hydroxide-simeth (MAALOX/MYLANTA) 200-200-20 MG/5ML suspension 15 mL  15 mL Oral Q4H PRN Pabon, Diego F, MD   15 mL at 09/01/17 1052  . Ampicillin-Sulbactam (UNASYN) 3 g in sodium chloride 0.9 % 100 mL IVPB  3 g Intravenous Q6H Fritzi Mandes, MD   Stopped at 09/01/17 1127  . aspirin EC tablet 81 mg  81 mg Oral Daily Pabon, [place] F, MD   81 mg at 09/01/17 1104  . atorvastatin (LIPITOR) tablet 80 mg  80 mg Oral Daily Pabon, [place] F, MD   80 mg at 09/01/17 1054  . bisacodyl (DULCOLAX) suppository 10 mg  10 mg Rectal Daily PRN Pabon, Diego F, MD      . chlorhexidine gluconate (MEDLINE KIT) (PERIDEX) 0.12 % solution 15 mL  15 mL Mouth Rinse BID Awilda Bill, NP   15 mL at 09/01/17 0757  . enoxaparin (LOVENOX)  injection 40 mg  40 mg Subcutaneous Q24H Fritzi Mandes, MD   40 mg at 09/01/17 1054  . famotidine (PEPCID) IVPB 20 mg premix  20 mg Intravenous Q12H Vickie Epley, MD   Stopped at 09/01/17 1212  . TPN (CLINIMIX-E) Adult   Intravenous Continuous TPN Salary, Avel Peace, MD       And  . Fat emulsion 20 % infusion 250 mL  250 mL Intravenous Continuous TPN Salary, Montell D, MD      . feeding supplement (BOOST / RESOURCE BREEZE) liquid 1 Container  1 Container Oral TID BM Vickie Epley, MD   1 Container at 09/01/17 1224  . fluticasone (FLONASE) 50 MCG/ACT nasal spray 1 spray  1 spray Each Nare Daily PRN Pabon, Diego  F, MD      . hydrALAZINE (APRESOLINE) injection 10 mg  10 mg Intravenous Q4H PRN Caroleen Hamman F, MD   10 mg at 08/05/2017 0534  . HYDROmorphone (DILAUDID) injection 0.5 mg  0.5 mg Intravenous Q6H PRN Sudini, Srikar, MD      . insulin aspart (novoLOG) injection 0-9 Units  0-9 Units Subcutaneous Q4H Salary, Montell D, MD   1 Units at 09/01/17 0502  . ketorolac (TORADOL) 30 MG/ML injection 15 mg  15 mg Intravenous Q6H Vickie Epley, MD   15 mg at 09/01/17 1222  . levothyroxine (SYNTHROID, LEVOTHROID) tablet 75 mcg  75 mcg Oral QAC breakfast Pabon, [place] F, MD   75 mcg at 09/01/17 0751  . metoprolol tartrate (LOPRESSOR) tablet 100 mg  100 mg Oral BID Pabon, [place] F, MD   100 mg at 09/01/17 1055  . ondansetron (ZOFRAN) tablet 4 mg  4 mg Oral Q6H PRN Pabon, Diego F, MD       Or  . ondansetron (ZOFRAN) injection 4 mg  4 mg Intravenous Q6H PRN Jules Husbands, MD   4 mg at 08/27/17 2048  . sertraline (ZOLOFT) tablet 100 mg  100 mg Oral Daily Pabon, [place] F, MD   100 mg at 09/01/17 1054  . sodium chloride flush (NS) 0.9 % injection 10-40 mL  10-40 mL Intracatheter Q12H Salary, Montell D, MD   10 mL at 09/01/17 1057  . sodium chloride flush (NS) 0.9 % injection 10-40 mL  10-40 mL Intracatheter PRN Salary, Avel Peace, MD      . TPN (CLINIMIX-E) Adult   Intravenous Continuous TPN Hillary Bow, MD 83 mL/hr at 08/31/17 2313    . traMADol (ULTRAM) tablet 50 mg  50 mg Oral Q6H PRN Sudini, Alveta Heimlich, MD      . vancomycin (VANCOCIN) 1,500 mg in sodium chloride 0.9 % 500 mL IVPB  1,500 mg Intravenous Q24H Ramond Dial, RPH   Stopped at 08/31/17 2048     Discharge Medications: Please see discharge summary for a list of discharge medications.  Relevant Imaging Results:  Relevant Lab Results:   Additional Information SS# [id number]  Zettie Pho, LCSW

## 2017-09-01 NOTE — Progress Notes (Signed)
Alicia Antigua, MD [address] [place], [place], [place], [address] [address], [place], [place], [place], [address] Phone: [phone]  Fax: [phone]   Subjective: I was asked to see the patient today at the request of Dr. Darvin Conley.  Patient had one episode of emesis that was green, bile-like in color about 2 hours ago.  And then about an hour ago, coughing with sputum production that was bile like in color.  No hematemesis.  Dr. Darvin Conley recommended abdominal x-ray, but family wanted to speak to GI before getting an abdominal x-ray.   Objective: Exam: Vital signs in last 24 hours: Vitals:   09/01/17 0441 09/01/17 0453 09/01/17 1055 09/01/17 1232  BP: (!) 127/43 (!) 153/49 (!) 162/54 (!) 159/102  Pulse: 76 75 73 74  Resp: 20   (!) 22  Temp: 97.8 F (36.6 C)     TempSrc: Oral     SpO2: 94%   96%  Weight:  (!) 301 lb 12.8 oz (136.9 kg)    Height:       Weight change: 10 lb 12.7 oz (4.895 kg)  Intake/Output Summary (Last 24 hours) at 09/01/2017 1528 Last data filed at 09/01/2017 1057 Gross per 24 hour  Intake 2461.02 ml  Output 1115 ml  Net 1346.02 ml    General: No acute distress, AAO x3 Abd: Soft, NT/ND, No HSM Skin: Warm, no rashes Neck: Supple, Trachea midline   Lab Results: Lab Results  Component Value Date   WBC 21.0 (H) 09/01/2017   HGB 9.7 (L) 09/01/2017   HCT 30.3 (L) 09/01/2017   MCV 87.4 09/01/2017   PLT 401 09/01/2017   Micro Results: Recent Results (from the past 240 hour(s))  C difficile quick scan w PCR reflex     Status: None   Collection Time: 08/22/17  4:43 PM  Result Value Ref Range Status   C Diff antigen NEGATIVE NEGATIVE Final   C Diff toxin NEGATIVE NEGATIVE Final   C Diff interpretation No C. difficile detected.  Final    Comment: Performed at [facility], [address] [place], [place] [address]  Gastrointestinal Panel by PCR , Stool     Status: None   Collection Time: 08/22/17  4:43 PM  Result Value Ref  Range Status   Campylobacter species NOT DETECTED NOT DETECTED Final   Plesimonas shigelloides NOT DETECTED NOT DETECTED Final   Salmonella species NOT DETECTED NOT DETECTED Final   Yersinia enterocolitica NOT DETECTED NOT DETECTED Final   Vibrio species NOT DETECTED NOT DETECTED Final   Vibrio cholerae NOT DETECTED NOT DETECTED Final   Enteroaggregative E coli (EAEC) NOT DETECTED NOT DETECTED Final   Enteropathogenic E coli (EPEC) NOT DETECTED NOT DETECTED Final   Enterotoxigenic E coli (ETEC) NOT DETECTED NOT DETECTED Final   Shiga like toxin producing E coli (STEC) NOT DETECTED NOT DETECTED Final   Shigella/Enteroinvasive E coli (EIEC) NOT DETECTED NOT DETECTED Final   Cryptosporidium NOT DETECTED NOT DETECTED Final   Cyclospora cayetanensis NOT DETECTED NOT DETECTED Final   Entamoeba histolytica NOT DETECTED NOT DETECTED Final   Giardia lamblia NOT DETECTED NOT DETECTED Final   Adenovirus F40/41 NOT DETECTED NOT DETECTED Final   Astrovirus NOT DETECTED NOT DETECTED Final   Norovirus GI/GII NOT DETECTED NOT DETECTED Final   Rotavirus A NOT DETECTED NOT DETECTED Final   Sapovirus (I, II, IV, and V) NOT DETECTED NOT DETECTED Final    Comment: Performed at [facility], [place],  [place], [place] [address]  MRSA PCR Screening     Status: None   Collection Time: 08/30/17  3:45 PM  Result Value Ref Range Status   MRSA by PCR NEGATIVE NEGATIVE Final    Comment:        The GeneXpert MRSA Assay (FDA approved for NASAL specimens only), is one component of a comprehensive MRSA colonization surveillance program. It is not intended to diagnose MRSA infection nor to guide or monitor treatment for MRSA infections. Performed at [facility], [address] [place], [place] [address]    Studies/Results: No results found. Medications:  Scheduled Meds: . acetaminophen  1,000 mg Oral Q6H  . aspirin EC  81 mg Oral Daily  . atorvastatin  80 mg Oral Daily  .  chlorhexidine gluconate (MEDLINE KIT)  15 mL Mouth Rinse BID  . enoxaparin (LOVENOX) injection  40 mg Subcutaneous Q24H  . feeding supplement  1 Container Oral TID BM  . insulin aspart  0-9 Units Subcutaneous Q4H  . ketorolac  15 mg Intravenous Q6H  . levothyroxine  75 mcg Oral QAC breakfast  . metoprolol tartrate  100 mg Oral BID  . sertraline  100 mg Oral Daily  . sodium chloride flush  10-40 mL Intracatheter Q12H   Continuous Infusions: . ampicillin-sulbactam (UNASYN) IV Stopped (09/01/17 1127)  . famotidine (PEPCID) IV Stopped (09/01/17 1212)  . [place] KitchenTPN (CLINIMIX-E) Adult     And  . Fat emulsion    . Marland KitchenTPN (CLINIMIX-E) Adult 83 mL/hr at 08/31/17 2313  . vancomycin Stopped (08/31/17 2048)   PRN Meds:.albuterol, alum & mag hydroxide-simeth, bisacodyl, fluticasone, hydrALAZINE, HYDROmorphone (DILAUDID) injection, ondansetron **OR** ondansetron (ZOFRAN) IV, sodium chloride flush, traMADol   Assessment: Active Problems:   Abdominal pain   Ileus (HCC)   Large bowel obstruction (HCC)   Abdominal carcinomatosis (HCC)    Plan: Abdominal exam is reassuring However, due to her complicated hospital stay, I agree with Dr. Boykin Conley recommendation of obtaining abdominal x-ray, to evaluate bowel gas pattern  This would also allow [place] to ensure there is no ileus, and if she continues to have nausea and vomiting, she may need an NG tube for decompression if ileus is present, or if she continues to have emesis, and NG tube would help prevent aspiration as well  This was discussed with the family in detail. They are agreeable to a portable x-ray, which I discussed with Dr. Darvin Conley, and he will be ordering them  Would recommend minimizing narcotics  If obstruction is present in x-ray, or significant bowel distention, would recommend calling surgery  Dr. Alice Conley is back on service tomorrow, and patient will be followed by him tomorrow.   LOS: 11 days   Alicia Antigua, MD 09/01/2017, 3:28  PM

## 2017-09-01 NOTE — Progress Notes (Signed)
[place] [facility] [place] NAME: Alicia Conley    MR#:  [id number]  DATE OF BIRTH:  [birth year]  SUBJECTIVE:   Patient extremely restless due to pain. Family at bedside  REVIEW OF SYSTEMS:  Review of Systems  Constitutional: Negative for chills, fever and weight loss.  HENT: Negative for ear discharge, ear pain and nosebleeds.   Eyes: Negative for blurred vision, pain and discharge.  Respiratory: Negative for sputum production, shortness of breath, wheezing and stridor.   Cardiovascular: Negative for chest pain, palpitations, orthopnea and PND.  Gastrointestinal: Negative for abdominal pain, diarrhea, nausea and vomiting.  Genitourinary: Negative for frequency and urgency.  Musculoskeletal: Negative for back pain and joint pain.  Neurological: Positive for weakness. Negative for sensory change, speech change and focal weakness.  Psychiatric/Behavioral: Negative for depression and hallucinations. The patient is not nervous/anxious.    DRUG ALLERGIES:  No Known Allergies  VITALS:  Blood pressure (!) 159/102, pulse 74, temperature 97.8 F (36.6 C), temperature source Oral, resp. rate (!) 22, height 5\' 5"  (1.651 m), weight (!) 136.9 kg (301 lb 12.8 oz), SpO2 96 %.  PHYSICAL EXAMINATION:  GENERAL:  77 y.o.-year-old patient lying in the bed. obese EYES: Pupils equal, round, reactive to light and accommodation. No scleral icterus. Extraocular muscles intact.  HEENT: Head atraumatic, normocephalic. Oropharynx and nasopharynx clear.  NECK:  Supple, no jugular venous distention. No thyroid enlargement, no tenderness.  LUNGS: Normal breath sounds bilaterally, no wheezing, rales,rhonchi or crepitation. No use of accessory muscles of respiration.  CARDIOVASCULAR: S1, S2 normal. No murmurs, rubs, or gallops.  ABDOMEN: Soft.  Wound VAC in place EXTREMITIES: ++ pedal edema,no cyanosis, or clubbing.  NEUROLOGIC: Cranial nerves II through XII are intact.  Muscle strength 5/5 in all extremities. Sensation intact. Gait not checked.  PSYCHIATRIC: The patient is alert and awake.  Restless SKIN: No obvious rash, lesion, or ulcer.   Physical Exam LABORATORY PANEL:   CBC Recent Labs  Lab 09/01/17 0649  WBC 21.0*  HGB 9.7*  HCT 30.3*  PLT 401   ------------------------------------------------------------------------------------------------------------------  Chemistries  Recent Labs  Lab 08/29/17 0516  09/01/17 0649  NA 138   < > 141  K 4.2   < > 3.7  CL 113*   < > 110  CO2 20*   < > 22  GLUCOSE 155*   < > 124*  BUN 32*   < > 51*  CREATININE 1.15*   < > 1.02*  CALCIUM 7.8*   < > 8.2*  MG 2.0   < > 2.2  AST 29  --   --   ALT 15  --   --   ALKPHOS 65  --   --   BILITOT 0.4  --   --    < > = values in this interval not displayed.   ------------------------------------------------------------------------------------------------------------------  Cardiac Enzymes No results for input(s): TROPONINI in the last 168 hours. ------------------------------------------------------------------------------------------------------------------  RADIOLOGY:  No results found.  ASSESSMENT AND PLAN:  77 year old female patient with history of hypertension, hyperlipidemia, hypothyroidism referred from GI clinic for direct admission for abdominal discomfort.  *Acute Peritoneal carcinomatosis Etiology unknown--? Gyn origin? -Status post flexible sigmoidoscopy noted for internal hemorrhoids/diverticulosis August 23, 2017 -Status post attempted diverting colostomy on August 25, 2017 which was aborted given severe carcinomatosis-sample  taken and sent to lab for pathology review -s/p colonic stent placement  by gastroenterology/Dr. Alice Reichert -pt intubated since aspirated prior to procedure. Now extubated and on 2  liter [place]  *Hypokalemia and hypomagnesemia Resolved  *Chronic benign essential hypertension Continue current regiment  *  ChronicHyperlipidemia Resume statinupon discharge  Prognosis poor -palliative care consulted  On TPN  Long discussion with family regarding pain medications.  Will start oral tramadol.  Limit IV Dilaudid.  Family believes patient is exaggerating her pain symptoms when healthcare providers are present in the room.  All the records are reviewed and case discussed with Care Management/Social Worker Management plans discussed with the patient, family and they are in agreement.  CODE STATUS: Full  TOTAL TIME TAKING CARE OF THIS PATIENT: 25 minutes.   Neita Carp M.D on 09/01/2017   Between 7am to 6pm - Pager - [phone]  After 6pm go to [email or web address] - password [email or web address] [facility]  Office  [phone]  CC: Primary care physician; [name], MD  Note: This dictation was prepared with Dragon dictation along with smaller phrase technology. Any transcriptional errors that result from this process are unintentional.

## 2017-09-02 DIAGNOSIS — C3490 Malignant neoplasm of unspecified part of unspecified bronchus or lung: Secondary | ICD-10-CM

## 2017-09-02 DIAGNOSIS — R062 Wheezing: Secondary | ICD-10-CM

## 2017-09-02 DIAGNOSIS — I10 Essential (primary) hypertension: Secondary | ICD-10-CM

## 2017-09-02 DIAGNOSIS — E079 Disorder of thyroid, unspecified: Secondary | ICD-10-CM

## 2017-09-02 DIAGNOSIS — Z7189 Other specified counseling: Secondary | ICD-10-CM

## 2017-09-02 DIAGNOSIS — C787 Secondary malignant neoplasm of liver and intrahepatic bile duct: Secondary | ICD-10-CM

## 2017-09-02 LAB — COMPREHENSIVE METABOLIC PANEL
ALT: 20 U/L (ref 0–44)
AST: 45 U/L — AB (ref 15–41)
Albumin: 1.9 g/dL — ABNORMAL LOW (ref 3.5–5.0)
Alkaline Phosphatase: 135 U/L — ABNORMAL HIGH (ref 38–126)
Anion gap: 7 (ref 5–15)
BUN: 49 mg/dL — ABNORMAL HIGH (ref 8–23)
CHLORIDE: 113 mmol/L — AB (ref 98–111)
CO2: 22 mmol/L (ref 22–32)
CREATININE: 1.07 mg/dL — AB (ref 0.44–1.00)
Calcium: 8.1 mg/dL — ABNORMAL LOW (ref 8.9–10.3)
GFR calc Af Amer: 57 mL/min — ABNORMAL LOW (ref >60)
GFR calc non Af Amer: 49 mL/min — ABNORMAL LOW (ref >60)
Glucose, Bld: 118 mg/dL — ABNORMAL HIGH (ref 70–99)
POTASSIUM: 3.9 mmol/L (ref 3.5–5.1)
SODIUM: 142 mmol/L (ref 135–145)
Total Bilirubin: 0.6 mg/dL (ref 0.3–1.2)
Total Protein: 5.5 g/dL — ABNORMAL LOW (ref 6.5–8.1)

## 2017-09-02 LAB — GLUCOSE, CAPILLARY
GLUCOSE-CAPILLARY: 107 mg/dL — AB (ref 70–99)
GLUCOSE-CAPILLARY: 143 mg/dL — AB (ref 70–99)
GLUCOSE-CAPILLARY: 187 mg/dL — AB (ref 70–99)
Glucose-Capillary: 104 mg/dL — ABNORMAL HIGH (ref 70–99)
Glucose-Capillary: 116 mg/dL — ABNORMAL HIGH (ref 70–99)
Glucose-Capillary: 143 mg/dL — ABNORMAL HIGH (ref 70–99)
Glucose-Capillary: 146 mg/dL — ABNORMAL HIGH (ref 70–99)

## 2017-09-02 LAB — TRIGLYCERIDES: Triglycerides: 196 mg/dL — ABNORMAL HIGH (ref <150)

## 2017-09-02 LAB — CBC
HCT: 30.6 % — ABNORMAL LOW (ref 35.0–47.0)
Hemoglobin: 9.9 g/dL — ABNORMAL LOW (ref 12.0–16.0)
MCH: 28.1 pg (ref 26.0–34.0)
MCHC: 32.2 g/dL (ref 32.0–36.0)
MCV: 87 fL (ref 80.0–100.0)
PLATELETS: 420 10*3/uL (ref 150–440)
RBC: 3.51 MIL/uL — AB (ref 3.80–5.20)
RDW: 15.8 % — ABNORMAL HIGH (ref 11.5–14.5)
WBC: 21.5 10*3/uL — ABNORMAL HIGH (ref 3.6–11.0)

## 2017-09-02 LAB — DIFFERENTIAL
Basophils Absolute: 0 10*3/uL (ref 0–0.1)
Basophils Relative: 0 %
EOS ABS: 0.3 10*3/uL (ref 0–0.7)
EOS PCT: 2 %
Lymphocytes Relative: 3 %
Lymphs Abs: 0.7 10*3/uL — ABNORMAL LOW (ref 1.0–3.6)
Monocytes Absolute: 1.2 10*3/uL — ABNORMAL HIGH (ref 0.2–0.9)
Monocytes Relative: 6 %
NEUTROS PCT: 89 %
Neutro Abs: 19.2 10*3/uL — ABNORMAL HIGH (ref 1.4–6.5)

## 2017-09-02 LAB — PHOSPHORUS: PHOSPHORUS: 5.5 mg/dL — AB (ref 2.5–4.6)

## 2017-09-02 LAB — SURGICAL PATHOLOGY

## 2017-09-02 LAB — MAGNESIUM: MAGNESIUM: 2.3 mg/dL (ref 1.7–2.4)

## 2017-09-02 MED ORDER — IPRATROPIUM-ALBUTEROL 0.5-2.5 (3) MG/3ML IN SOLN
3.0000 mL | Freq: Four times a day (QID) | RESPIRATORY_TRACT | Status: DC
  Administered 2017-09-02 – 2017-09-07 (×18): 3 mL via RESPIRATORY_TRACT
  Filled 2017-09-02 (×20): qty 3

## 2017-09-02 MED ORDER — METHYLPREDNISOLONE SODIUM SUCC 125 MG IJ SOLR
60.0000 mg | INTRAMUSCULAR | Status: AC
  Administered 2017-09-02: 60 mg via INTRAVENOUS
  Filled 2017-09-02: qty 2

## 2017-09-02 MED ORDER — METHYLPREDNISOLONE SODIUM SUCC 125 MG IJ SOLR
60.0000 mg | Freq: Two times a day (BID) | INTRAMUSCULAR | Status: AC
  Administered 2017-09-02 – 2017-09-05 (×6): 60 mg via INTRAVENOUS
  Filled 2017-09-02 (×6): qty 2

## 2017-09-02 MED ORDER — FAT EMULSION PLANT BASED 20 % IV EMUL
250.0000 mL | INTRAVENOUS | Status: AC
  Administered 2017-09-02: 250 mL via INTRAVENOUS
  Filled 2017-09-02 (×2): qty 250

## 2017-09-02 MED ORDER — FUROSEMIDE 10 MG/ML IJ SOLN
60.0000 mg | Freq: Once | INTRAMUSCULAR | Status: AC
Start: 2017-09-02 — End: 2017-09-02
  Administered 2017-09-02: 60 mg via INTRAVENOUS
  Filled 2017-09-02: qty 8

## 2017-09-02 MED ORDER — KETOROLAC TROMETHAMINE 30 MG/ML IJ SOLN
15.0000 mg | Freq: Three times a day (TID) | INTRAMUSCULAR | Status: DC | PRN
  Administered 2017-09-02 – 2017-09-04 (×3): 15 mg via INTRAVENOUS
  Filled 2017-09-02 (×3): qty 1

## 2017-09-02 MED ORDER — TRACE MINERALS CR-CU-MN-SE-ZN 10-1000-500-60 MCG/ML IV SOLN
INTRAVENOUS | Status: AC
  Administered 2017-09-02: 18:00:00 via INTRAVENOUS
  Filled 2017-09-02: qty 2000

## 2017-09-02 MED ORDER — TRAMADOL HCL 50 MG PO TABS
50.0000 mg | ORAL_TABLET | Freq: Four times a day (QID) | ORAL | Status: DC
  Administered 2017-09-03 – 2017-09-06 (×8): 50 mg via ORAL
  Filled 2017-09-02 (×11): qty 1

## 2017-09-02 NOTE — Progress Notes (Signed)
[facility] [place] NAME: Alicia Conley    MR#:  [id number]  DATE OF BIRTH:  Nov 16, 1940  SUBJECTIVE:   Poor sleep and confused.  Wheezing today. Poor historian.  Husband and grand daughter at bedside  REVIEW OF SYSTEMS:  Review of Systems  Constitutional: Negative for chills, fever and weight loss.  HENT: Negative for ear discharge, ear pain and nosebleeds.   Eyes: Negative for blurred vision, pain and discharge.  Respiratory: Negative for sputum production, shortness of breath, wheezing and stridor.   Cardiovascular: Negative for chest pain, palpitations, orthopnea and PND.  Gastrointestinal: Negative for abdominal pain, diarrhea, nausea and vomiting.  Genitourinary: Negative for frequency and urgency.  Musculoskeletal: Negative for back pain and joint pain.  Neurological: Positive for weakness. Negative for sensory change, speech change and focal weakness.  Psychiatric/Behavioral: Negative for depression and hallucinations. The patient is not nervous/anxious.    DRUG ALLERGIES:  No Known Allergies  VITALS:  Blood pressure (!) 173/104, pulse 83, temperature 98.2 F (36.8 C), temperature source Oral, resp. rate (!) 25, height 5\' 5"  (1.651 m), weight 134.4 kg (296 lb 4.8 oz), SpO2 100 %.  PHYSICAL EXAMINATION:  GENERAL:  77 y.o.-year-old patient lying in the bed. obese EYES: Pupils equal, round, reactive to light and accommodation. No scleral icterus. Extraocular muscles intact.  HEENT: Head atraumatic, normocephalic. Oropharynx and nasopharynx clear.  NECK:  Supple, no jugular venous distention. No thyroid enlargement, no tenderness.  LUNGS: Bilateral wheezing and crackles CARDIOVASCULAR: S1, S2 normal. No murmurs, rubs, or gallops.  ABDOMEN: Soft.  Wound VAC in place EXTREMITIES: ++ pedal edema,no cyanosis, or clubbing.  NEUROLOGIC: Cranial nerves II through XII are intact. Muscle strength 5/5 in all extremities. Sensation  intact. Gait not checked.  PSYCHIATRIC: The patient is alert and awake.  Restless SKIN: No obvious rash, lesion, or ulcer.   Physical Exam LABORATORY PANEL:   CBC Recent Labs  Lab 09/02/17 0305  WBC 21.5*  HGB 9.9*  HCT 30.6*  PLT 420   ------------------------------------------------------------------------------------------------------------------  Chemistries  Recent Labs  Lab 09/02/17 0305  NA 142  K 3.9  CL 113*  CO2 22  GLUCOSE 118*  BUN 49*  CREATININE 1.07*  CALCIUM 8.1*  MG 2.3  AST 45*  ALT 20  ALKPHOS 135*  BILITOT 0.6   ------------------------------------------------------------------------------------------------------------------  Cardiac Enzymes No results for input(s): TROPONINI in the last 168 hours. ------------------------------------------------------------------------------------------------------------------  RADIOLOGY:  Dg Abd 1 View  Result Date: 09/01/2017 CLINICAL DATA:  Carcinomatosis with large-bowel obstruction. Bilious vomiting. EXAM: ABDOMEN - 1 VIEW COMPARISON:  CT scan August 29, 2017 FINDINGS: There is a feathery pattern of gas identified over the abdomen. Some of this gas extend outside of the abdominal cavity. Based on previous CT imaging, I suspect this represents increasing gas in the subcutaneous tissues. This significantly obscures evaluation of the abdomen. No convincing evidence of free air. A stent and tube again project over the pelvis/lower abdomen. Contrast is seen in the rectum. Hernia mesh is noted. No definitive distended bowel to suggest ongoing obstruction. IMPRESSION: 1. The feathery pattern of air diffusely over the lower chest and abdomen suggests increasing air in the subcutaneous fat. This significantly obscures the remainder of the abdomen but there is no definitive bowel dilatation to suggest ongoing obstruction. Electronically Signed   By: Dorise Bullion III M.D   On: 09/01/2017 18:50    ASSESSMENT AND PLAN:   77 year old female patient with history of hypertension, hyperlipidemia, hypothyroidism referred  from GI clinic for direct admission for abdominal discomfort.  * Acute hypoxic resp failure Diffuse wheezing and crackles Aspiration? Bronchitis? Pulm edema Suggested CXR to family but they do not want it as she has had many xrays by now. Explained we will have to treat empirically in that case. They agree Will start nebs, steroids. Lasix 60mg  x 1.  *Acute Peritoneal carcinomatosis Etiology unknown--? Waiting for oncology input -Status post flexible sigmoidoscopy noted for internal hemorrhoids/diverticulosis August 23, 2017 -Status post attempted diverting colostomy on August 25, 2017 which was aborted given severe carcinomatosis-sample  taken and sent to lab for pathology review -s/p colonic stent placement  by gastroenterology/Dr. Alice Reichert -pt intubated since aspirated prior to procedure. Now extubated and on 2 liter   *Hypokalemia and hypomagnesemia Resolved  *Chronic benign essential hypertension Continue current regiment  * ChronicHyperlipidemia Resume statinupon discharge  Prognosis poor -palliative care consulted  On TPN   Tried to explain to family we have to wait for final biopsy results for treatment options and to see what prognosis would be. They adamantly tell me that this is very slow progressing tumor which is treatable and want to get patient stronger so that she can get chemo.  Will wait for oncology input  All the records are reviewed and case discussed with Care Management/Social Worker Management plans discussed with the patient, family and they are in agreement.  CODE STATUS: Full  TOTAL TIME TAKING CARE OF THIS PATIENT: 25 minutes.   Neita Carp M.D on 09/02/2017   Between 7am to 6pm - Pager - [phone]  After 6pm go to [email or web address] - password [email or web address] [facility]  Office  [phone]  CC: Primary care physician;  [name], MD  Note: This dictation was prepared with Dragon dictation along with smaller phrase technology. Any transcriptional errors that result from this process are unintentional.

## 2017-09-02 NOTE — Care Management Important Message (Signed)
Copy of signed IM left with patient in room.  

## 2017-09-02 NOTE — Progress Notes (Signed)
PT Cancellation Note  Patient Details Name: Alicia Conley MRN: [id number] DOB: [birth year]   Cancelled Treatment:    Reason Eval/Treat Not Completed: (Treatment session attempted.  Oncologist at bedside with patient/family upon arrival to room; transitioned to family meeting with husband, granddaughter outside of the room.  Will hold PT at this time and re-attempt next date as goals of care clarified and patient appropriate/available for treatment.)   Kahmya Pinkham H. Owens Shark, PT, DPT, NCS 09/02/17, 4:38 PM [phone]

## 2017-09-02 NOTE — Progress Notes (Signed)
[facility] NOTE   Pharmacy Consult for TPN Indication: Inadequate oral intake related to poor appetite, nausea, vomiting   Patient Measurements: Height: 5\' 5"  (165.1 cm) Weight: 296 lb 4.8 oz (134.4 kg) IBW/kg (Calculated) : 57 TPN AdjBW (KG): 74.5 Body mass index is 49.31 kg/m.  Assessment:  77 yo female with distal bowel obstruction.   TPN Access: central line in place TPN start date: 08/27/17 Nutritional Goals (per RD recommendation on 08/27/17): KCal: 2014 Protein: 100g Fluid: 2292  Goal TPN rate is 83 ml/hr  Plan:  Patient phos has become elevated, RD recommends removing electrolytes. Will remove electrolytes from TPN and supplement outside the the TPN tomorrow morning based on need.   Clinimix plain 5/15 TPN at 66mL/hr. 20% lipids at 58mL/hr for 12 hours.  This TPN provides 100 g of protein, 150 g of dextrose, and 60 g of lipids which provides 1776 kCals per day, meeting 90% of patient needs Electrolytes not in TPN: Phos elevated at 5.5 - monitor/reevaluate in am Add MVI, trace elements SSI/24 hrs: 5 units Clear liquid diet ordered and patient tolerating so far. Monitor TPN labs  Donna Christen Darsh Vandevoort, PharmD 09/02/2017,11:02 AM

## 2017-09-02 NOTE — Progress Notes (Signed)
CC: S/P Colonic tube decompression and stent for met carcinomatosis Subjective: Delirious WBC still high   Objective: Vital signs in last 24 hours: Temp:  [97.8 F (36.6 C)-98.4 F (36.9 C)] 98.2 F (36.8 C) (07/01 1306) Pulse Rate:  [78-93] 78 (07/01 1306) Resp:  [20-25] 20 (07/01 1306) BP: (109-215)/(59-205) 181/70 (07/01 1306) SpO2:  [96 %-100 %] 99 % (07/01 1306) Weight:  [134.4 kg (296 lb 4.8 oz)] 134.4 kg (296 lb 4.8 oz) (07/01 0417) Last BM Date: 09/01/17  Intake/Output from previous day: 06/30 0701 - 07/01 0700 In: 2576 [P.O.:360; I.V.:1215; IV Piggyback:1001] Out: 6979 [Urine:1450; Drains:95] Intake/Output this shift: Total I/O In: 306 [P.O.:180; I.V.:126] Out: 30 [Drains:30]  Physical exam: Chest: wheezes Abd: I removed some staples and let the wound open, placed wet/dry. No evidence of necrotizing infection. No purulence. Large defect. Cellulitis on left torso. Tube in place  Lab Results: CBC  Recent Labs    09/01/17 0649 09/02/17 0305  WBC 21.0* 21.5*  HGB 9.7* 9.9*  HCT 30.3* 30.6*  PLT 401 420   BMET Recent Labs    09/01/17 0649 09/02/17 0305  NA 141 142  K 3.7 3.9  CL 110 113*  CO2 22 22  GLUCOSE 124* 118*  BUN 51* 49*  CREATININE 1.02* 1.07*  CALCIUM 8.2* 8.1*   PT/INR No results for input(s): LABPROT, INR in the last 72 hours. ABG No results for input(s): PHART, HCO3 in the last 72 hours.  Invalid input(s): PCO2, PO2  Studies/Results: Dg Abd 1 View  Result Date: 09/01/2017 CLINICAL DATA:  Carcinomatosis with large-bowel obstruction. Bilious vomiting. EXAM: ABDOMEN - 1 VIEW COMPARISON:  CT scan August 29, 2017 FINDINGS: There is a feathery pattern of gas identified over the abdomen. Some of this gas extend outside of the abdominal cavity. Based on previous CT imaging, I suspect this represents increasing gas in the subcutaneous tissues. This significantly obscures evaluation of the abdomen. No convincing evidence of free air. A stent  and tube again project over the pelvis/lower abdomen. Contrast is seen in the rectum. Hernia mesh is noted. No definitive distended bowel to suggest ongoing obstruction. IMPRESSION: 1. The feathery pattern of air diffusely over the lower chest and abdomen suggests increasing air in the subcutaneous fat. This significantly obscures the remainder of the abdomen but there is no definitive bowel dilatation to suggest ongoing obstruction. Electronically Signed   By: Dorise Bullion III M.D   On: 09/01/2017 18:50    Anti-infectives: Anti-infectives (From admission, onward)   Start     Dose/Rate Route Frequency Ordered Stop   08/31/17 1800  vancomycin (VANCOCIN) 1,500 mg in sodium chloride 0.9 % 500 mL IVPB     1,500 mg 250 mL/hr over 120 Minutes Intravenous Every 24 hours 08/31/17 1044     08/31/17 1200  vancomycin (VANCOCIN) IVPB 1000 mg/200 mL premix     1,000 mg 200 mL/hr over 60 Minutes Intravenous  Once 08/31/17 1044 08/31/17 1202   08/30/17 1045  Ampicillin-Sulbactam (UNASYN) 3 g in sodium chloride 0.9 % 100 mL IVPB     3 g 200 mL/hr over 30 Minutes Intravenous Every 6 hours 08/30/17 1034     08/24/2017 1800  piperacillin-tazobactam (ZOSYN) IVPB 3.375 g  Status:  Discontinued     3.375 g 12.5 mL/hr over 240 Minutes Intravenous Every 8 hours 09/01/2017 1637 08/26/17 0126   08/09/2017 1730  piperacillin-tazobactam (ZOSYN) IVPB 3.375 g  Status:  Discontinued     3.375 g 12.5 mL/hr over 240  Minutes Intravenous Every 8 hours 08/30/2017 1728 08/26/17 0136   08/06/2017 1445  piperacillin-tazobactam (ZOSYN) IVPB 3.375 g     3.375 g 100 mL/hr over 30 Minutes Intravenous  Once 08/24/2017 1437 08/26/2017 1516   08/24/17 1800  cefOXitin (MEFOXIN) 2 g in sodium chloride 0.9 % 100 mL IVPB  Status:  Discontinued     2 g 200 mL/hr over 30 Minutes Intravenous Every 6 hours 08/24/17 1606 08/16/2017 0844      Assessment/Plan:  Cellulitis and open wound Recommend wound vac broaden a/bs therapy No surgical  intervention, poor prognosis on a very difficult situation We will follow Family updated  Caroleen Hamman, MD, FACS  09/02/2017

## 2017-09-02 NOTE — Consult Note (Signed)
[place] Nurse wound consult note Assessment completed in [facility] 219A.  The patient has a drain tube a few centimeters above the incision site.  There is stool coming out from around the drain tube.  Area was cleansed and new split drain sponges were taped in place. Reason for Consult: NPWT to abdominal wound Wound type: Surgical incision Measurement: 2 cm x 12 cm x 6.3 cm.  One piece of black foam inserted into the wound.  A no-leak seal obtained.  The patient tolerated the procedure very well. Wound bed: Dry, yellow Drainage (amount, consistency, odor) Brown tinged drainage on the moistened gauze packing. Periwound: Erythematous, suggestive of cellulitis particularly on the patient's left side. Dressing procedure/placement/frequency: VAC dressing M-W-F Val Riles, RN, MSN, [facility], CNS-BC, pager [phone]

## 2017-09-02 NOTE — Progress Notes (Signed)
Daily Progress Note   Patient Name: Alicia Conley       Date: 09/02/2017 DOB: [birth year]  Age: 77 y.o. MRN#: [id number] Attending Physician: Hillary Bow, MD Primary Care Physician: [name], MD Admit Date: 08/10/2017  Reason for Consultation/Follow-up: Establishing goals of care  Subjective: Patient is lying in bed on her side with occasional moan. She is alert to family only. Unable to successfully identify place, date, time, or president. She is now suffering from delirium most likely. Granddaughter and husband at the bedside. Granddaughter reports she ate a good lunch without complication. Over the weekend there was some concern that she was throwing up, however, granddaughter states she was "just spitting up not actually vomiting". Family states they have asked for her not to receive any more dilaudid or morphine for pain because this is contributing to the delirium, confusion and they do not like her this way. Granddaughter tears up and states "she has been going through a rough time since surgery and it is hard to know how she was prior to the surgery and now" Attempted to allow her and the husband to elaborate more and allow them to recognize changes in her health during this hospitalization. Family verbalized that she seems to not be bouncing back as quickly as they would like. Granddaughter states she will get better and then it's chemo time and she will be much better. Attempted to discuss how her performance status would play a huge role in her receiving chemotherapy and how she would tolerate treatments. 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 They then began to question about PT coming to work with her today because they haven't  been in yet and should be coming twice per day. I also attempted to discuss her code status again on today, given family verbalized awareness that she was not improving. Husband and granddaughter continues to want full aggressive care, even if it leads to CPR and intubation again. Husband states "we have to give her a fighting chance and see if she will turn around " . They are still anxiously awaiting definitive results and options for plan of care by Oncology.  Patient and family knows to contact me with any questions or concerns.   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    Chart Reviewed.  Length of Stay: 12  Current Medications: Scheduled Meds:  . acetaminophen  1,000 mg Oral Q6H  . aspirin EC  81 mg Oral Daily  . atorvastatin  80 mg Oral Daily  . chlorhexidine gluconate (MEDLINE KIT)  15 mL Mouth Rinse BID  . enoxaparin (LOVENOX) injection  40 mg Subcutaneous Q24H  . feeding supplement  1 Container Oral TID BM  . insulin aspart  0-9 Units Subcutaneous Q4H  . ipratropium-albuterol  3 mL Nebulization Q6H  . levothyroxine  75 mcg Oral QAC breakfast  . methylPREDNISolone (SOLU-MEDROL) injection  60 mg Intravenous Q12H  . metoprolol tartrate  100 mg Oral BID  . sertraline  100 mg Oral Daily  . sodium chloride flush  10-40 mL Intracatheter Q12H  . traMADol  50 mg Oral Q6H    Continuous Infusions: . ampicillin-sulbactam (UNASYN) IV Stopped (09/02/17 1446)  . famotidine (PEPCID) IV Stopped (09/01/17 2158)  . Fat emulsion    . TPN (CLINIMIX) Adult without lytes    . Marland KitchenTPN (CLINIMIX-E) Adult 83 mL/hr at 09/01/17 1808  . vancomycin Stopped (08/31/17 2048)    PRN Meds: albuterol, alum & mag hydroxide-simeth, bisacodyl, fluticasone, hydrALAZINE, HYDROmorphone (DILAUDID) injection, ketorolac, ondansetron **OR** ondansetron (ZOFRAN) IV, sodium chloride flush  Physical Exam      Constitutional: Awake and alert to family only. Delirium. Vital signs are normal. She appears well-developed. She has a sickly appearance.  Cardiovascular: Normal rate, regular rhythm, normal heart sounds, intact distal pulses and normal pulses.  Pulmonary/Chest: Effort normal and breath sounds rhonchi + wheeze + Abdominal: Normal appearance. Bowel sounds are decreased. Abdominal dressing s/p palliative colonic stent with wound vac.  Neurological: She is alert to family only.  Skin: Right upper arm PICC line in place.  Psychiatric: Her speech is normal and behavior is normal. Judgment and thought content impaired.  Nursing note and vitals reviewed.  Vital Signs: BP (!) 181/70 (BP Location: Left Arm)   Pulse 78   Temp 98.2 F (36.8 C) (Oral)   Resp 20   Ht _0  (1.651 m)   Wt 134.4 kg (296 lb 4.8 oz)   SpO2 99%   BMI 49.31 kg/m  SpO2: SpO2: 99 % O2 Device: O2 Device: Nasal Cannula O2 Flow Rate: O2 Flow Rate (L/min): 3 L/min  Intake/output summary:   Intake/Output Summary (Last 24 hours) at 09/02/2017 1558 Last data filed at 09/02/2017 1430 Gross per 24 hour  Intake 2692 ml  Output 1300 ml  Net 1392 ml   LBM: Last BM Date: 09/01/17 Baseline Weight: Weight: 126.9 kg (279 lb 12.2 oz) Most recent weight: Weight: 134.4 kg (296 lb 4.8 oz)       Palliative Assessment/Data: PPS 30%    Patient Active Problem List   Diagnosis Date Noted  . Large bowel obstruction ([place])   . Abdominal carcinomatosis ([place])   . Abdominal pain 08/11/2017  . Ileus ([place]) 08/24/2017    Palliative Care Assessment & Plan   Patient Profile: 77 y.o. female admitted on 08/07/2017 from home with abdominal pain for the last 2.5 weeks. She has a past medical history of hyperlipidemia, hypertension, and hypothyroidism. She had been worked up with [facility] and CT abdomen revealed a partial distal colonic obstruction with a large amount of stool. She was seen at our ER on Saturday and was discharged after  evaluation. She continued to have discomfort and unable to eat  or drink fluids, which led her to see Dr. Alice Reichert (Gastroenterologist). He directly admitted her. She reported her last bowel movement was more than a week prior to admission. Since admission patient has received fluid and electrolyte replacement. She is s/p flexible sigmoidoscopy on 08/03/2017 noted for internal hemorrhoids and diverticulosis. An attempt was made for a diverting colostomy on 08/07/2017 which was aborted given severe carcinomatosis; biopsy was taken. She has been seen by Dr. Mike Gip, Oncologist to evaluate plan of care. Palliative Medicine team consulted for goals of care discussion.   Recommendations/Plan:  FULL CODE -at patient/family's request. Despite knowing the extent of her current condition. Family would like all aggressive measures.   Continue to treat the treatable. Patient and family is relying on improvement s/p stent placement, biopsy results, and have detailed conversation with Oncology regarding plan of care. 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 They are prepared to move forward with options and guidance from oncology. Continues to feel she will improve with therapy and will be able to have chemotherapy and improve quality of life.   Patient/family would not like to discuss or set up outpatient palliative vs. Hospice at this time. Will continue to have crucial conversations once they have more information on her condition in regards to Oncology. Family does endorse noticing she is not improving as much as they expected and she doesn't seem to be much better. Somewhat tearful today, but remains guarded with any decisions.   Would recommend venting gastrostomy tube at some point: as this will be beneficial in future to help with symptoms and medication. This is known to be of benefit with patients with known peritoneal carcinomatosis and management of the potential for high risk  malignant bowel obstructions.   If patient develops uncontrolled nausea would recommend scheduled reglan, zofran and compazine for support.   Also would recommend the use of octreotide, decadron, and high dose PPI or H2 antagonist if needed.   Palliative Medicine team will continue to support patient, family, and medical team during hospitalization.   Goals of Care and Additional Recommendations:  Limitations on Scope of Treatment: Full Scope Treatment  Code Status:    Code Status Orders  (From admission, onward)        Start     Ordered   08/05/2017 1732  Full code  Continuous     08/19/2017 1731    Code Status History    This patient has a current code status but no historical code status.       Prognosis:   Unable to determine-guarded to poor in the setting of poor po intake, TPN initiation, decreased mobility, advanced peritoneal carcinomatosis, metastatic ovarian and neuroendocrine tumor, partial bowel obstruction, unsuccessful attempt for diverting colostomy, obstipation, hypertension, delirium,and hyperlipidemia.   Discharge Planning: To Be Determined - would recommend at minimum outpatient Palliative if patient continues with aggressive measures (chemo/xrt).   Care plan was discussed with patient, family, and bedside RN.   Thank you for allowing the Palliative Medicine Team to assist in the care of this patient.   Total Time 35 min.  Prolonged Time Billed  NO       Greater than 50%  of this time was spent counseling and coordinating care related to the above assessment and plan.  Alda Lea, NP-BC Palliative Medicine Team  Phone: [phone] Fax: [phone] Pager: [phone] Amion: Bjorn Pippin   Please contact Palliative Medicine Team phone at [phone] for questions and concerns.

## 2017-09-02 NOTE — Progress Notes (Signed)
[facility]enterology Inpatient Progress Note  Subjective: Patient seen for f/u distal colonic obstruction from peritoneal carcinomatosis s/p laparotomy with JP drain. Has abdominal cellulitis from open wound initially made for colostomy; this was not technically feasible given frozen abdomen. Patient s/p flexible sigmoidoscopy with colonic stent placement on 08/09/2017. Patient had respiratory arrest with initial MAC anesthesia and required endotracheal intubation and mechanical ventilation; we had decided to proceed with the flexible sigmoidoscopy on GETA and colonic stent x 2 was placed.  Patient is now very ill with c/o sweating. She appears confused and toxic. Sister and best friend at bedside. Reportedly,  patient had 2 bm's per rectum today.  Objective: Vital signs in last 24 hours: Temp:  [97.8 F (36.6 C)-98.4 F (36.9 C)] 98.2 F (36.8 C) (07/01 1306) Pulse Rate:  [78-93] 78 (07/01 1306) Resp:  [20-25] 20 (07/01 1306) BP: (109-215)/(59-205) 181/70 (07/01 1306) SpO2:  [96 %-100 %] 99 % (07/01 1306) Weight:  [134.4 kg (296 lb 4.8 oz)] 134.4 kg (296 lb 4.8 oz) (07/01 0417) Blood pressure (!) 181/70, pulse 78, temperature 98.2 F (36.8 C), temperature source Oral, resp. rate 20, height _0  (1.651 m), weight 134.4 kg (296 lb 4.8 oz), SpO2 99 %.    Intake/Output from previous day: 06/30 0701 - 07/01 0700 In: 2576 [P.O.:360; I.V.:1215; IV Piggyback:1001] Out: 2979 [Urine:1450; Drains:95]  Intake/Output this shift: Total I/O In: 486 [P.O.:360; I.V.:126] Out: 30 [Drains:30]   General appearance:  Lethargic, moaning in discomfort Resp:  Decreased breath sounds, with few rhonchi. Cardio:  Slightly tachy, no gallop GI:  Obese, distended. BS+, tender in middle and LLQ Extremities:  2+ edema   Lab Results: Results for orders placed or performed during the hospital encounter of 08/12/2017 (from the past 24 hour(s))  Glucose, capillary     Status: None   Collection Time:  09/01/17  7:58 PM  Result Value Ref Range   Glucose-Capillary 99 70 - 99 mg/dL  Glucose, capillary     Status: Abnormal   Collection Time: 09/01/17 11:59 PM  Result Value Ref Range   Glucose-Capillary 104 (H) 70 - 99 mg/dL  Comprehensive metabolic panel     Status: Abnormal   Collection Time: 09/02/17  3:05 AM  Result Value Ref Range   Sodium 142 135 - 145 mmol/L   Potassium 3.9 3.5 - 5.1 mmol/L   Chloride 113 (H) 98 - 111 mmol/L   CO2 22 22 - 32 mmol/L   Glucose, Bld 118 (H) 70 - 99 mg/dL   BUN 49 (H) 8 - 23 mg/dL   Creatinine, Ser 1.07 (H) 0.44 - 1.00 mg/dL   Calcium 8.1 (L) 8.9 - 10.3 mg/dL   Total Protein 5.5 (L) 6.5 - 8.1 g/dL   Albumin 1.9 (L) 3.5 - 5.0 g/dL   AST 45 (H) 15 - 41 U/L   ALT 20 0 - 44 U/L   Alkaline Phosphatase 135 (H) 38 - 126 U/L   Total Bilirubin 0.6 0.3 - 1.2 mg/dL   GFR calc non Af Amer 49 (L) >60 mL/min   GFR calc Af Amer 57 (L) >60 mL/min   Anion gap 7 5 - 15  Magnesium     Status: None   Collection Time: 09/02/17  3:05 AM  Result Value Ref Range   Magnesium 2.3 1.7 - 2.4 mg/dL  Phosphorus     Status: Abnormal   Collection Time: 09/02/17  3:05 AM  Result Value Ref Range   Phosphorus 5.5 (H) 2.5 -  4.6 mg/dL  CBC     Status: Abnormal   Collection Time: 09/02/17  3:05 AM  Result Value Ref Range   WBC 21.5 (H) 3.6 - 11.0 K/uL   RBC 3.51 (L) 3.80 - 5.20 MIL/uL   Hemoglobin 9.9 (L) 12.0 - 16.0 g/dL   HCT 30.6 (L) 35.0 - 47.0 %   MCV 87.0 80.0 - 100.0 fL   MCH 28.1 26.0 - 34.0 pg   MCHC 32.2 32.0 - 36.0 g/dL   RDW 15.8 (H) 11.5 - 14.5 %   Platelets 420 150 - 440 K/uL  Differential     Status: Abnormal   Collection Time: 09/02/17  3:05 AM  Result Value Ref Range   Neutrophils Relative % 89 %   Neutro Abs 19.2 (H) 1.4 - 6.5 K/uL   Lymphocytes Relative 3 %   Lymphs Abs 0.7 (L) 1.0 - 3.6 K/uL   Monocytes Relative 6 %   Monocytes Absolute 1.2 (H) 0.2 - 0.9 K/uL   Eosinophils Relative 2 %   Eosinophils Absolute 0.3 0 - 0.7 K/uL   Basophils  Relative 0 %   Basophils Absolute 0.0 0 - 0.1 K/uL  Triglycerides     Status: Abnormal   Collection Time: 09/02/17  3:05 AM  Result Value Ref Range   Triglycerides 196 (H) <150 mg/dL  Glucose, capillary     Status: Abnormal   Collection Time: 09/02/17  3:22 AM  Result Value Ref Range   Glucose-Capillary 107 (H) 70 - 99 mg/dL  Glucose, capillary     Status: Abnormal   Collection Time: 09/02/17  7:40 AM  Result Value Ref Range   Glucose-Capillary 116 (H) 70 - 99 mg/dL   Comment 1 Notify RN   Glucose, capillary     Status: Abnormal   Collection Time: 09/02/17 11:44 AM  Result Value Ref Range   Glucose-Capillary 143 (H) 70 - 99 mg/dL   Comment 1 Notify RN   Glucose, capillary     Status: Abnormal   Collection Time: 09/02/17  4:26 PM  Result Value Ref Range   Glucose-Capillary 143 (H) 70 - 99 mg/dL   Comment 1 Notify RN      Recent Labs    09/01/17 0649 09/02/17 0305  WBC 21.0* 21.5*  HGB 9.7* 9.9*  HCT 30.3* 30.6*  PLT 401 420   BMET Recent Labs    08/31/17 0617 09/01/17 0649 09/02/17 0305  NA 140 141 142  K 3.9 3.7 3.9  CL 111 110 113*  CO2 20* 22 22  GLUCOSE 150* 124* 118*  BUN 48* 51* 49*  CREATININE 1.14* 1.02* 1.07*  CALCIUM 8.0* 8.2* 8.1*   LFT Recent Labs    09/02/17 0305  PROT 5.5*  ALBUMIN 1.9*  AST 45*  ALT 20  ALKPHOS 135*  BILITOT 0.6   PT/INR No results for input(s): LABPROT, INR in the last 72 hours. Hepatitis Panel No results for input(s): HEPBSAG, HCVAB, HEPAIGM, HEPBIGM in the last 72 hours. C-Diff No results for input(s): CDIFFTOX in the last 72 hours. No results for input(s): CDIFFPCR in the last 72 hours.   Studies/Results: Dg Abd 1 View  Result Date: 09/01/2017 CLINICAL DATA:  Carcinomatosis with large-bowel obstruction. Bilious vomiting. EXAM: ABDOMEN - 1 VIEW COMPARISON:  CT scan August 29, 2017 FINDINGS: There is a feathery pattern of gas identified over the abdomen. Some of this gas extend outside of the abdominal cavity.  Based on previous CT imaging, I suspect this represents increasing gas  in the subcutaneous tissues. This significantly obscures evaluation of the abdomen. No convincing evidence of free air. A stent and tube again project over the pelvis/lower abdomen. Contrast is seen in the rectum. Hernia mesh is noted. No definitive distended bowel to suggest ongoing obstruction. IMPRESSION: 1. The feathery pattern of air diffusely over the lower chest and abdomen suggests increasing air in the subcutaneous fat. This significantly obscures the remainder of the abdomen but there is no definitive bowel dilatation to suggest ongoing obstruction. Electronically Signed   By: Dorise Bullion III M.D   On: 09/01/2017 18:50    Scheduled Inpatient Medications:   . acetaminophen  1,000 mg Oral Q6H  . aspirin EC  81 mg Oral Daily  . atorvastatin  80 mg Oral Daily  . chlorhexidine gluconate (MEDLINE KIT)  15 mL Mouth Rinse BID  . enoxaparin (LOVENOX) injection  40 mg Subcutaneous Q24H  . feeding supplement  1 Container Oral TID BM  . insulin aspart  0-9 Units Subcutaneous Q4H  . ipratropium-albuterol  3 mL Nebulization Q6H  . levothyroxine  75 mcg Oral QAC breakfast  . methylPREDNISolone (SOLU-MEDROL) injection  60 mg Intravenous Q12H  . metoprolol tartrate  100 mg Oral BID  . sertraline  100 mg Oral Daily  . sodium chloride flush  10-40 mL Intracatheter Q12H  . traMADol  50 mg Oral Q6H    Continuous Inpatient Infusions:   . ampicillin-sulbactam (UNASYN) IV Stopped (09/02/17 1446)  . famotidine (PEPCID) IV Stopped (09/01/17 2158)  . Fat emulsion    . TPN (CLINIMIX) Adult without lytes    . Marland KitchenTPN (CLINIMIX-E) Adult 83 mL/hr at 09/01/17 1808  . vancomycin Stopped (08/31/17 2048)    PRN Inpatient Medications:  albuterol, alum & mag hydroxide-simeth, bisacodyl, fluticasone, hydrALAZINE, HYDROmorphone (DILAUDID) injection, ketorolac, ondansetron **OR** ondansetron (ZOFRAN) IV, sodium chloride flush  Miscellaneous:  N/A  Assessment:  1. Abdominal wall cellulits - On IV antitibiotics Wound vac per DR. Pabon.  2.. Peritoneal carcinomatosis with colonic obstruction - s/p Colonic stent placement on 08/07/2017.  3. Respiratory failure with MAC anesthesia pre-sigmoidoscopy.- Required preprocedural General anesthesia to support airway to achieve colonic stent placement. S/p extubation.  4. Morbid obesity.  5. Spinal stenosis.  6. Vitamin B12 deficiency.    Plan:  1. Continue liquid diet as tolerated, antibiotics 2. Unfortunate situation.  3. Support given to family and I will continue to follow.   Teodoro K. Alice Reichert, M.D. 09/02/2017, 4:53 PM

## 2017-09-02 NOTE — Consult Note (Signed)
[facility]     Assessment and Plan:   Hypoxic respiratory failure, acute on chronic. Wheezing.   Suspect above are secondary to recent episode of aspiration and/or ongoing aspiration pneumonitis.  Patient appears to be doing better than she was yesterday, family is satisfied with current progress.  - Continue duo nebs. - Continue steroids, wean as tolerated. - Continue Unasyn, which would adequately cover for possible aspiration pneumonia/pneumonitis. - Discussed with daughter and husband at bedside, they would prefer to hold off on chest x-ray as they feel that patient's respiratory status is improving.  Date: 09/02/2017  MRN# [id number] Alicia Conley 01/17/1941  Referring Physician:   LOLA Conley is a 77 y.o. old female seen in consultation for chief complaint of: wheezing.     HPI:   The patient is a 77 year old female presented to the hospital on 08/16/2017 with approximately 2 weeks of a left lower quadrant pain, with diarrhea.  CT showed partial distal colonic obstruction, the patient was able to pass flatus, and was discharged.  Her symptoms progressed, she saw gastroenterologist outpatient where she was directly admitted to the hospital on 6/19.  She was maintained n.p.o., repeat CT abdomen pelvis on 6/20 was suggestive of peritoneal carcinomatosis.  She underwent sigmoidoscopy on 6/21 which showed stool in the sigmoid but was otherwise unrevealing. On 6/23 the patient went for a diverting colostomy however the patient's abdomen was frozen due to massive and multiple peritoneal implants, therefore she went underwent laparoscopic biopsies and was closed.  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 On 6/26 patient underwent a repeat sigmoidoscopy which showed a stricture in the mid-sigmoid colon, and had a stent placed.  Postoperatively her course was complicated by acute respiratory failure with presumed aspiration pneumonitis, patient  was transferred to the ICU.  Patient was extubated the next day on 6/27, transferred to the medical floor on 6/29.  The patient has been started on TPN. Yesterday (6/30) the patient had a bout of vomiting, it was recommended that the patient get a abdominal x-ray, however patient's family asked to defer to gastroenterology who approved abdominal x-ray. On evaluation today by hospitalist service it was noted patient had some wheezing, chest x-ray was recommended, however family declined. Images personally reviewed, lower cuts of the lung seen on most recent CT abdomen pelvis, showed no evidence of infiltrate.  Earlier this morning the patient's blood pressure was very elevated at 215/205, now down to 181/70.  Patient has been maintained on nasal cannula at 3 L.  Review of laboratory values is unremarkable other than mild hyperchloremia with chronic kidney disease, hyperphosphatemia, leukocytosis.  Currently the patient is being maintained on Unasyn.  PMHX:   Past Medical History:  Diagnosis Date  . Cancer ([place])   . High cholesterol   . Hypertension   . Thyroid disease    Surgical Hx:  Past Surgical History:  Procedure Laterality Date  . BREAST BIOPSY Left 11/07/12   u/s bx lt -neg  . BREAST CYST ASPIRATION Left    neg  . COLONIC STENT PLACEMENT N/A 08/13/2017   Procedure: COLONIC STENT PLACEMENT;  Surgeon: Toledo, Benay Pike, MD;  Location: [facility] ENDOSCOPY;  Service: Gastroenterology;  Laterality: N/A;  . EP IMPLANTABLE DEVICE N/A 04/03/2016   Procedure: Loop Recorder Insertion;  Surgeon: Isaias Cowman, MD;  Location: [facility];  Service: Cardiovascular;  Laterality: N/A;  . FLEXIBLE SIGMOIDOSCOPY N/A 08/20/2017   Procedure: FLEXIBLE SIGMOIDOSCOPY;  Surgeon: Alice Reichert, Benay Pike, MD;  Location: [facility] ENDOSCOPY;  Service: Gastroenterology;  Laterality: N/A;  . FLEXIBLE SIGMOIDOSCOPY N/A 08/15/2017   Procedure: FLEXIBLE SIGMOIDOSCOPY;  Surgeon: Toledo, Benay Pike, MD;  Location: [facility]  ENDOSCOPY;  Service: Gastroenterology;  Laterality: N/A;  . HERNIA REPAIR    . LAPAROTOMY N/A 08/05/2017   Procedure: EXPLORATORY LAPAROTOMY;  Surgeon: Jules Husbands, MD;  Location: [facility] ORS;  Service: General;  Laterality: N/A;  . OOPHORECTOMY Bilateral    Family Hx:  Family History  Problem Relation Age of Onset  . Breast cancer Paternal Aunt 58  . Breast cancer Cousin 31       mat cousin   Social Hx:   Social History   Tobacco Use  . Smoking status: Never Smoker  . Smokeless tobacco: Never Used  Substance Use Topics  . Alcohol use: Never    Frequency: Never  . Drug use: Never   Medication:    Current Facility-Administered Medications:  .  acetaminophen (TYLENOL) tablet 1,000 mg, 1,000 mg, Oral, Q6H, Vickie Epley, MD, 1,000 mg at 09/02/17 1210 .  albuterol (PROVENTIL) (2.5 MG/3ML) 0.083% nebulizer solution 2.5 mg, 2.5 mg, Nebulization, Q3H PRN, Wilhelmina Mcardle, MD, 2.5 mg at 09/02/17 0827 .  alum & mag hydroxide-simeth (MAALOX/MYLANTA) 200-200-20 MG/5ML suspension 15 mL, 15 mL, Oral, Q4H PRN, Pabon, Diego F, MD, 15 mL at 09/01/17 1052 .  Ampicillin-Sulbactam (UNASYN) 3 g in sodium chloride 0.9 % 100 mL IVPB, 3 g, Intravenous, Q6H, Fritzi Mandes, MD, Last Rate: 200 mL/hr at 09/02/17 1223, 3 g at 09/02/17 1223 .  aspirin EC tablet 81 mg, 81 mg, Oral, Daily, Pabon, [place] F, MD, 81 mg at 09/02/17 1213 .  atorvastatin (LIPITOR) tablet 80 mg, 80 mg, Oral, Daily, Pabon, Diego F, MD, 80 mg at 09/02/17 1213 .  bisacodyl (DULCOLAX) suppository 10 mg, 10 mg, Rectal, Daily PRN, Pabon, Diego F, MD .  chlorhexidine gluconate (MEDLINE KIT) (PERIDEX) 0.12 % solution 15 mL, 15 mL, Mouth Rinse, BID, Blakeney, Dreama Saa, NP, 15 mL at 09/01/17 2045 .  enoxaparin (LOVENOX) injection 40 mg, 40 mg, Subcutaneous, Q24H, Fritzi Mandes, MD, 40 mg at 09/02/17 1213 .  famotidine (PEPCID) IVPB 20 mg premix, 20 mg, Intravenous, Q12H, Vickie Epley, MD, Stopped at 09/01/17 2158 .  Fat emulsion 20 %  infusion 250 mL, 250 mL, Intravenous, Continuous TPN, Coffee, Donna Christen, [facility] .  feeding supplement (BOOST / RESOURCE BREEZE) liquid 1 Container, 1 Container, Oral, TID BM, Vickie Epley, MD, 1 Container at 09/02/17 [phone] .  fluticasone (FLONASE) 50 MCG/ACT nasal spray 1 spray, 1 spray, Each Nare, Daily PRN, Pabon, Diego F, MD .  hydrALAZINE (APRESOLINE) injection 10 mg, 10 mg, Intravenous, Q4H PRN, Pabon, Diego F, MD, 10 mg at 08/27/2017 0534 .  HYDROmorphone (DILAUDID) injection 0.5 mg, 0.5 mg, Intravenous, Q6H PRN, Sudini, Srikar, MD .  insulin aspart (novoLOG) injection 0-9 Units, 0-9 Units, Subcutaneous, Q4H, Salary, Montell D, MD, 1 Units at 09/02/17 1307 .  ipratropium-albuterol (DUONEB) 0.5-2.5 (3) MG/3ML nebulizer solution 3 mL, 3 mL, Nebulization, Q6H, Sudini, Srikar, MD .  ketorolac (TORADOL) 30 MG/ML injection 15 mg, 15 mg, Intravenous, Q6H, Vickie Epley, MD, 15 mg at 09/02/17 0508 .  levothyroxine (SYNTHROID, LEVOTHROID) tablet 75 mcg, 75 mcg, Oral, QAC breakfast, Pabon, [place] F, MD, 75 mcg at 09/02/17 0828 .  methylPREDNISolone sodium succinate (SOLU-MEDROL) 125 mg/2 mL injection 60 mg, 60 mg, Intravenous, Q12H, Sudini, Srikar, MD .  metoprolol tartrate (LOPRESSOR) tablet 100 mg, 100 mg, Oral, BID, Pabon,  Diego F, MD, 100 mg at 09/02/17 0918 .  ondansetron (ZOFRAN) tablet 4 mg, 4 mg, Oral, Q6H PRN **OR** ondansetron (ZOFRAN) injection 4 mg, 4 mg, Intravenous, Q6H PRN, Pabon, Diego F, MD, 4 mg at 08/27/17 2048 .  sertraline (ZOLOFT) tablet 100 mg, 100 mg, Oral, Daily, Pabon, [place] F, MD, 100 mg at 09/02/17 1223 .  sodium chloride flush (NS) 0.9 % injection 10-40 mL, 10-40 mL, Intracatheter, Q12H, Salary, Montell D, MD, 10 mL at 09/02/17 1238 .  sodium chloride flush (NS) 0.9 % injection 10-40 mL, 10-40 mL, Intracatheter, PRN, Salary, Montell D, MD .  TPN (CLINIMIX) Adult without lytes, , Intravenous, Continuous TPN, Coffee, Donna Christen, RPH .  TPN (CLINIMIX-E) Adult, , Intravenous,  Continuous TPN, Last Rate: 83 mL/hr at 09/01/17 1808 **AND** [EXPIRED] Fat emulsion 20 % infusion 250 mL, 250 mL, Intravenous, Continuous TPN, Salary, Avel Peace, MD, Stopped at 09/02/17 0443 .  traMADol (ULTRAM) tablet 50 mg, 50 mg, Oral, Q6H PRN, Hillary Bow, MD, 50 mg at 09/01/17 1646 .  vancomycin (VANCOCIN) 1,500 mg in sodium chloride 0.9 % 500 mL IVPB, 1,500 mg, Intravenous, Q24H, Ramond Dial, RPH, Stopped at 08/31/17 2048   Allergies:  Patient has no known allergies.  Review of Systems: Gen:  Denies  fever, sweats, chills HEENT: Denies blurred vision, double vision. bleeds, sore throat Cvc:  No dizziness, chest pain. Resp:   Denies cough or sputum production, shortness of breath Gi: Denies swallowing difficulty, stomach pain. Gu:  Denies bladder incontinence, burning urine Ext:   No Joint pain, stiffness. Skin: No skin rash,  hives  Endoc:  No polyuria, polydipsia. Psych: No depression, insomnia. Other:  All other systems were reviewed with the patient and were negative other that what is mentioned in the HPI.   Physical Examination:   VS: BP (!) 181/70 (BP Location: Left Arm)   Pulse 78   Temp 98.2 F (36.8 C) (Oral)   Resp 20   Ht _0  (1.651 m)   Wt 296 lb 4.8 oz (134.4 kg)   SpO2 99%   BMI 49.31 kg/m   General Appearance: No distress  Neuro:without focal findings,  speech normal,  HEENT: PERRLA, EOM intact.   Pulmonary: normal breath sounds, No wheezing.  CardiovascularNormal S1,S2.  No m/r/g.   Abdomen: Benign, Soft, non-tender. Renal:  No costovertebral tenderness  GU:  No performed at this time. Endoc: No evident thyromegaly, no signs of acromegaly. Skin:   warm, no rashes, no ecchymosis  Extremities: normal, no cyanosis, clubbing.  Other findings:    LABORATORY PANEL:   CBC Recent Labs  Lab 09/02/17 0305  WBC 21.5*  HGB 9.9*  HCT 30.6*  PLT 420    ------------------------------------------------------------------------------------------------------------------  Chemistries  Recent Labs  Lab 09/02/17 0305  NA 142  K 3.9  CL 113*  CO2 22  GLUCOSE 118*  BUN 49*  CREATININE 1.07*  CALCIUM 8.1*  MG 2.3  AST 45*  ALT 20  ALKPHOS 135*  BILITOT 0.6   ------------------------------------------------------------------------------------------------------------------  Cardiac Enzymes No results for input(s): TROPONINI in the last 168 hours. ------------------------------------------------------------  RADIOLOGY:  Dg Abd 1 View  Result Date: 09/01/2017 CLINICAL DATA:  Carcinomatosis with large-bowel obstruction. Bilious vomiting. EXAM: ABDOMEN - 1 VIEW COMPARISON:  CT scan August 29, 2017 FINDINGS: There is a feathery pattern of gas identified over the abdomen. Some of this gas extend outside of the abdominal cavity. Based on previous CT imaging, I suspect this represents increasing gas in the subcutaneous tissues.  This significantly obscures evaluation of the abdomen. No convincing evidence of free air. A stent and tube again project over the pelvis/lower abdomen. Contrast is seen in the rectum. Hernia mesh is noted. No definitive distended bowel to suggest ongoing obstruction. IMPRESSION: 1. The feathery pattern of air diffusely over the lower chest and abdomen suggests increasing air in the subcutaneous fat. This significantly obscures the remainder of the abdomen but there is no definitive bowel dilatation to suggest ongoing obstruction. Electronically Signed   By: Dorise Bullion III M.D   On: 09/01/2017 18:50       Thank  you for the consultation and for allowing [facility] to assist in the care of your patient. Our recommendations are noted above.  Please contact us if we can be of further service.   Marda Stalker, M.D., F.C.C.P.  Board Certified in Internal Medicine, Pulmonary Medicine,  [place], and Sleep Medicine.  [facility] Office Number: [phone]   09/02/2017

## 2017-09-02 NOTE — Progress Notes (Signed)
Nutrition Follow-up  DOCUMENTATION CODES:   Morbid obesity  INTERVENTION:   Change to Clinimix 5/15 without electrolytes and continue at goal rate of 83 mL/hr- recommend continue TPN until pt able to meet 75% of her estimated needs via oral intake.   Continue 20% lipids at 75m/hr x 12 hrs/day    Provides 2014 kcal, 100 grams of protein, 2292 mL fluid daily.  Continue adult MVI and trace elements as daily TPN additives.   Boost Breeze po TID, each supplement provides 250 kcal and 9 grams of protein  NUTRITION DIAGNOSIS:   Inadequate oral intake related to poor appetite, nausea, vomiting as evidenced by per patient/family report.  Ongoing - addressing with TPN.  GOAL:   Patient will meet greater than or equal to 90% of their needs  Met with TPN.  MONITOR:   Diet advancement, Labs, Weight trends, I & O's, Skin, TPN  ASSESSMENT:   77year old female who was directly admitted to the hospital by GI clinic for abdominal discomfort. PMH significant for hyperlipidemia, hypertension, and hypothyroidism. KUB showed no evidence of free air and no evidence of large or small bowel obstruction.   -Patient s/p laparotomy on 6/23 with unsuccessful attempted colostomy instead with decompressing JP drain to colon. Findings were large bowel obstruction from carcinomatosis, frozen abdomen with multiple massive peritoneal implants (colon was unable to be mobilized), distended colon without ischemia. -Patient s/p flexible sigmoidoscopy with colonic stent placement on 6/26. Patient subsequently developed severe acute respiratory failure from acute aspiration pneumonitis and was transferred to ICU on ventilator. -Patient s/p extubation 6/27.  Pt tolerating TPN well; phosphorus elevated so will change to Clinimix without electrolytes. Pt noted to have vomited green bile on 6/30; KUB reports "no definitive bowel dilatation to suggest ongoing obstruction." Pt on clear liquid diet with Boost Breeze.  Recommend continue TPN until pt is able to meet 75% of her estimated needs via oral intake. Per chart, pt with 16lb wt gain since admit; pt with moderate pitting edema in BLE and +10L on I & O's.   Medications reviewed and include: aspirin, lovenox, insulin [place], synthroid, unasyn, pepcid, vancomycin   Labs reviewed: K 3.9 wnl, P 5.5(H), Mg 2.3 wnl, BUN 49(H), creat 1.07(H), Ca 8.1(L) adj. 9.78 wnl, alb 1.9(L) Triglycerides- 196(H) Wbc- 21.5(H), Hgb 9.9(L), Hct 30.6(L) cbgs- 107, 116 x 24 hrs  Diet Order:   Diet Order           Diet clear liquid Room service appropriate? Yes; Fluid consistency: Thin  Diet effective now         EDUCATION NEEDS:   No education needs have been identified at this time  Skin:  Skin Assessment: Reviewed RN Assessment(incision abdomen )  Last BM:  6/23- type 7  Height:   Ht Readings from Last 1 Encounters:  08/22/2017 5' 5"  (1.651 m)    Weight:   Wt Readings from Last 1 Encounters:  09/02/17 296 lb 4.8 oz (134.4 kg)    Ideal Body Weight:  56.8 kg  BMI:  Body mass index is 49.31 kg/m.  Estimated Nutritional Needs:   Kcal:  2000-2300kcal/day   Protein:  127-140g/day   Fluid:  >1.7L/day   CKoleen DistanceMS, RD, LDN Pager #- [phone]#- [phone] Hours Pager: [phone]

## 2017-09-02 NOTE — Progress Notes (Signed)
Hematology/Oncology Progress Note [facility] Telephone:([phone] [phone]  Patient Care Team: [name], MD as PCP - General (Family Medicine) Clent Jacks, RN as Registered Nurse   Name of the patient: Alicia Conley  [id number]  1941/02/12  Date of visit: 09/02/17   INTERVAL HISTORY-  Patient is status post lap with JP drain, colon stent placement, abdominal cellulitis from wound with wound VAC, developed respiratory failure after procedure, recently transferred out of ICU Appears very confused. Patient's husband and granddaughter were at bedside   Review of systems- Review of Systems  Unable to perform ROS: Mental status change    No Known Allergies  Patient Active Problem List   Diagnosis Date Noted  . Large bowel obstruction ([place])   . 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 Abdominal pain 08/20/2017  . Ileus ([place]) 08/24/2017     Past Medical History:  Diagnosis Date  . Cancer ([place])   . High cholesterol   . Hypertension   . Thyroid disease      Past Surgical History:  Procedure Laterality Date  . BREAST BIOPSY Left 11/07/12   u/s bx lt -neg  . BREAST CYST ASPIRATION Left    neg  . COLONIC STENT PLACEMENT N/A 09/01/2017   Procedure: COLONIC STENT PLACEMENT;  Surgeon: Toledo, Benay Pike, MD;  Location: [facility] ENDOSCOPY;  Service: Gastroenterology;  Laterality: N/A;  . EP IMPLANTABLE DEVICE N/A 04/03/2016   Procedure: Loop Recorder Insertion;  Surgeon: Isaias Cowman, MD;  Location: [place] CV LAB;  Service: Cardiovascular;  Laterality: N/A;  . FLEXIBLE SIGMOIDOSCOPY N/A 08/09/2017   Procedure: FLEXIBLE SIGMOIDOSCOPY;  Surgeon: Toledo, Benay Pike, MD;  Location: [facility] ENDOSCOPY;  Service: Gastroenterology;  Laterality: N/A;  . FLEXIBLE SIGMOIDOSCOPY N/A 09/01/2017   Procedure: FLEXIBLE SIGMOIDOSCOPY;  Surgeon: Toledo, Benay Pike, MD;  Location: [facility] ENDOSCOPY;  Service: Gastroenterology;  Laterality: N/A;  .  HERNIA REPAIR    . LAPAROTOMY N/A 08/18/2017   Procedure: EXPLORATORY LAPAROTOMY;  Surgeon: Jules Husbands, MD;  Location: [facility] ORS;  Service: General;  Laterality: N/A;  . OOPHORECTOMY Bilateral     Social History   Socioeconomic History  . Marital status: Married    Spouse name: Not on file  . Number of children: Not on file  . Years of education: Not on file  . Highest education level: Not on file  Occupational History  . Not on file  Social Needs  . Financial resource strain: Not on file  . Food insecurity:    Worry: Not on file    Inability: Not on file  . Transportation needs:    Medical: Not on file    Non-medical: Not on file  Tobacco Use  . Smoking status: Never Smoker  . Smokeless tobacco: Never Used  Substance and Sexual Activity  . Alcohol use: Never    Frequency: Never  . Drug use: Never  . Sexual activity: Never  Lifestyle  . Physical activity:    Days per week: Not on file    Minutes per session: Not on file  . Stress: Not on file  Relationships  . Social connections:    Talks on phone: Not on file    Gets together: Not on file    Attends religious service: Not on file    Active member of club or organization: Not on file    Attends meetings of clubs or organizations: Not on file    Relationship status: Not on file  . Intimate partner violence:  Fear of current or ex partner: Not on file    Emotionally abused: Not on file    Physically abused: Not on file    Forced sexual activity: Not on file  Other Topics Concern  . Not on file  Social History Narrative  . Not on file     Family History  Problem Relation Age of Onset  . Breast cancer Paternal Aunt 53  . Breast cancer Cousin 78       mat cousin     Current Facility-Administered Medications:  .  acetaminophen (TYLENOL) tablet 1,000 mg, 1,000 mg, Oral, Q6H, Vickie Epley, MD, 1,000 mg at 09/02/17 1210 .  albuterol (PROVENTIL) (2.5 MG/3ML) 0.083% nebulizer solution 2.5 mg, 2.5 mg,  Nebulization, Q3H PRN, Wilhelmina Mcardle, MD, 2.5 mg at 09/02/17 0827 .  alum & mag hydroxide-simeth (MAALOX/MYLANTA) 200-200-20 MG/5ML suspension 15 mL, 15 mL, Oral, Q4H PRN, Pabon, Diego F, MD, 15 mL at 09/01/17 1052 .  Ampicillin-Sulbactam (UNASYN) 3 g in sodium chloride 0.9 % 100 mL IVPB, 3 g, Intravenous, Q6H, Fritzi Mandes, MD, Last Rate: 200 mL/hr at 09/02/17 1658, 3 g at 09/02/17 1658 .  aspirin EC tablet 81 mg, 81 mg, Oral, Daily, Pabon, [place] F, MD, 81 mg at 09/02/17 1213 .  atorvastatin (LIPITOR) tablet 80 mg, 80 mg, Oral, Daily, Pabon, Diego F, MD, 80 mg at 09/02/17 1213 .  bisacodyl (DULCOLAX) suppository 10 mg, 10 mg, Rectal, Daily PRN, Pabon, Diego F, MD .  chlorhexidine gluconate (MEDLINE KIT) (PERIDEX) 0.12 % solution 15 mL, 15 mL, Mouth Rinse, BID, Blakeney, Dana G, NP, 15 mL at 09/02/17 1314 .  enoxaparin (LOVENOX) injection 40 mg, 40 mg, Subcutaneous, Q24H, Fritzi Mandes, MD, 40 mg at 09/02/17 1213 .  famotidine (PEPCID) IVPB 20 mg premix, 20 mg, Intravenous, Q12H, Vickie Epley, MD, Stopped at 09/01/17 2158 .  Fat emulsion 20 % infusion 250 mL, 250 mL, Intravenous, Continuous TPN, Coffee, Donna Christen, [facility] .  feeding supplement (BOOST / RESOURCE BREEZE) liquid 1 Container, 1 Container, Oral, TID BM, Vickie Epley, MD, 1 Container at 09/02/17 1648 .  fluticasone (FLONASE) 50 MCG/ACT nasal spray 1 spray, 1 spray, Each Nare, Daily PRN, Pabon, Diego F, MD .  hydrALAZINE (APRESOLINE) injection 10 mg, 10 mg, Intravenous, Q4H PRN, Pabon, Diego F, MD, 10 mg at 09/01/2017 0534 .  HYDROmorphone (DILAUDID) injection 0.5 mg, 0.5 mg, Intravenous, Q6H PRN, Sudini, Srikar, MD .  insulin aspart (novoLOG) injection 0-9 Units, 0-9 Units, Subcutaneous, Q4H, Salary, Montell D, MD, 1 Units at 09/02/17 1648 .  ipratropium-albuterol (DUONEB) 0.5-2.5 (3) MG/3ML nebulizer solution 3 mL, 3 mL, Nebulization, Q6H, Sudini, Srikar, MD, 3 mL at 09/02/17 1325 .  ketorolac (TORADOL) 30 MG/ML injection 15 mg, 15 mg,  Intravenous, Q8H PRN, Sudini, Srikar, MD, 15 mg at 09/02/17 1648 .  levothyroxine (SYNTHROID, LEVOTHROID) tablet 75 mcg, 75 mcg, Oral, QAC breakfast, Pabon, [place] F, MD, 75 mcg at 09/02/17 0828 .  methylPREDNISolone sodium succinate (SOLU-MEDROL) 125 mg/2 mL injection 60 mg, 60 mg, Intravenous, Q12H, Sudini, Srikar, MD .  metoprolol tartrate (LOPRESSOR) tablet 100 mg, 100 mg, Oral, BID, Pabon, Diego F, MD, 100 mg at 09/02/17 0918 .  ondansetron (ZOFRAN) tablet 4 mg, 4 mg, Oral, Q6H PRN **OR** ondansetron (ZOFRAN) injection 4 mg, 4 mg, Intravenous, Q6H PRN, Pabon, Diego F, MD, 4 mg at 08/27/17 2048 .  sertraline (ZOLOFT) tablet 100 mg, 100 mg, Oral, Daily, Pabon, [place] F, MD, 100 mg at 09/02/17 1223 .  sodium chloride flush (NS) 0.9 % injection 10-40 mL, 10-40 mL, Intracatheter, Q12H, Salary, Montell D, MD, 10 mL at 09/02/17 1238 .  sodium chloride flush (NS) 0.9 % injection 10-40 mL, 10-40 mL, Intracatheter, PRN, Salary, Montell D, MD .  TPN (CLINIMIX) Adult without lytes, , Intravenous, Continuous TPN, Coffee, Donna Christen, RPH .  TPN (CLINIMIX-E) Adult, , Intravenous, Continuous TPN, Last Rate: 83 mL/hr at 09/01/17 1808 **AND** [EXPIRED] Fat emulsion 20 % infusion 250 mL, 250 mL, Intravenous, Continuous TPN, Salary, Avel Peace, MD, Stopped at 09/02/17 0443 .  traMADol (ULTRAM) tablet 50 mg, 50 mg, Oral, Q6H, Sudini, Srikar, MD .  vancomycin (VANCOCIN) 1,500 mg in sodium chloride 0.9 % 500 mL IVPB, 1,500 mg, Intravenous, Q24H, Ramond Dial, RPH, Stopped at 08/31/17 2048   Physical exam:  Vitals:   09/02/17 0523 09/02/17 0636 09/02/17 0850 09/02/17 1306  BP: (!) 215/205 (!) 147/59 (!) 173/104 (!) 181/70  Pulse: 93 86 83 78  Resp: (!) 21  (!) 25 20  Temp: 97.8 F (36.6 C)  98.2 F (36.8 C) 98.2 F (36.8 C)  TempSrc: Oral  Oral Oral  SpO2: 96%  100% 99%  Weight:      Height:       Physical Exam  GENERAL: Confused  sKIN: Abdominal wound cellulitis with wound VAC. LYMPH: No palpable  cervical and axillary lymphadenopathy  LUNGS: Positive for wheezing HEART:  S1 normal and S2 normal  ABDOMEN: Abdomen open wound. Distended.  EXTREMITIES: Trace edema,  NEURO: confused.      CMP Latest Ref Rng & Units 09/02/2017  Glucose 70 - 99 mg/dL 118(H)  BUN 8 - 23 mg/dL 49(H)  Creatinine 0.44 - 1.00 mg/dL 1.07(H)  Sodium 135 - 145 mmol/L 142  Potassium 3.5 - 5.1 mmol/L 3.9  Chloride 98 - 111 mmol/L 113(H)  CO2 22 - 32 mmol/L 22  Calcium 8.9 - 10.3 mg/dL 8.1(L)  Total Protein 6.5 - 8.1 g/dL 5.5(L)  Total Bilirubin 0.3 - 1.2 mg/dL 0.6  Alkaline Phos 38 - 126 U/L 135(H)  AST 15 - 41 U/L 45(H)  ALT 0 - 44 U/L 20   CBC Latest Ref Rng & Units 09/02/2017  WBC 3.6 - 11.0 K/uL 21.5(H)  Hemoglobin 12.0 - 16.0 g/dL 9.9(L)  Hematocrit 35.0 - 47.0 % 30.6(L)  Platelets 150 - 440 K/uL 420   RADIOGRAPHIC STUDIES: I have personally reviewed the radiological images as listed and agreed with the findings in the report. Ct Abdomen Pelvis Wo Contrast  Result Date: 08/29/2017 CLINICAL DATA:  Abdominal xray revealed collection of air below the left hemidiaphragm likely intraluminal, however unable to exclude a component of free peritoneal air radiologist recommended CT Abd Pelvis. Patient has abdominal pain. Medical record history reports a laparotomy on 08/06/2017. EXAM: CT ABDOMEN AND PELVIS WITHOUT CONTRAST TECHNIQUE: Multidetector CT imaging of the abdomen and pelvis was performed following the standard protocol without IV contrast. COMPARISON:  Current abdomen radiographs. Prior CT dated 08/22/2017. FINDINGS: Lower chest: Small pleural effusions. There is dependent lung base opacity, left greater than right, consistent with atelectasis. Heart is normal in size. Hepatobiliary: There are 4 low-density liver masses consistent with cysts, largest from the posterior segment of the right lobe measuring 5 cm. No other liver abnormalities. The gallbladder is distended. There is dependent density in the  gallbladder consistent with sludge, stones or a combination. No wall thickening. No bile duct dilation. Pancreas: Unremarkable. No pancreatic ductal dilatation or surrounding inflammatory changes. Spleen: Normal in size without  focal abnormality. Adrenals/Urinary Tract: No adrenal masses. Mild to moderate right hydronephrosis and hydroureter. Right ureter is dilated into the pelvis. It is decompressed beginning just above the posterior bladder. No left hydronephrosis. Left ureter is normal course and in caliber. No intrarenal stones. Hyperattenuating mass, average Hounsfield units of 39, arises from the upper pole the right kidney, likely a cyst complicated by hemorrhage or proteinaceous contents. It measures 15 mm. There is a smaller more subtle similar-appearing mass from the medial upper pole the left kidney. No other renal masses. Bladder is decompressed by Foley catheter. Stomach/Bowel: The distal sigmoid colon is irregular with adjacent nodular opacities, consistent with colon carcinoma. Above this irregular area of sigmoid colon lies a stent which extends from the mid sigmoid colon to the lower descending colon. There is a percutaneously placed 2 at enters the left mid abdomen. The tip of this 2 has a configuration consistent with a gastrostomy tube. However, this lies in the:. There is significant subcutaneous air adjacent to the tube in the left mid abdomen lateral to the umbilicus. This portion of the colon is mostly decompressed. Right colon is normal in caliber. An no inflammatory changes. Stomach is mild-to-moderately distended. There is dependent fluid with some dependent contrast. No wall thickening or adjacent inflammation. Small bowel is normal in caliber. There is free intraperitoneal air that is collecting along the anterior mid to upper abdomen, accounting for the abnormality noted on the current radiographs. A trace amount of dense material lies adjacent to the spleen where it is in close  proximity to the gastric fundus. Other small foci of dense material lie along the surface of the right liver lobe. These findings were present on the CT from 08/22/2017 and suggest peritoneal carcinomatosis. No ascites. Vascular/Lymphatic: No pathologically enlarged lymph nodes. Aortic atherosclerosis. Reproductive: Uterus is normal size and configuration. No ovarian/adnexal masses. Other: There is postsurgical scarring with apparent hernia mesh along the anterior abdominal midline stable from the prior CT. A surgical drainage catheter enters the left lower quadrant. Musculoskeletal: No acute fracture or acute finding. 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 No osteoblastic or osteolytic lesions. IMPRESSION: 1. There is free intraperitoneal air. This is greater than expected 4 days post laparotomy, but still could be postsurgical in origin. 2. There is no definite source for this air. However, there is a tube that has the appearance of a gastrostomy tube. This enters the left transverse colon. This is likely a colonic tube for decompression. There is air surrounding the tube in the subcutaneous soft tissues at its insertion site. This is a possible source of an air leak. 3. 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 4. There is mild to moderate right hydronephrosis and right hydroureter. The partial obstruction is at the level of the neoplastic changes around the sigmoid colon. This is also stable from prior CT. Electronically Signed   By: Lajean Manes M.D.   On: 08/29/2017 20:59   Dg Abd 1 View  Result Date: 09/01/2017 CLINICAL DATA:  Carcinomatosis with large-bowel obstruction. Bilious vomiting. EXAM: ABDOMEN - 1 VIEW COMPARISON:  CT scan August 29, 2017 FINDINGS: There is a feathery pattern of gas identified over the abdomen. Some of this gas extend outside of the  abdominal cavity. Based on previous CT imaging, I suspect this represents increasing gas in the subcutaneous tissues. This significantly obscures evaluation of the abdomen.  No convincing evidence of free air. A stent and tube again project over the pelvis/lower abdomen. Contrast is seen in the rectum. Hernia mesh is noted. No definitive distended bowel to suggest ongoing obstruction. IMPRESSION: 1. The feathery pattern of air diffusely over the lower chest and abdomen suggests increasing air in the subcutaneous fat. This significantly obscures the remainder of the abdomen but there is no definitive bowel dilatation to suggest ongoing obstruction. Electronically Signed   By: Dorise Bullion III M.D   On: 09/01/2017 18:50   Dg Abd 1 View  Result Date: 08/22/2017 CLINICAL DATA:  Initial evaluation for enteric tube placement. EXAM: ABDOMEN - 1 VIEW COMPARISON:  None. FINDINGS: Enteric tube in place with tip in side hole seen overlying the stomach, well beyond the GE junction. Tip projects distally. Visualized bowel gas pattern within normal limits. IMPRESSION: Enteric tube in place with tip and side hole overlying the stomach, well beyond the GE junction. Electronically Signed   By: Jeannine Boga M.D.   On: 08/22/2017 20:20   Ct Abdomen Pelvis W Contrast  Result Date: 08/22/2017 CLINICAL DATA:  Follow-up CT scan concerning for bowel obstruction. LEFT lower quadrant pain abdominal pain for 2 weeks. Last bowel movement 1 week ago. EXAM: CT ABDOMEN AND PELVIS WITH CONTRAST TECHNIQUE: Multidetector CT imaging of the abdomen and pelvis was performed using the standard protocol following bolus administration of intravenous contrast. CONTRAST:  106m ISOVUE-300 IOPAMIDOL (ISOVUE-300) INJECTION 61% COMPARISON:  CT 11/28/2013 FINDINGS: Lower chest: Lung bases are clear. Hepatobiliary: Small nodular densities along the surface of the RIGHT hepatic lobe (image 28/2 image 33/2). Nodules measure from 3 to 5 mm are  partially calcified. Sludge or stones in the gallbladder. Small amount pericholecystic fluid adjacent to fundus of the bladder. No biliary duct dilatation. The low several low-density cysts in the liver. Pancreas: Pancreas is normal. No ductal dilatation. No pancreatic inflammation. Spleen: Normal spleen Adrenals/urinary tract: Adrenal glands are normal. Several intermediate density or enhancing lesions the renal cortex. For example 14 mm lesion along the lateral margin of theRIGHT lower pole (image 44/2). Cortical lesion measuring 18 mm on the anterior upper pole on the LEFT (image 24/2). There is mild hydroureter on the RIGHT hydroureter extends to the level of the pelvis. There is peritoneal nodularity in the pelvis mesentery measuring 18 mm appears to be fanning out in the colonic mesentery (image 75/2.) This may obstruct the ureter. Bladder is normal Stomach/Bowel: Stomach, duodenum and small bowel are normal. The ascending, transverse and descending colon are fluid-filled and without haustral markings. This fluid-filled colon extends the level sigmoid colon with there is caliber change. This is in the region of the mesenteric nodularity described renal section. The sigmoid colon is narrowed with potential calcified peritoneal densities (image 73/2). Rectum normal Vascular/Lymphatic: Abdominal aorta is normal caliber. No periportal or retroperitoneal adenopathy. No pelvic adenopathy. Reproductive: Uterus normal.  Ovaries not identified Other: There is a ventral hernia repair. There is enhancing nodularity along the ventral peritoneal surface with a frondlike appearance (image 40/2 for example). Small frondlike cluster measures 21 mm (image 40/2). This is similar to the mesenteric nodularity in the pelvis as well as the nodularity along the RIGHT hepatic lobe. Small calcified nodules also extends along the LEFT pericolic gutter. Musculoskeletal: No aggressive osseous lesion. IMPRESSION: 1. The ascending,  transverse and descending colon is ahaustral, fluid-filled, and mildly distended to the level of sigmoid colon. A caliber change at sigmoid colon with serosal nodularity concerning for obstruction from peritoneal  carcinomatosis. 2. Nodular lesions which are partially calcified along the hepatic margin, pelvic sigmoid mesocolon pelvis as well as the ventral peritoneal space. 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 Partially calcified nodularity suggest potential ovarian source. The ventral midline peritoneal nodularity may most assessable for biopsy. 3. Mild entrapment of the distal RIGHT ureter associated with the mesenteric nodularity. 4. Indeterminate renal lesions. Recommend follow-up MRI renal protocol at some point. These results will be called to the ordering clinician or representative by the Radiologist Assistant, and communication documented in the PACS or zVision Dashboard. Electronically Signed   By: Suzy Bouchard M.D.   On: 08/22/2017 15:09   Dg Abd 2 Views  Result Date: 08/16/2017 CLINICAL DATA:  Bowel obstruction EXAM: ABDOMEN - 2 VIEW COMPARISON:  None. FINDINGS: Scattered gas containing small and large bowel loops without obstructive bowel gas pattern. Surgical tacks project over the mid abdomen presumably from ventral hernia repair. No free air, organomegaly nor suspicious radiopaque calculi. Lower lumbar facet arthropathy. IMPRESSION: Nonobstructed, nondistended bowel gas pattern. Electronically Signed   By: Ashley Royalty M.D.   On: 08/29/2017 19:55   Dg Abd Acute W/chest  Result Date: 08/26/2017 CLINICAL DATA:  Follow-up distal colonic obstruction. EXAM: DG ABDOMEN ACUTE W/ 1V CHEST COMPARISON:  August 25, 2017 FINDINGS: There is a small left effusion. The chest is otherwise stable. Lucency over the upper abdomen on the upright view may represent free postoperative air or air within the skin folder incision. Free air would be expected after surgery yesterday. The colonic loops  are significantly improved with placement of a colonic tube. No other change. IMPRESSION: 1. A colonic tube is been placed with decompression of the colon. 2. Probable postoperative free air, as expected. 3. No other change. Electronically Signed   By: Dorise Bullion III M.D   On: 08/26/2017 08:33   Dg Abd Acute W/chest  Result Date: 08/30/2017 CLINICAL DATA:  Preoperative examination. EXAM: DG ABDOMEN ACUTE W/ 1V CHEST COMPARISON:  08/24/2017; 08/05/2017; CT abdomen pelvis-08/22/2017 FINDINGS: Grossly unchanged cardiac silhouette and mediastinal contours with atherosclerotic plaque within the thoracic aorta. Loop recorder overlies the mid heart. Suspected slight reduction in persistent small left-sided effusion. Improved aeration of lung bases with persistent bibasilar opacities, left greater than right. No new focal airspace opacities. Pulmonary venous congestion without frank evidence of edema. No pneumothorax. No acute osseus abnormalities. Read demonstrated patulous distension of multiple loops of colon with index loop of descending colon within the left mid hemiabdomen measuring approximately 5.6 cm in diameter, similar to the 08/24/2017 examination. No pneumoperitoneum, pneumatosis or portal venous gas No definitive abnormal intra-abdominal calcifications. IMPRESSION: 1. Slight reduction in persistent small left-sided effusion and improved aeration of bilateral lung bases with persistent bibasilar opacities, atelectasis versus infiltrate. 2. Similar findings worrisome for distal colonic obstruction as demonstrated on recent abdominal CT. Electronically Signed   By: Sandi Mariscal M.D.   On: 08/19/2017 08:42   Dg Abd Acute W/chest  Result Date: 08/24/2017 CLINICAL DATA:  Lower abdominal pain. EXAM: DG ABDOMEN ACUTE W/ 1V CHEST COMPARISON:  Chest and abdominal x-rays from yesterday. FINDINGS: Stable cardiomediastinal silhouette. Loop recorder again noted. Mild pulmonary vascular congestion, slightly more  conspicuous on today's study. Unchanged small left pleural effusion and left basilar atelectasis. No pneumothorax. Stable mild dilatation of the colon. No pneumoperitoneum. No acute osseous abnormality. IMPRESSION: 1. Stable mild colonic dilatation. 2. Unchanged small left pleural effusion and left basilar atelectasis. Electronically Signed   By: Titus Dubin M.D.   On: 08/24/2017  08:44   Dg Abd Acute W/chest  Result Date: 08/24/2017 CLINICAL DATA:  Left lower quadrant pain EXAM: DG ABDOMEN ACUTE W/ 1V CHEST COMPARISON:  08/22/2017 FINDINGS: Cardiac shadow is within normal limits. Loop recorder is noted. Left basilar atelectatic changes and small effusion are noted increased from the previous day. Right lung remains clear. No free air is noted. Mild dilatation of the colon is noted similar to that seen on the prior CT examination. Contrast is noted within the bladder. No bony abnormality is seen. IMPRESSION: Stable dilatation of the colon when compared with the prior exam. Increasing left basilar atelectasis and effusion. Electronically Signed   By: Inez Catalina M.D.   On: 08/08/2017 07:21   Dg Abd Portable 2v  Result Date: 08/29/2017 CLINICAL DATA:  Peritoneal carcinomatosis.  Colonic obstruction. EXAM: PORTABLE ABDOMEN - 2 VIEW COMPARISON:  Acute abdominal series 08/26/2017 and KUB 08/05/2017 FINDINGS: Examination demonstrates collection of air under the left hemidiaphragm on the upright film some which is intraluminal although it would be difficult to exclude free peritoneal air. Bowel gas pattern is otherwise nonobstructive. There are 2 catheters over the left lower quadrant/pelvis. Skin staples over the soft tissues of the lateral left pelvis/lower abdomen. Remainder of the exam is unchanged. IMPRESSION: Nonobstructive bowel gas pattern. Collection of air below the left hemidiaphragm on the upright film likely intraluminal although cannot exclude a component of free peritoneal air. Recommend clinical  correlation and consider left-side-down decubitus films to exclude free peritoneal air. Two catheters over the left lower quadrant/left pelvis. These results were called by telephone at the time of interpretation on 08/29/2017 at 5:28 pm to patient's nurse Council Mechanic, who verbally acknowledged these results and will notify patient's physician. Electronically Signed   By: Marin Olp M.D.   On: 08/29/2017 17:28   Dg C-arm 1-60 Min-no Report  Result Date: 08/08/2017 Fluoroscopy was utilized by the requesting physician.  No radiographic interpretation.   [place] Ekg Site Rite  Result Date: 08/27/2017 If Site Rite image not attached, placement could not be confirmed due to current cardiac rhythm.   Assessment and plan-  Patient is a 77 y.o. female who is currently admitted due to carcinomatosis, frozen abdomen, bowel obstruction s/p Laparotomy, abdominal wound infection, respiratory failure after procedure, s/p extubation.   # Abdominal carcinomatosis, pathology result finalized, low grade serous carcinoma, supports GYN origin.  Pathology was discussed with patient's husband and granddaughter.  Poor prognosis given her current complicated condition. Discussed with husband and granddaughter, and also per their request, I also has a phone conversation with patient's daughter. They are aware that patient is critically ill and not candidate of any chemotherapy treatment.  Genetic testing may provide valuable information for her family members, granddaughter is very concerned.  If patient continues to decline, may have to obtain blood sample inpatient. Will discuss with genetic counselor Recommend palliative consult to discuss goal of care.   # wound cellulitis, continue broad spectrum antibiotics. Wound vac.  # continue supportive care.   Thank you for allowing me to participate in the care of this patient.  Total face to face encounter time for this patient visit was 35 min. >50% of the time was   spent in counseling and coordination of care.   Earlie Server, MD, PhD Hematology Oncology [facility] at [facility] Pager- [phone] 09/02/2017

## 2017-09-02 DEATH — deceased

## 2017-09-03 ENCOUNTER — Telehealth: Payer: Self-pay | Admitting: Genetic Counselor

## 2017-09-03 ENCOUNTER — Other Ambulatory Visit: Payer: Self-pay | Admitting: Genetic Counselor

## 2017-09-03 ENCOUNTER — Encounter: Payer: Self-pay | Admitting: Genetic Counselor

## 2017-09-03 DIAGNOSIS — R410 Disorientation, unspecified: Secondary | ICD-10-CM

## 2017-09-03 DIAGNOSIS — C762 Malignant neoplasm of abdomen: Secondary | ICD-10-CM

## 2017-09-03 DIAGNOSIS — F419 Anxiety disorder, unspecified: Secondary | ICD-10-CM

## 2017-09-03 LAB — GLUCOSE, CAPILLARY
GLUCOSE-CAPILLARY: 141 mg/dL — AB (ref 70–99)
Glucose-Capillary: 188 mg/dL — ABNORMAL HIGH (ref 70–99)
Glucose-Capillary: 141 mg/dL — ABNORMAL HIGH (ref 70–99)
Glucose-Capillary: 155 mg/dL — ABNORMAL HIGH (ref 70–99)
Glucose-Capillary: 133 mg/dL — ABNORMAL HIGH (ref 70–99)

## 2017-09-03 LAB — COMPREHENSIVE METABOLIC PANEL
ALBUMIN: 1.8 g/dL — AB (ref 3.5–5.0)
ALT: 27 U/L (ref 0–44)
AST: 61 U/L — AB (ref 15–41)
Alkaline Phosphatase: 157 U/L — ABNORMAL HIGH (ref 38–126)
Anion gap: 10 (ref 5–15)
BUN: 53 mg/dL — AB (ref 8–23)
CO2: 20 mmol/L — ABNORMAL LOW (ref 22–32)
Calcium: 7.9 mg/dL — ABNORMAL LOW (ref 8.9–10.3)
Chloride: 112 mmol/L — ABNORMAL HIGH (ref 98–111)
Creatinine, Ser: 1.19 mg/dL — ABNORMAL HIGH (ref 0.44–1.00)
GFR calc Af Amer: 50 mL/min — ABNORMAL LOW (ref >60)
GFR calc non Af Amer: 43 mL/min — ABNORMAL LOW (ref >60)
Glucose, Bld: 201 mg/dL — ABNORMAL HIGH (ref 70–99)
POTASSIUM: 3.7 mmol/L (ref 3.5–5.1)
SODIUM: 142 mmol/L (ref 135–145)
TOTAL PROTEIN: 5.8 g/dL — AB (ref 6.5–8.1)
Total Bilirubin: 0.5 mg/dL (ref 0.3–1.2)

## 2017-09-03 LAB — CBC WITH DIFFERENTIAL/PLATELET
BASOS ABS: 0 10*3/uL (ref 0–0.1)
Basophils Relative: 0 %
EOS ABS: 0 10*3/uL (ref 0–0.7)
EOS PCT: 0 %
HCT: 28.2 % — ABNORMAL LOW (ref 35.0–47.0)
Hemoglobin: 9.2 g/dL — ABNORMAL LOW (ref 12.0–16.0)
Lymphocytes Relative: 4 %
Lymphs Abs: 0.8 10*3/uL — ABNORMAL LOW (ref 1.0–3.6)
MCH: 29.7 pg (ref 26.0–34.0)
MCHC: 32.8 g/dL (ref 32.0–36.0)
MCV: 90.4 fL (ref 80.0–100.0)
Monocytes Absolute: 1.1 10*3/uL — ABNORMAL HIGH (ref 0.2–0.9)
Monocytes Relative: 5 %
Neutro Abs: 20.2 10*3/uL — ABNORMAL HIGH (ref 1.4–6.5)
Neutrophils Relative %: 91 %
PLATELETS: 381 10*3/uL (ref 150–440)
RBC: 3.12 MIL/uL — ABNORMAL LOW (ref 3.80–5.20)
RDW: 16.2 % — ABNORMAL HIGH (ref 11.5–14.5)
WBC: 22.1 10*3/uL — AB (ref 3.6–11.0)

## 2017-09-03 LAB — TSH: TSH: 0.889 u[IU]/mL (ref 0.350–4.500)

## 2017-09-03 LAB — PHOSPHORUS: Phosphorus: 6.4 mg/dL — ABNORMAL HIGH (ref 2.5–4.6)

## 2017-09-03 LAB — VANCOMYCIN, TROUGH: VANCOMYCIN TR: 22 ug/mL — AB (ref 15–20)

## 2017-09-03 LAB — MAGNESIUM: Magnesium: 2.1 mg/dL (ref 1.7–2.4)

## 2017-09-03 MED ORDER — FLUCONAZOLE IN SODIUM CHLORIDE 400-0.9 MG/200ML-% IV SOLN
400.0000 mg | Freq: Once | INTRAVENOUS | Status: DC
  Filled 2017-09-03: qty 200

## 2017-09-03 MED ORDER — PIPERACILLIN-TAZOBACTAM 3.375 G IVPB
3.3750 g | Freq: Three times a day (TID) | INTRAVENOUS | Status: DC
  Administered 2017-09-03 – 2017-09-07 (×11): 3.375 g via INTRAVENOUS
  Filled 2017-09-03 (×11): qty 50

## 2017-09-03 MED ORDER — FLUCONAZOLE IN SODIUM CHLORIDE 400-0.9 MG/200ML-% IV SOLN
400.0000 mg | Freq: Once | INTRAVENOUS | Status: AC
  Administered 2017-09-03: 400 mg via INTRAVENOUS
  Filled 2017-09-03: qty 200

## 2017-09-03 MED ORDER — HALOPERIDOL LACTATE 5 MG/ML IJ SOLN
0.5000 mg | Freq: Three times a day (TID) | INTRAMUSCULAR | Status: DC | PRN
  Administered 2017-09-03 – 2017-09-05 (×3): 0.5 mg via INTRAVENOUS
  Filled 2017-09-03 (×4): qty 0.1

## 2017-09-03 MED ORDER — FLUCONAZOLE IN SODIUM CHLORIDE 400-0.9 MG/200ML-% IV SOLN
400.0000 mg | Freq: Every day | INTRAVENOUS | Status: DC
  Filled 2017-09-03: qty 200

## 2017-09-03 MED ORDER — HALOPERIDOL 0.5 MG PO TABS
0.5000 mg | ORAL_TABLET | Freq: Three times a day (TID) | ORAL | Status: DC | PRN
  Filled 2017-09-03: qty 1

## 2017-09-03 MED ORDER — TRACE MINERALS CR-CU-MN-SE-ZN 10-1000-500-60 MCG/ML IV SOLN
INTRAVENOUS | Status: AC
  Administered 2017-09-03: 18:00:00 via INTRAVENOUS
  Filled 2017-09-03: qty 2000

## 2017-09-03 MED ORDER — SODIUM CHLORIDE 0.9 % IV SOLN
1250.0000 mg | INTRAVENOUS | Status: DC
  Administered 2017-09-03: 1250 mg via INTRAVENOUS
  Filled 2017-09-03 (×2): qty 1250

## 2017-09-03 MED ORDER — FLUCONAZOLE IN SODIUM CHLORIDE 400-0.9 MG/200ML-% IV SOLN
800.0000 mg | Freq: Once | INTRAVENOUS | Status: DC
  Filled 2017-09-03: qty 400

## 2017-09-03 NOTE — Progress Notes (Signed)
Spoke with Dr Rogue Bussing concerning lab draw for genetic testing. There is a special kit  That needs to be obtained from [facility] in the am. Will inform daytime nurse in am.

## 2017-09-03 NOTE — Consult Note (Signed)
Pharmacy Antibiotic Note  Alicia Conley is a 77 year old female s/p  Colonic decompressive tube now with possible abdominal wall cellulitis. Pharmacy has been consulted for vancomycin dosing. Unasyn changed to Zosyn on 7/2.  Plan: Vancomycin 1g once. Will give next dose in 6 hours for stacked dosing Vancomycin 1500mg  q 24 hr Goal trough 10-15 Trough prior to the 5th dose 7/2 @ 1730  7/2: Vanc trough resulted at 22 mcg/ml before 4th scheduled dose; trough level drawn 1-2 hours early. Of note dose on 6/30 is charted as paused. Unclear if pt actually received full dose or not on 6/30; based on trough result, likely given? Will decrease dose to vancomycin 1250 mg IV q24h and recheck level prior to 3rd new dose (as this level was elevated).   Zosyn 3.375 g IV q8h EI   Height: 5\' 5"  (165.1 cm) Weight: 296 lb 4.8 oz (134.4 kg) IBW/kg (Calculated) : 57  Temp (24hrs), Avg:98 F (36.7 C), Min:97.7 F (36.5 C), Max:98.2 F (36.8 C)  Recent Labs  Lab 08/29/17 0516 08/30/17 0605 08/31/17 0617 09/01/17 0649 09/02/17 0305 09/03/17 0533  WBC 17.4* 19.2*  --  21.0* 21.5* 22.1*  CREATININE 1.15* 1.20* 1.14* 1.02* 1.07* 1.19*    Estimated Creatinine Clearance: 55.9 mL/min (A) (by C-G formula based on SCr of 1.19 mg/dL (H)).    No Known Allergies  Antimicrobials this admission: unasyn 6/28 >> 7/2 vancomycin 6/29 >>  Zosyn  7/2 >>  Dose adjustments this admission:   Microbiology results:  Thank you for allowing pharmacy to be a part of this patient's care.  Rayna Sexton, PharmD, BCPS Clinical Pharmacist 09/03/2017 5:01 PM

## 2017-09-03 NOTE — Progress Notes (Addendum)
Daily Progress Note   Patient Name: Alicia Conley       Date: 09/03/2017 DOB: 1940-06-05  Age: 77 y.o. MRN#: [id number] Attending Physician: Hillary Bow, MD Primary Care Physician: [name], MD Admit Date: 08/22/2017  Reason for Consultation/Follow-up: Establishing goals of care  Subjective: Patient is lying in bed on her side with occasional moan. She is a little more alert today. Continues to appear somewhat uncomfortable. States she is having some back and abdominal pain. Bedside RN preparing to administer pain medication. Family at bedside (granddaughter, daughter, husband, brother, sister, and lots of grandchildren).  Family states she has not wanted much to eat/drink since last night. She took in a few cups of water this morning and a few bites of jello. They are concerned that their is stool content draining in the wound vac and have some concerns around nursing care. Concerns were discussed with family and nurse. Family is very agitated and guarded based on her overall condition I think.   Dr. Dahlia Byes and I, along with Eustaquio Maize, RN met with entire family. Dr. Dahlia Byes spoke in great detail regarding her course of care since admission and all procedures and the expected goals/outcomes. Family had many questions which he answered. Family was very appreciative of his time and medical input. I then met alone with family and we discussed their conversations with Dr. Tasia Catchings on yesterday evening.  Family was tearful and continued to state they are hopeful that she would regain some strength and be a candidate for chemotherapy. Direct conversation regarding the best case worst case scenario took place and family seemed more reasonable regarding her prognosis.  They had some discussion amongst themselves  and did agree that at this point and knowing the criticalness of her health that she may not survive if she was to code and/or be placed on life support. Family made a decision to make her a DNR/DNI. Husband stated he feels this would be what she wants, and has made comments she would not want to live the way she is and was tired. We discussed aggressive measures versus comfort measures. Family is not prepared to shift to comfort and would like for medical team to continue treating the treatable. However, they are more understandable that she may not get better or be able to have chemotherapy and will  be faced with the decision of hospice/comfort care. Family verbalized wanting her to be a little more comfortable as it seems that her current regimen is not allowing her to be as comfortable as they would like. Daughter verbalizes she feels she is somewhat anxious as well and that her pain is only focused on when certain medical providers or family are at the bedside, other wise she is more anxious than anything. Family aware that we can try something for agitation/anxiety with hopes this would help relax her some. They verbalized agreement. Family plans to continue discussions amongst themselves regarding future plan of care. They are interested in genetic counseling and testing as mentioned by Dr. Tasia Catchings. I advised to discuss this more in detail with her and that it would also require some extensive counseling for those involved. Family verbalized understanding.   Family is aware DNR/DNI orders will be placed in the computer and RN will place bracelet to alert staff of this status.     Chart Reviewed and report received from Bedside RN.   Length of Stay: 13  Current Medications: Scheduled Meds:  . acetaminophen  1,000 mg Oral Q6H  . aspirin EC  81 mg Oral Daily  . atorvastatin  80 mg Oral Daily  . chlorhexidine gluconate (MEDLINE KIT)  15 mL Mouth Rinse BID  . enoxaparin (LOVENOX) injection  40 mg  Subcutaneous Q24H  . feeding supplement  1 Container Oral TID BM  . insulin aspart  0-9 Units Subcutaneous Q4H  . ipratropium-albuterol  3 mL Nebulization Q6H  . levothyroxine  75 mcg Oral QAC breakfast  . methylPREDNISolone (SOLU-MEDROL) injection  60 mg Intravenous Q12H  . metoprolol tartrate  100 mg Oral BID  . sertraline  100 mg Oral Daily  . sodium chloride flush  10-40 mL Intracatheter Q12H  . traMADol  50 mg Oral Q6H    Continuous Infusions: . ampicillin-sulbactam (UNASYN) IV Stopped (09/03/17 1100)  . famotidine (PEPCID) IV Stopped (09/02/17 2245)  . [START ON 09/04/2017] fluconazole (DIFLUCAN) IV    . fluconazole (DIFLUCAN) IV 400 mg (09/03/17 1400)  . fluconazole (DIFLUCAN) IV    . TPN (CLINIMIX) Adult without lytes 83 mL/hr at 09/02/17 1828  . TPN (CLINIMIX) Adult without lytes    . vancomycin Stopped (09/02/17 2036)    PRN Meds: albuterol, alum & mag hydroxide-simeth, bisacodyl, fluticasone, hydrALAZINE, HYDROmorphone (DILAUDID) injection, ketorolac, ondansetron **OR** ondansetron (ZOFRAN) IV, sodium chloride flush  Physical Exam    Constitutional: Awake and alert to family only. Delirium. Vital signs are normal. She appears well-developed. She has a sickly appearance.  Cardiovascular: Normal rate, regular rhythm, normal heart sounds, intact distal pulses and normal pulses.  Pulmonary/Chest: Effort normal and breath sounds wheeze + Abdominal: Normal appearance. Bowel sounds are decreased. Abdominal dressing s/p palliative colonic stent with wound vac.  Neurological: She is alert to family only.  Skin: Right upper arm PICC line in place.  Psychiatric: Her speech is normal and behavior is normal. Judgment and thought content impaired.  Nursing note and vitals reviewed.  Vital Signs: BP (!) 172/64 (BP Location: Left Arm)   Pulse 89   Temp 98.2 F (36.8 C) (Oral)   Resp 19   Ht 5' 5"  (1.651 m)   Wt 134.4 kg (296 lb 4.8 oz)   SpO2 95%   BMI 49.31 kg/m  SpO2: SpO2:  95 % O2 Device: O2 Device: Nasal Cannula O2 Flow Rate: O2 Flow Rate (L/min): 2 L/min  Intake/output summary:   Intake/Output Summary (  Last 24 hours) at 09/03/2017 1503 Last data filed at 09/03/2017 1300 Gross per 24 hour  Intake 1106 ml  Output 2730 ml  Net -1624 ml   LBM: Last BM Date: 09/01/17 Baseline Weight: Weight: 126.9 kg (279 lb 12.2 oz) Most recent weight: Weight: 134.4 kg (296 lb 4.8 oz)       Palliative Assessment/Data: PPS 20%    Patient Active Problem List   Diagnosis Date Noted  . Goals of care, counseling/discussion   . Colonic obstruction ([place])   . Abdominal carcinomatosis ([place])   . Abdominal pain 08/07/2017  . Ileus ([place]) 08/13/2017    Palliative Care Assessment & Plan   Patient Profile: 77 y.o. female admitted on 08/16/2017 from home with abdominal pain for the last 2.5 weeks. She has a past medical history of hyperlipidemia, hypertension, and hypothyroidism. She had been worked up with [place] and CT abdomen revealed a partial distal colonic obstruction with a large amount of stool. She was seen at our ER on Saturday and was discharged after evaluation. She continued to have discomfort and unable to eat or drink fluids, which led her to see Dr. Alice Reichert (Gastroenterologist). He directly admitted her. She reported her last bowel movement was more than a week prior to admission. Since admission patient has received fluid and electrolyte replacement. She is s/p flexible sigmoidoscopy on 08/10/2017 noted for internal hemorrhoids and diverticulosis. An attempt was made for a diverting colostomy on 08/30/2017 which was aborted given severe carcinomatosis; biopsy was taken. She has been seen by Dr. Mike Gip, Oncologist to evaluate plan of care. Palliative Medicine team consulted for goals of care discussion.   Recommendations/Plan:  DNR/DNI-at family's request  Continue to treat the treatable. Patient and family is seems more understanding after detailed conversation with  Dr. Tasia Catchings, myself and Dr. Dahlia Byes. They are going to continue discussions amongst themselves regarding future plans.    Patient/family would like to allow time for watchful waiting to see if she will signs of recovery. They are more prepared after today's goals of care discussion, to shift to comfort measures and involve hospice care if she continues to decline and does not show signs of improvement over the next few days.   Family concerned that patient is more anxious and agitated at times. Discussed adding in anxiety regimen to see if this will make a difference and increase her level of comfort. Family is aware the goal is to promote relaxation and comfort, however, she could experience no change or become more sedated. RN will closely evaluate her response   Haldol 0.5 mg po or IV every 8hr prn for agitation, anxiety, delirium.   If patient develops uncontrolled nausea would recommend scheduled reglan, zofran and compazine for support.   Also would recommend the use of octreotide, decadron, and high dose PPI or H2 antagonist if needed.   Palliative Medicine team will continue to support patient, family, and medical team during hospitalization.   Goals of Care and Additional Recommendations:  Limitations on Scope of Treatment: Full Scope Treatment  Code Status:    Code Status Orders  (From admission, onward)        Start     Ordered   08/29/2017 1732  Full code  Continuous     08/19/2017 1731    Code Status History    This patient has a current code status but no historical code status.     Prognosis:   Unable to determine-guarded to poor in the setting of poor  po intake, TPN initiation, decreased mobility, advanced peritoneal carcinomatosis, metastatic ovarian and neuroendocrine tumor, partial bowel obstruction, unsuccessful attempt for diverting colostomy, obstipation, hypertension, delirium,and hyperlipidemia.   Discharge Planning: To Be Determined - would recommend at minimum  outpatient Palliative if patient continues with aggressive measures (chemo/xrt).   Care plan was discussed with patient, family, and bedside RN.   Thank you for allowing the Palliative Medicine Team to assist in the care of this patient.   Time In: 1100 Time Out: 1230  Total Time:    90 min.  Prolonged Time Billed  Yes       Greater than 50%  of this time was spent counseling and coordinating care related to the above assessment and plan.  Alda Lea, NP-BC Palliative Medicine Team  Phone: [phone] Fax: [phone] Pager: [phone] Amion: Bjorn Pippin   Please contact Palliative Medicine Team phone at [phone] for questions and concerns.

## 2017-09-03 NOTE — Progress Notes (Signed)
[facility] [place] NAME: Alicia Conley    MR#:  [id number]  DATE OF BIRTH:  [birth year]  SUBJECTIVE:   Continues to be drowsy and confused.  Family at bedside  REVIEW OF SYSTEMS:  Review of Systems  Constitutional: Negative for chills, fever and weight loss.  HENT: Negative for ear discharge, ear pain and nosebleeds.   Eyes: Negative for blurred vision, pain and discharge.  Respiratory: Negative for sputum production, shortness of breath, wheezing and stridor.   Cardiovascular: Negative for chest pain, palpitations, orthopnea and PND.  Gastrointestinal: Negative for abdominal pain, diarrhea, nausea and vomiting.  Genitourinary: Negative for frequency and urgency.  Musculoskeletal: Negative for back pain and joint pain.  Neurological: Positive for weakness. Negative for sensory change, speech change and focal weakness.  Psychiatric/Behavioral: Negative for depression and hallucinations. The patient is not nervous/anxious.    DRUG ALLERGIES:  No Known Allergies  VITALS:  Blood pressure (!) 172/64, pulse 89, temperature 98.2 F (36.8 C), temperature source Oral, resp. rate 19, height 5\' 5"  (1.651 m), weight 134.4 kg (296 lb 4.8 oz), SpO2 95 %.  PHYSICAL EXAMINATION:  GENERAL:  77 y.o.-year-old patient lying in the bed. obese EYES: Pupils equal, round, reactive to light and accommodation. No scleral icterus. Extraocular muscles intact.  HEENT: Head atraumatic, normocephalic. Oropharynx and nasopharynx clear.  NECK:  Supple, no jugular venous distention. No thyroid enlargement, no tenderness.  LUNGS: Bilateral wheezing and crackles CARDIOVASCULAR: S1, S2 normal. No murmurs, rubs, or gallops.  ABDOMEN: Soft.  Wound VAC in place EXTREMITIES: ++ pedal edema,no cyanosis, or clubbing.  NEUROLOGIC: Cranial nerves II through XII are intact. Muscle strength 5/5 in all extremities. Sensation intact. Gait not checked.  PSYCHIATRIC: The patient is  drowzy.  Restless SKIN: No obvious rash, lesion, or ulcer.   Physical Exam LABORATORY PANEL:   CBC Recent Labs  Lab 09/03/17 0533  WBC 22.1*  HGB 9.2*  HCT 28.2*  PLT 381   ------------------------------------------------------------------------------------------------------------------  Chemistries  Recent Labs  Lab 09/03/17 0533  NA 142  K 3.7  CL 112*  CO2 20*  GLUCOSE 201*  BUN 53*  CREATININE 1.19*  CALCIUM 7.9*  MG 2.1  AST 61*  ALT 27  ALKPHOS 157*  BILITOT 0.5   ------------------------------------------------------------------------------------------------------------------  Cardiac Enzymes No results for input(s): TROPONINI in the last 168 hours. ------------------------------------------------------------------------------------------------------------------  RADIOLOGY:  Dg Abd 1 View  Result Date: 09/01/2017 CLINICAL DATA:  Carcinomatosis with large-bowel obstruction. Bilious vomiting. EXAM: ABDOMEN - 1 VIEW COMPARISON:  CT scan August 29, 2017 FINDINGS: There is a feathery pattern of gas identified over the abdomen. Some of this gas extend outside of the abdominal cavity. Based on previous CT imaging, I suspect this represents increasing gas in the subcutaneous tissues. This significantly obscures evaluation of the abdomen. No convincing evidence of free air. A stent and tube again project over the pelvis/lower abdomen. Contrast is seen in the rectum. Hernia mesh is noted. No definitive distended bowel to suggest ongoing obstruction. IMPRESSION: 1. The feathery pattern of air diffusely over the lower chest and abdomen suggests increasing air in the subcutaneous fat. This significantly obscures the remainder of the abdomen but there is no definitive bowel dilatation to suggest ongoing obstruction. Electronically Signed   By: Dorise Bullion III M.D   On: 09/01/2017 18:50    ASSESSMENT AND PLAN:  77 year old female patient with history of hypertension,  hyperlipidemia, hypothyroidism referred from GI clinic for direct admission for abdominal discomfort.  *  Acute hypoxic resp failure Aspiration? Bronchitis? Pulm edema. Suggested CXR to family but they had refused On steroids, nebs Was on ventilator earlier due to aspiration episode  * abdominal , Low grade serous carcinoma - heavy tumor burden -Status post attempted diverting colostomy on August 25, 2017 which was aborted given severe carcinomatosis-sample  taken and sent to lab -s/p colonic stent placement  by gastroenterology/Dr. Alice Reichert Discussed with Dr. 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 Fluconazole added today  * Hypokalemia and hypomagnesemia Resolved  * Chronic benign essential hypertension Continue current regiment  * ChronicHyperlipidemia Resume statinupon discharge  Prognosis poor  On TPN   All the records are reviewed and case discussed with Care Management/Social Worker Management plans discussed with the patient, family and they are in agreement.  CODE STATUS: Full  TOTAL TIME TAKING CARE OF THIS PATIENT: 25 minutes.   Neita Carp M.D on 09/03/2017   Between 7am to 6pm - Pager - [phone]  After 6pm go to [email or web address] - password [email or web address] [place] [facility]  Office  [phone]  CC: Primary care physician; [name], MD  Note: This dictation was prepared with Dragon dictation along with smaller phrase technology. Any transcriptional errors that result from this process are unintentional.

## 2017-09-03 NOTE — Consult Note (Addendum)
[place] Nurse wound follow up Patient currently receiving care in [facility] room 219.   Wound type: Surgical wound with VAC therapy The patient's granddaughter, whom I met yesterday and who helped me by being present to calm the patient during the NPWT dressing application on 11/09/27, stuck up a conversation with me.  During the course of the conversation she told me the [facility] had stool coming into the cannister, that the surgeon was aware, and that it started at some point last night.  I went to the room and observed what appeared consistent with fecal material in the [facility] dressing tubing and cannister. Per Dr. Corlis Leak note dated 09/03/17, "Currently she may have colonic fistula and we will treat it w A/bs , VAC Will add antifungals at the request of the family and given her critical illness No role for SURGICAL INTERVENTION"  I called Dr. Dahlia Byes and discussed the situation with him.  I explained that applying NPWT to an unexplored fistula is a contraindication to NPWT.  He stated he understood what I was saying, but use of the therapy was in the patient's best interest.  He asked about alternatives. I offered use of  an Eakin wound pouch with or without Red Robin catheter suction, as well as a saline moistened gauze dressing suggestion.  He declinded these suggestions.  He indicated he may switch to the Eakin pouch should the VAC foam fail to provide negative pressure therapy from clogging of the foam due to the material it is withdrawing from the abdominal cavity.   Upon completion of our conversation, I updated the family present in the room, of which there were many.  Val Riles, RN, MSN, CWOCN, CNS-BC, pager [phone]

## 2017-09-03 NOTE — Telephone Encounter (Signed)
Cancer Genetics            Telegenetics Initial Visit    Patient Name: Alicia Conley Patient DOB: 1940-08-27 Patient Age: 77 y.o. Phone Call Date: 09/03/2017  Referring Provider: Earlie Server, MD  Reason for Visit: Evaluate for hereditary susceptibility to cancer    Assessment and Plan:  . Alicia Conley's history of carcinomatosis of gynecologic origin is not highly suggestive of a hereditary predisposition to cancer when considering her family history. She has a brother with prostate cancer, but no close relatives with breast cancer. The family reported breast cancer in a paternal aunt and in a maternal first cousin.   Mar[facility] Kitchen Testing is available to determine whether Alicia Conley has a pathogenic mutation. We discussed that this information will NOT impact her medical care at this time given that she is under palliative care. It may be informative for her family, though. A negative result will be reassuring for her daughter as well as Alicia Conley's siblings.  . Alicia Conley's family provided informed consent on her behalf as her next-of-kin. They understood that testing would need be paid for rather than requesting that insurance cover testing costs. The self-pay cost for testing is $250. Analysis will include the 83 genes on [facility]'s Multi-Cancer panel (ALK, APC, ATM, AXIN2, BAP1, BARD1, BLM, BMPR1A, BRCA1, BRCA2, BRIP1, CASR, CDC73, CDH1, CDK4, CDKN1B, CDKN1C, CDKN2A, CEBPA, CHEK2, CTNNA1, DICER1, DIS3L2, EGFR, EPCAM, FH, FLCN, GATA2, GPC3, GREM1, HOXB13, HRAS, KIT, MAX, MEN1, MET, MITF, MLH1, MSH2, MSH3, MSH6, MUTYH, NBN, NF1, NF2, NTHL1, PALB2, PDGFRA, PHOX2B, PMS2, POLD1, POLE, POT1, PRKAR1A, PTCH1, PTEN, RAD50, RAD51C, RAD51D, RB1, RECQL4, RET, RUNX1, SDHA, SDHAF2, SDHB, SDHC, SDHD, SMAD4, SMARCA4, SMARCB1, SMARCE1, STK11, SUFU, TERC, TERT, TMEM127, TP53, TSC1, TSC2, VHL, WRN, WT1).   . I have sent a message to Dr. Tasia Catchings and Geraldine Solar to help coordinate an in-patient blood  draw. Once the lab receives her specimen, results should be available in approximately 1-2 weeks, at which point we will contact her family and address implications for them, if needed.      Dr. Grayland Ormond was available for questions concerning this case. Total time spent by counseling by phone was approximately 25 minutes.   _____________________________________________________________________   History of Present Illness: Alicia Conley, a 77 y.o. female, was referred for genetic counseling to discuss the possibility of a hereditary predisposition to cancer and discuss whether genetic testing is warranted. This was a telegenetics visit via phone that was done with her daughter, husband and her 2 granddaughters.  Alicia Conley was diagnosed with abdominal carcinomatosis, serous carcinoma, consistent with gyn origin. She is currently an in-patient at [facility] and is under palliative care.  Past Medical History:  Diagnosis Date  . Cancer ([place])   . High cholesterol   . Hypertension   . Thyroid disease     Past Surgical History:  Procedure Laterality Date  . BREAST BIOPSY Left 11/07/12   u/s bx lt -neg  . BREAST CYST ASPIRATION Left    neg  . COLONIC STENT PLACEMENT N/A 08/07/2017   Procedure: COLONIC STENT PLACEMENT;  Surgeon: Toledo, Benay Pike, MD;  Location: [facility] ENDOSCOPY;  Service: Gastroenterology;  Laterality: N/A;  . EP IMPLANTABLE DEVICE N/A 04/03/2016   Procedure: Loop Recorder Insertion;  Surgeon: Isaias Cowman, MD;  Location: [place] [facility];  Service: Cardiovascular;  Laterality: N/A;  . FLEXIBLE SIGMOIDOSCOPY N/A 08/24/2017   Procedure: FLEXIBLE SIGMOIDOSCOPY;  Surgeon: Toledo, Benay Pike, MD;  Location: [facility] ENDOSCOPY;  Service: Gastroenterology;  Laterality: N/A;  . FLEXIBLE SIGMOIDOSCOPY N/A 08/17/2017   Procedure: FLEXIBLE SIGMOIDOSCOPY;  Surgeon: Toledo, Benay Pike, MD;  Location: [facility] ENDOSCOPY;  Service: Gastroenterology;  Laterality: N/A;  . HERNIA REPAIR    .  LAPAROTOMY N/A 08/12/2017   Procedure: EXPLORATORY LAPAROTOMY;  Surgeon: Jules Husbands, MD;  Location: [facility] ORS;  Service: General;  Laterality: N/A;  . OOPHORECTOMY Bilateral     Family History: Significant diagnoses include the following:  Family History  Problem Relation Age of Onset  . Breast cancer Paternal Aunt 16       currently 67s  . Breast cancer Cousin 37       daughter of unaffected maternal aunt; currently 30  . Prostate cancer Brother        currently 80s  . Leukemia Cousin 72       son of paternal aunt with breast cancer; currently 86    Additionally, Alicia Conley has 2 brothers and a sister (ages 39s-70s). Her mother died at 45, cancer-free. Her father died in his 39s, cancer-free. Both parents had siblings, but Alicia Conley's daughter did not know the specifics of how many.  Alicia Conley ancestry is Caucasian - NOS. There is no known Jewish ancestry and no consanguinity.  Discussion: We reviewed the characteristics, features and inheritance patterns of hereditary cancer syndromes. We discussed her risk of harboring a mutation in the context of her personal and family history. We discussed the process of genetic testing, insurance coverage and implications of results: positive, negative and variant of uncertain significance (VUS).   Alicia Conley family asked questions, which were answered to their satisfaction. They are welcome to call with any additional questions or concerns. Thank you for the referral and allowing [place] to share in the care of your patient.    Steele Berg, MS, [place] Certified Genetic Counselor phone: [phone]

## 2017-09-03 NOTE — Progress Notes (Signed)
Pharmacy Antibiotic Note  Alicia Conley is a 77 y.o. female admitted on 08/31/2017 with intra abdominal infection .  Pharmacy has been consulted for fluconazole dosing.  Plan: fluconazole 800mg  iv x 1, followed by fluconazole 400mg  iv daily thereafter.   Height: 5\' 5"  (165.1 cm) Weight: 296 lb 4.8 oz (134.4 kg) IBW/kg (Calculated) : 57  Temp (24hrs), Avg:98 F (36.7 C), Min:97.7 F (36.5 C), Max:98.2 F (36.8 C)  Recent Labs  Lab 08/29/17 0516 08/30/17 0605 08/31/17 0617 09/01/17 0649 09/02/17 0305 09/03/17 0533  WBC 17.4* 19.2*  --  21.0* 21.5* 22.1*  CREATININE 1.15* 1.20* 1.14* 1.02* 1.07* 1.19*    Estimated Creatinine Clearance: 55.9 mL/min (A) (by C-G formula based on SCr of 1.19 mg/dL (H)).    No Known Allergies  Antimicrobials this admission: Anti-infectives (From admission, onward)   Start     Dose/Rate Route Frequency Ordered Stop   09/04/17 1000  fluconazole (DIFLUCAN) IVPB 400 mg     400 mg 100 mL/hr over 120 Minutes Intravenous Daily 09/03/17 1239     09/03/17 1500  fluconazole (DIFLUCAN) IVPB 400 mg  Status:  Discontinued     400 mg 100 mL/hr over 120 Minutes Intravenous  Once 09/03/17 1243 09/03/17 1245   09/03/17 1300  fluconazole (DIFLUCAN) IVPB 400 mg  Status:  Discontinued     400 mg 100 mL/hr over 120 Minutes Intravenous  Once 09/03/17 1243 09/03/17 1245   09/03/17 1245  fluconazole (DIFLUCAN) IVPB 800 mg  Status:  Discontinued     800 mg 100 mL/hr over 240 Minutes Intravenous  Once 09/03/17 1239 09/03/17 1243   08/31/17 1800  vancomycin (VANCOCIN) 1,500 mg in sodium chloride 0.9 % 500 mL IVPB     1,500 mg 250 mL/hr over 120 Minutes Intravenous Every 24 hours 08/31/17 1044     08/31/17 1200  vancomycin (VANCOCIN) IVPB 1000 mg/200 mL premix     1,000 mg 200 mL/hr over 60 Minutes Intravenous  Once 08/31/17 1044 08/31/17 1202   08/30/17 1045  Ampicillin-Sulbactam (UNASYN) 3 g in sodium chloride 0.9 % 100 mL IVPB     3 g 200 mL/hr over 30 Minutes  Intravenous Every 6 hours 08/30/17 1034     08/22/2017 1800  piperacillin-tazobactam (ZOSYN) IVPB 3.375 g  Status:  Discontinued     3.375 g 12.5 mL/hr over 240 Minutes Intravenous Every 8 hours 08/24/2017 1637 08/26/17 0126   08/11/2017 1730  piperacillin-tazobactam (ZOSYN) IVPB 3.375 g  Status:  Discontinued     3.375 g 12.5 mL/hr over 240 Minutes Intravenous Every 8 hours 08/06/2017 1728 08/26/17 0136   09/01/2017 1445  piperacillin-tazobactam (ZOSYN) IVPB 3.375 g     3.375 g 100 mL/hr over 30 Minutes Intravenous  Once 08/04/2017 1437 08/09/2017 1516   08/24/17 1800  cefOXitin (MEFOXIN) 2 g in sodium chloride 0.9 % 100 mL IVPB  Status:  Discontinued     2 g 200 mL/hr over 30 Minutes Intravenous Every 6 hours 08/24/17 1606 08/26/2017 0844       Microbiology results: Recent Results (from the past 240 hour(s))  MRSA PCR Screening     Status: None   Collection Time: 08/30/17  3:45 PM  Result Value Ref Range Status   MRSA by PCR NEGATIVE NEGATIVE Final    Comment:        The GeneXpert MRSA Assay (FDA approved for NASAL specimens only), is one component of a comprehensive MRSA colonization surveillance program. It is not intended to diagnose MRSA  infection nor to guide or monitor treatment for MRSA infections. Performed at [facility], [address] [place], [place] [address]      Thank you for allowing pharmacy to be a part of this patient's care.  Donna Christen Braian Tijerina 09/03/2017 12:44 PM

## 2017-09-03 NOTE — Progress Notes (Signed)
[facility] Gastroenterology Inpatient Progress Note  Subjective: Patient seen for f/u colonic stent placement on 08/18/2017. Passing copious amounts of stool per rectum but also through colonic decompression tube.Still confused and somewhat hypoxic. Recent events noted with Palliative care consult and DNR status placed.  Objective: Vital signs in last 24 hours: Temp:  [97.7 F (36.5 C)-98.2 F (36.8 C)] 98.2 F (36.8 C) (07/02 1100) Pulse Rate:  [82-94] 89 (07/02 1100) Resp:  [19-24] 19 (07/02 1100) BP: (169-184)/(60-65) 172/64 (07/02 1100) SpO2:  [94 %-99 %] 95 % (07/02 1100) Blood pressure (!) 172/64, pulse 89, temperature 98.2 F (36.8 C), temperature source Oral, resp. rate 19, height 5' 5"  (1.651 m), weight 134.4 kg (296 lb 4.8 oz), SpO2 95 %.    Intake/Output from previous day: 07/01 0701 - 07/02 0700 In: 1472 [P.O.:360; I.V.:1112] Out: 2760 [Urine:2700; Drains:60]  Intake/Output this shift: Total I/O In: 120 [P.O.:120] Out: 950 [Urine:950]   General appearance:  Mild dyspnea, responds to voice. Confused. Resp:  Coarse BS's bilaterally. Cardio:  Sl tachy  GI:  Distended with wound vac Extremities:  Trace edema.   Lab Results: Results for orders placed or performed during the hospital encounter of 08/31/2017 (from the past 24 hour(s))  Glucose, capillary     Status: Abnormal   Collection Time: 09/02/17  8:17 PM  Result Value Ref Range   Glucose-Capillary 146 (H) 70 - 99 mg/dL  Glucose, capillary     Status: Abnormal   Collection Time: 09/02/17 11:51 PM  Result Value Ref Range   Glucose-Capillary 187 (H) 70 - 99 mg/dL  Glucose, capillary     Status: Abnormal   Collection Time: 09/03/17  4:12 AM  Result Value Ref Range   Glucose-Capillary 188 (H) 70 - 99 mg/dL   Comment 1 Notify RN   Phosphorus     Status: Abnormal   Collection Time: 09/03/17  5:33 AM  Result Value Ref Range   Phosphorus 6.4 (H) 2.5 - 4.6 mg/dL  Magnesium     Status: None   Collection Time:  09/03/17  5:33 AM  Result Value Ref Range   Magnesium 2.1 1.7 - 2.4 mg/dL  TSH     Status: None   Collection Time: 09/03/17  5:33 AM  Result Value Ref Range   TSH 0.889 0.350 - 4.500 uIU/mL  Comprehensive metabolic panel     Status: Abnormal   Collection Time: 09/03/17  5:33 AM  Result Value Ref Range   Sodium 142 135 - 145 mmol/L   Potassium 3.7 3.5 - 5.1 mmol/L   Chloride 112 (H) 98 - 111 mmol/L   CO2 20 (L) 22 - 32 mmol/L   Glucose, Bld 201 (H) 70 - 99 mg/dL   BUN 53 (H) 8 - 23 mg/dL   Creatinine, Ser 1.19 (H) 0.44 - 1.00 mg/dL   Calcium 7.9 (L) 8.9 - 10.3 mg/dL   Total Protein 5.8 (L) 6.5 - 8.1 g/dL   Albumin 1.8 (L) 3.5 - 5.0 g/dL   AST 61 (H) 15 - 41 U/L   ALT 27 0 - 44 U/L   Alkaline Phosphatase 157 (H) 38 - 126 U/L   Total Bilirubin 0.5 0.3 - 1.2 mg/dL   GFR calc non Af Amer 43 (L) >60 mL/min   GFR calc Af Amer 50 (L) >60 mL/min   Anion gap 10 5 - 15  CBC with Differential/Platelet     Status: Abnormal   Collection Time: 09/03/17  5:33 AM  Result Value Ref  Range   WBC 22.1 (H) 3.6 - 11.0 K/uL   RBC 3.12 (L) 3.80 - 5.20 MIL/uL   Hemoglobin 9.2 (L) 12.0 - 16.0 g/dL   HCT 28.2 (L) 35.0 - 47.0 %   MCV 90.4 80.0 - 100.0 fL   MCH 29.7 26.0 - 34.0 pg   MCHC 32.8 32.0 - 36.0 g/dL   RDW 16.2 (H) 11.5 - 14.5 %   Platelets 381 150 - 440 K/uL   Neutrophils Relative % 91 %   Neutro Abs 20.2 (H) 1.4 - 6.5 K/uL   Lymphocytes Relative 4 %   Lymphs Abs 0.8 (L) 1.0 - 3.6 K/uL   Monocytes Relative 5 %   Monocytes Absolute 1.1 (H) 0.2 - 0.9 K/uL   Eosinophils Relative 0 %   Eosinophils Absolute 0.0 0 - 0.7 K/uL   Basophils Relative 0 %   Basophils Absolute 0.0 0 - 0.1 K/uL  Glucose, capillary     Status: Abnormal   Collection Time: 09/03/17  7:50 AM  Result Value Ref Range   Glucose-Capillary 141 (H) 70 - 99 mg/dL  Glucose, capillary     Status: Abnormal   Collection Time: 09/03/17 11:36 AM  Result Value Ref Range   Glucose-Capillary 155 (H) 70 - 99 mg/dL  Vancomycin,  trough     Status: Abnormal   Collection Time: 09/03/17  4:14 PM  Result Value Ref Range   Vancomycin Tr 22 (HH) 15 - 20 ug/mL  Glucose, capillary     Status: Abnormal   Collection Time: 09/03/17  4:43 PM  Result Value Ref Range   Glucose-Capillary 133 (H) 70 - 99 mg/dL     Recent Labs    09/01/17 0649 09/02/17 0305 09/03/17 0533  WBC 21.0* 21.5* 22.1*  HGB 9.7* 9.9* 9.2*  HCT 30.3* 30.6* 28.2*  PLT 401 420 381   BMET Recent Labs    09/01/17 0649 09/02/17 0305 09/03/17 0533  NA 141 142 142  K 3.7 3.9 3.7  CL 110 113* 112*  CO2 22 22 20*  GLUCOSE 124* 118* 201*  BUN 51* 49* 53*  CREATININE 1.02* 1.07* 1.19*  CALCIUM 8.2* 8.1* 7.9*   LFT Recent Labs    09/03/17 0533  PROT 5.8*  ALBUMIN 1.8*  AST 61*  ALT 27  ALKPHOS 157*  BILITOT 0.5   PT/INR No results for input(s): LABPROT, INR in the last 72 hours. Hepatitis Panel No results for input(s): HEPBSAG, HCVAB, HEPAIGM, HEPBIGM in the last 72 hours. C-Diff No results for input(s): CDIFFTOX in the last 72 hours. No results for input(s): CDIFFPCR in the last 72 hours.   Studies/Results: No results found.  Scheduled Inpatient Medications:   . acetaminophen  1,000 mg Oral Q6H  . aspirin EC  81 mg Oral Daily  . atorvastatin  80 mg Oral Daily  . chlorhexidine gluconate (MEDLINE KIT)  15 mL Mouth Rinse BID  . enoxaparin (LOVENOX) injection  40 mg Subcutaneous Q24H  . feeding supplement  1 Container Oral TID BM  . insulin aspart  0-9 Units Subcutaneous Q4H  . ipratropium-albuterol  3 mL Nebulization Q6H  . levothyroxine  75 mcg Oral QAC breakfast  . methylPREDNISolone (SOLU-MEDROL) injection  60 mg Intravenous Q12H  . metoprolol tartrate  100 mg Oral BID  . sertraline  100 mg Oral Daily  . sodium chloride flush  10-40 mL Intracatheter Q12H  . traMADol  50 mg Oral Q6H    Continuous Inpatient Infusions:   . famotidine (PEPCID) IV  Stopped (09/02/17 2245)  . [START ON 09/04/2017] fluconazole (DIFLUCAN) IV     . fluconazole (DIFLUCAN) IV 400 mg (09/03/17 1605)  . piperacillin-tazobactam (ZOSYN)  IV    . TPN (CLINIMIX) Adult without lytes 83 mL/hr at 09/02/17 1828  . TPN (CLINIMIX) Adult without lytes    . vancomycin Stopped (09/02/17 2036)    PRN Inpatient Medications:  albuterol, alum & mag hydroxide-simeth, bisacodyl, fluticasone, haloperidol lactate **OR** haloperidol, hydrALAZINE, HYDROmorphone (DILAUDID) injection, ketorolac, ondansetron **OR** ondansetron (ZOFRAN) IV, sodium chloride flush  Miscellaneous: KUB - Subcutaneous gas present without obvious colonic obstruction.  Assessment:  1. Colonic obstruction s/p laparotomy with colonic decompression tube. S/p colonic stent placement. 2. Abdominal wound cellulitis - on multiple IV antibiotics. 3. Morbid obesity 4. 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 5. Poor prognosis. Still on TPN. Not eating well.  Plan:  1. Continue symptomatic therapy per primary team and palliative care. 2. Following.  Saudia Smyser K. Alice Reichert, M.D. 09/03/2017, 5:16 PM

## 2017-09-03 NOTE — Progress Notes (Signed)
[place] NOTE   Pharmacy Consult for TPN Indication: Inadequate oral intake related to poor appetite, nausea, vomiting   Patient Measurements: Height: 5\' 5"  (165.1 cm) Weight: 296 lb 4.8 oz (134.4 kg) IBW/kg (Calculated) : 57 TPN AdjBW (KG): 74.5 Body mass index is 49.31 kg/m.  Assessment:  77 yo female with distal bowel obstruction.   TPN Access: central line in place TPN start date: 08/27/17 Nutritional Goals (per RD recommendation on 08/27/17): KCal: 2014 Protein: 100g Fluid: 2292  Goal TPN rate is 83 ml/hr  Plan:  Patient phos has become elevated, RD recommends removing electrolytes. Will remove electrolytes from TPN and supplement outside the the TPN tomorrow morning based on need. 7/2 update; Spoke with RD, will not reorder fats tonight due to intralipid having 2mmol of phos in it. Will look at phos tomorrow 7/3 to determine course. Patient has poor prognosis per physician.   Clinimix plain 5/15 TPN at 45mL/hr. 20% lipids at 60mL/hr for 12 hours.  This TPN provides 100 g of protein, 150 g of dextrose, and 60 g of lipids which provides 1776 kCals per day, meeting 90% of patient needs Electrolytes not in TPN: Phos elevated at 5.5 - monitor/reevaluate in am Add MVI, trace elements SSI/24 hrs: 5 units Clear liquid diet ordered and patient tolerating so far. Monitor TPN labs  Thomasenia Sales, PharmD 09/03/2017,2:43 PM

## 2017-09-03 NOTE — Progress Notes (Signed)
Physical Therapy Treatment Patient Details Name: AZAELA CARACCI MRN: [id number] DOB: 09/22/1940 Today's Date: 09/03/2017    History of Present Illness Jenae Tomasello  is a 77 y.o. female with a known history of hyperlipidemia, hypertension, hypothyroidism presented to the emergency room with abdominal pain.  The abdominal pain is going on for the last 2-1/2 weeks abdominal pain started 2 end of weeks ago and patient had been worked up with [facility] with a CT abdomen which revealed partial distal colonic obstruction.  The colon was filled up with stool to.  She was seen at our ER on Saturday and was evaluated and discharged back to home.  Patient still has abdominal discomfort and unable to eat and drink any fluids and and saw gastroenterologist Dr. Alice Reichert in the clinic today.  Dr Alice Reichert referred the patient for direct admission to the hospital.  No abdominal pain is generalized 4 out of 10 on a scale of 1-10.  Last bowel movement more than 1 week ago. She is now s/p laparotomy with decompression secondary to distal large bowel obstruction due to acute peritoneal carcinomatosis. She currently has a foley catheter, JP drain, and colon drainage tube.    PT Comments    Pt in bed, moaning with daughter and husband in room.  Pt requesting a bath.  Nurse tech in and assisted her with bed mobility skills to increase overall strength and participation in tasks.  Pt requires mod/max a x 2 for rolling left and right with encouragement to initiate.  Dependant +2 to reposition in bed.  Primary nurse in to change dressing on colon drain that was leaking stool.  Pt generally fatigued with tasks.  Left with nurse tech to finish care.   Follow Up Recommendations  SNF     Equipment Recommendations       Recommendations for Other Services       Precautions / Restrictions Precautions Precautions: Fall Precaution Comments: JP drain, wound vac, catheter Restrictions Weight Bearing Restrictions:  No    Mobility  Bed Mobility Overal bed mobility: Needs Assistance Bed Mobility: Rolling Rolling: Mod assist;Max assist;+2 for physical assistance            Transfers                    Ambulation/Gait                 Stairs             Wheelchair Mobility    Modified Rankin (Stroke Patients Only)       Balance                                            Cognition Arousal/Alertness: Awake/alert Behavior During Therapy: Restless Overall Cognitive Status: Within Functional Limits for tasks assessed                                        Exercises Other Exercises Other Exercises: session focused on repositioning in bed and rolling left and right for bathing with nurse tech    General Comments        Pertinent Vitals/Pain Pain Assessment: Faces Faces Pain Scale: Hurts whole lot Pain Location: Abdomen Pain Descriptors / Indicators: Operative site guarding Pain Intervention(s): Monitored during  session;Limited activity within patient's tolerance    Home Living                      Prior Function            PT Goals (current goals can now be found in the care plan section) Progress towards PT goals: Not progressing toward goals - comment    Frequency    Min 2X/week      PT Plan Current plan remains appropriate    Co-evaluation              AM-PAC PT "6 Clicks" Daily Activity  Outcome Measure  Difficulty turning over in bed (including adjusting bedclothes, sheets and blankets)?: Unable Difficulty moving from lying on back to sitting on the side of the bed? : Unable Difficulty sitting down on and standing up from a chair with arms (e.g., wheelchair, bedside commode, etc,.)?: Unable Help needed moving to and from a bed to chair (including a wheelchair)?: Total Help needed walking in hospital room?: Total Help needed climbing 3-5 steps with a railing? : Total 6 Click Score:  6    End of Session   Activity Tolerance: Patient limited by pain Patient left: in bed;with nursing/sitter in room         Time: 0918-0949 PT Time Calculation (min) (ACUTE ONLY): 31 min  Charges:  $Gait Training: 8-22 mins $Therapeutic Exercise: 8-22 mins                    G Codes:       Chesley Noon, PTA 09/03/17, 10:05 AM

## 2017-09-03 NOTE — Progress Notes (Addendum)
CC:  Subjective: Wound vac placed on wound. Still delirious and with some hypoxemia  Objective: Vital signs in last 24 hours: Temp:  [97.7 F (36.5 C)-98.2 F (36.8 C)] 97.7 F (36.5 C) (07/01 2022) Pulse Rate:  [78-94] 87 (07/02 0423) Resp:  [20-24] 20 (07/02 0423) BP: (169-184)/(60-70) 184/60 (07/02 0423) SpO2:  [94 %-99 %] 94 % (07/02 0955) Last BM Date: 09/01/17  Intake/Output from previous day: 07/01 0701 - 07/02 0700 In: 1472 [P.O.:360; I.V.:1112] Out: 2760 [Urine:2700; Drains:60] Intake/Output this shift: No intake/output data recorded.  Physical exam:  Calm, disoriented Abd: soft, nt, wound vac in place, colonic tube in place w some stool around it. JP serous fluid. Cellulitis somewhat improving There is some stool like content on the VAC canister  Lab Results: CBC  Recent Labs    09/02/17 0305 09/03/17 0533  WBC 21.5* 22.1*  HGB 9.9* 9.2*  HCT 30.6* 28.2*  PLT 420 381   BMET Recent Labs    09/02/17 0305 09/03/17 0533  NA 142 142  K 3.9 3.7  CL 113* 112*  CO2 22 20*  GLUCOSE 118* 201*  BUN 49* 53*  CREATININE 1.07* 1.19*  CALCIUM 8.1* 7.9*   PT/INR No results for input(s): LABPROT, INR in the last 72 hours. ABG No results for input(s): PHART, HCO3 in the last 72 hours.  Invalid input(s): PCO2, PO2  Studies/Results: Dg Abd 1 View  Result Date: 09/01/2017 CLINICAL DATA:  Carcinomatosis with large-bowel obstruction. Bilious vomiting. EXAM: ABDOMEN - 1 VIEW COMPARISON:  CT scan August 29, 2017 FINDINGS: There is a feathery pattern of gas identified over the abdomen. Some of this gas extend outside of the abdominal cavity. Based on previous CT imaging, I suspect this represents increasing gas in the subcutaneous tissues. This significantly obscures evaluation of the abdomen. No convincing evidence of free air. A stent and tube again project over the pelvis/lower abdomen. Contrast is seen in the rectum. Hernia mesh is noted. No definitive distended  bowel to suggest ongoing obstruction. IMPRESSION: 1. The feathery pattern of air diffusely over the lower chest and abdomen suggests increasing air in the subcutaneous fat. This significantly obscures the remainder of the abdomen but there is no definitive bowel dilatation to suggest ongoing obstruction. Electronically Signed   By: Dorise Bullion III M.D   On: 09/01/2017 18:50    Anti-infectives: Anti-infectives (From admission, onward)   Start     Dose/Rate Route Frequency Ordered Stop   08/31/17 1800  vancomycin (VANCOCIN) 1,500 mg in sodium chloride 0.9 % 500 mL IVPB     1,500 mg 250 mL/hr over 120 Minutes Intravenous Every 24 hours 08/31/17 1044     08/31/17 1200  vancomycin (VANCOCIN) IVPB 1000 mg/200 mL premix     1,000 mg 200 mL/hr over 60 Minutes Intravenous  Once 08/31/17 1044 08/31/17 1202   08/30/17 1045  Ampicillin-Sulbactam (UNASYN) 3 g in sodium chloride 0.9 % 100 mL IVPB     3 g 200 mL/hr over 30 Minutes Intravenous Every 6 hours 08/30/17 1034     08/08/2017 1800  piperacillin-tazobactam (ZOSYN) IVPB 3.375 g  Status:  Discontinued     3.375 g 12.5 mL/hr over 240 Minutes Intravenous Every 8 hours 08/15/2017 1637 08/26/17 0126   08/16/2017 1730  piperacillin-tazobactam (ZOSYN) IVPB 3.375 g  Status:  Discontinued     3.375 g 12.5 mL/hr over 240 Minutes Intravenous Every 8 hours 08/20/2017 1728 08/26/17 0136   08/17/2017 1445  piperacillin-tazobactam (ZOSYN) IVPB 3.375 g  3.375 g 100 mL/hr over 30 Minutes Intravenous  Once 08/20/2017 1437 08/27/2017 1516   08/24/17 1800  cefOXitin (MEFOXIN) 2 g in sodium chloride 0.9 % 100 mL IVPB  Status:  Discontinued     2 g 200 mL/hr over 30 Minutes Intravenous Every 6 hours 08/24/17 1606 08/09/2017 0844      Assessment/Plan:  Continue wound vac , antibiotics and TPN  Palliative care might be a good option at this time given her critical illness and poor prognosis. Spent 45 min face to face with the family and update. Poor prognosis. 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

## 2017-09-04 DIAGNOSIS — T8149XA Infection following a procedure, other surgical site, initial encounter: Secondary | ICD-10-CM

## 2017-09-04 LAB — GLUCOSE, CAPILLARY
GLUCOSE-CAPILLARY: 159 mg/dL — AB (ref 70–99)
GLUCOSE-CAPILLARY: 126 mg/dL — AB (ref 70–99)
Glucose-Capillary: 142 mg/dL — ABNORMAL HIGH (ref 70–99)
Glucose-Capillary: 143 mg/dL — ABNORMAL HIGH (ref 70–99)
Glucose-Capillary: 145 mg/dL — ABNORMAL HIGH (ref 70–99)
Glucose-Capillary: 162 mg/dL — ABNORMAL HIGH (ref 70–99)

## 2017-09-04 LAB — BASIC METABOLIC PANEL
Anion gap: 10 (ref 5–15)
BUN: 68 mg/dL — ABNORMAL HIGH (ref 8–23)
CALCIUM: 7.7 mg/dL — AB (ref 8.9–10.3)
CO2: 19 mmol/L — AB (ref 22–32)
CREATININE: 1.5 mg/dL — AB (ref 0.44–1.00)
Chloride: 112 mmol/L — ABNORMAL HIGH (ref 98–111)
GFR calc Af Amer: 38 mL/min — ABNORMAL LOW (ref >60)
GFR, EST NON AFRICAN AMERICAN: 33 mL/min — AB (ref >60)
Glucose, Bld: 174 mg/dL — ABNORMAL HIGH (ref 70–99)
Potassium: 3.7 mmol/L (ref 3.5–5.1)
SODIUM: 141 mmol/L (ref 135–145)

## 2017-09-04 LAB — CREATININE, SERUM
Creatinine, Ser: 1.43 mg/dL — ABNORMAL HIGH (ref 0.44–1.00)
GFR calc Af Amer: 40 mL/min — ABNORMAL LOW (ref >60)
GFR, EST NON AFRICAN AMERICAN: 35 mL/min — AB (ref >60)

## 2017-09-04 LAB — PHOSPHORUS: Phosphorus: 6.4 mg/dL — ABNORMAL HIGH (ref 2.5–4.6)

## 2017-09-04 LAB — MAGNESIUM: Magnesium: 2.1 mg/dL (ref 1.7–2.4)

## 2017-09-04 MED ORDER — TRACE MINERALS CR-CU-MN-SE-ZN 10-1000-500-60 MCG/ML IV SOLN
INTRAVENOUS | Status: AC
  Administered 2017-09-04: 18:00:00 via INTRAVENOUS
  Filled 2017-09-04: qty 2000

## 2017-09-04 MED ORDER — ENOXAPARIN SODIUM 40 MG/0.4ML ~~LOC~~ SOLN
40.0000 mg | Freq: Two times a day (BID) | SUBCUTANEOUS | Status: DC
  Administered 2017-09-04 – 2017-09-06 (×5): 40 mg via SUBCUTANEOUS
  Filled 2017-09-04 (×5): qty 0.4

## 2017-09-04 MED ORDER — FAT EMULSION PLANT BASED 20 % IV EMUL
250.0000 mL | INTRAVENOUS | Status: AC
  Administered 2017-09-04: 250 mL via INTRAVENOUS
  Filled 2017-09-04: qty 250

## 2017-09-04 MED ORDER — FLUCONAZOLE IN SODIUM CHLORIDE 200-0.9 MG/100ML-% IV SOLN
200.0000 mg | Freq: Every day | INTRAVENOUS | Status: DC
  Administered 2017-09-04 – 2017-09-05 (×2): 200 mg via INTRAVENOUS
  Filled 2017-09-04 (×3): qty 100

## 2017-09-04 NOTE — Consult Note (Addendum)
[place] Nurse wound follow up F/U care for wound assessment and NPWT dressing change performed in [facility].  The patient spoke a couple of times during my encounter and opened her eyes and made eye contact.  Her daughter Lovey Newcomer was present at the end of my encounter as was Dr. Dahlia Byes.  313ml of drainage was in the [facility] cannister-it was consistent with fecal material. Wound type: LLQ abdominal surgical incision Today when I removed the existing VAC sponge, approximately 3/4 of a cup of thick fecal material and bloody drainage ran out.  The patient was cleaned, new linens provided.  I lightly moistened a roll of kerlex with saline, packed a portion of it into the wound sufficient to fill the wound bed, and covered it with ABD pads which were taped in place.  I explained all of this to the patient's daughter and Dr. Dahlia Byes.  Dr. Dahlia Byes agrees to try pouching the wound with an [place] pouch, and if the pouching fails to contain the effluent, to go with packing the wound with kerlex and changing 4 times daily. Val Riles, RN, MSN, CWOCN, CNS-BC, pager [phone]

## 2017-09-04 NOTE — Progress Notes (Signed)
Patient dressing changed x 2 more times due to soiling. Notified Dr. Dahlia Byes that the tubing to the abdomen was clogged. Nurse attempted to disconnect tubing near patient and flush to bag to clear tubing x2, but the tubing seems to be clogging near end proximal to patient notified Dr. Dahlia Byes of this and per Dr. Dahlia Byes " continue to flush tubing to bag and monitor, this will likely re-occur with the tubing being used. Patient's granddaughter requested that the patient be given stool softener and laxative, that the tubing be changed out and that the provider be called. Dr. Alice Reichert also came in and spoke with the patient's family and told the granddaughter " giving her a stool softener or laxative is not going to help the situation it will only make matters worst and her problem is not that she is not having stools she is having stool and no matte what we do to the tubing it will continue to clog we can just continue to flush it". Patient's granddaughter also stated to Dr. Alice Reichert " I think that part of the problem is that when they are turning her to provide care that they are turning her on the tubing and this is supposed to be avoided". Dr. Alice Reichert alerted patient's granddaughter that the tubing is not kinked so there is not a problem there". Nurse also alerted patient's granddaughter to the fact that the patient will be partially on the tubing when turned and that the tubing is still stitched to the skin with no advancement or removal of tube". Patient's daughter also told granddaughter " well she turns on her stomach on her own too so". Will continue to monitor patient.

## 2017-09-04 NOTE — Progress Notes (Signed)
Patient's dressing to open wound on abdomen changed x2 today once by the wound care nurse and once by unit nurse. Wound oozing bowel from it, green in color. Wound packed with dry kerlix, covered with ABD pad and secured with paper tape. Patient tolerated well. Patient's family requested that Pepcid not be given, provider notified and medication discontinued.

## 2017-09-04 NOTE — Progress Notes (Addendum)
Hematology/Oncology Progress Note [facility] [phone] [phone]  Patient Care Team: [name], MD as PCP - General (Family Medicine) Clent Jacks, RN as Registered Nurse   Name of the patient: Alicia Conley  [id number]  22-Nov-1940  Date of visit: 09/04/17   INTERVAL HISTORY-  Patient was seen at bedside. Patient continues to be confused. Stool drainage through fistulous tract. Met Daughter.   Review of systems- Review of Systems  Unable to perform ROS: Mental status change    No Known Allergies  Patient Active Problem List   Diagnosis Date Noted  . Goals of care, counseling/discussion   . Colonic obstruction ([place])   . 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 Abdominal pain 08/10/2017  . Ileus ([place]) 08/04/2017     Past Medical History:  Diagnosis Date  . Cancer ([place])   . High cholesterol   . Hypertension   . Thyroid disease      Past Surgical History:  Procedure Laterality Date  . BREAST BIOPSY Left 11/07/12   u/s bx lt -neg  . BREAST CYST ASPIRATION Left    neg  . COLONIC STENT PLACEMENT N/A 08/17/2017   Procedure: COLONIC STENT PLACEMENT;  Surgeon: Toledo, Benay Pike, MD;  Location: [facility] ENDOSCOPY;  Service: Gastroenterology;  Laterality: N/A;  . EP IMPLANTABLE DEVICE N/A 04/03/2016   Procedure: Loop Recorder Insertion;  Surgeon: Isaias Cowman, MD;  Location: [facility];  Service: Cardiovascular;  Laterality: N/A;  . FLEXIBLE SIGMOIDOSCOPY N/A 08/06/2017   Procedure: FLEXIBLE SIGMOIDOSCOPY;  Surgeon: Toledo, Benay Pike, MD;  Location: [facility] ENDOSCOPY;  Service: Gastroenterology;  Laterality: N/A;  . FLEXIBLE SIGMOIDOSCOPY N/A 08/15/2017   Procedure: FLEXIBLE SIGMOIDOSCOPY;  Surgeon: Toledo, Benay Pike, MD;  Location: [facility] ENDOSCOPY;  Service: Gastroenterology;  Laterality: N/A;  . HERNIA REPAIR    . LAPAROTOMY N/A 08/30/2017   Procedure: EXPLORATORY LAPAROTOMY;  Surgeon: Jules Husbands, MD;   Location: [facility] ORS;  Service: General;  Laterality: N/A;  . OOPHORECTOMY Bilateral     Social History   Socioeconomic History  . Marital status: Married    Spouse name: Not on file  . Number of children: Not on file  . Years of education: Not on file  . Highest education level: Not on file  Occupational History  . Not on file  Social Needs  . Financial resource strain: Not on file  . Food insecurity:    Worry: Not on file    Inability: Not on file  . Transportation needs:    Medical: Not on file    Non-medical: Not on file  Tobacco Use  . Smoking status: Never Smoker  . Smokeless tobacco: Never Used  Substance and Sexual Activity  . Alcohol use: Never    Frequency: Never  . Drug use: Never  . Sexual activity: Never  Lifestyle  . Physical activity:    Days per week: Not on file    Minutes per session: Not on file  . Stress: Not on file  Relationships  . Social connections:    Talks on phone: Not on file    Gets together: Not on file    Attends religious service: Not on file    Active member of club or organization: Not on file    Attends meetings of clubs or organizations: Not on file    Relationship status: Not on file  . Intimate partner violence:    Fear of current or ex partner: Not on file  Emotionally abused: Not on file    Physically abused: Not on file    Forced sexual activity: Not on file  Other Topics Concern  . Not on file  Social History Narrative  . Not on file     Family History  Problem Relation Age of Onset  . Breast cancer Paternal Aunt 48       currently 62s  . Breast cancer Cousin 43       daughter of unaffected maternal aunt; currently 64  . Prostate cancer Brother        currently 58s  . Leukemia Cousin 45       son of paternal aunt with breast cancer; currently 66     Current Facility-Administered Medications:  .  acetaminophen (TYLENOL) tablet 1,000 mg, 1,000 mg, Oral, Q6H, Vickie Epley, MD, 1,000 mg at 09/04/17 0615 .   albuterol (PROVENTIL) (2.5 MG/3ML) 0.083% nebulizer solution 2.5 mg, 2.5 mg, Nebulization, Q3H PRN, Wilhelmina Mcardle, MD, 2.5 mg at 09/02/17 0827 .  alum & mag hydroxide-simeth (MAALOX/MYLANTA) 200-200-20 MG/5ML suspension 15 mL, 15 mL, Oral, Q4H PRN, Pabon, Diego F, MD, 15 mL at 09/01/17 1052 .  aspirin EC tablet 81 mg, 81 mg, Oral, Daily, Pabon, [place] F, MD, 81 mg at 09/04/17 0949 .  atorvastatin (LIPITOR) tablet 80 mg, 80 mg, Oral, Daily, Pabon, [place] F, MD, 80 mg at 09/04/17 0948 .  bisacodyl (DULCOLAX) suppository 10 mg, 10 mg, Rectal, Daily PRN, Pabon, Diego F, MD .  chlorhexidine gluconate (MEDLINE KIT) (PERIDEX) 0.12 % solution 15 mL, 15 mL, Mouth Rinse, BID, Blakeney, Dana G, NP, 15 mL at 09/04/17 0809 .  enoxaparin (LOVENOX) injection 40 mg, 40 mg, Subcutaneous, Q24H, Fritzi Mandes, MD, 40 mg at 09/04/17 0947 .  famotidine (PEPCID) IVPB 20 mg premix, 20 mg, Intravenous, Q12H, Vickie Epley, MD, Last Rate: 100 mL/hr at 09/04/17 0950, 20 mg at 09/04/17 0950 .  feeding supplement (BOOST / RESOURCE BREEZE) liquid 1 Container, 1 Container, Oral, TID BM, Vickie Epley, MD, 1 Container at 09/03/17 1242 .  fluconazole (DIFLUCAN) IVPB 400 mg, 400 mg, Intravenous, Daily, Coffee, Donna Christen, [facility] .  fluticasone (FLONASE) 50 MCG/ACT nasal spray 1 spray, 1 spray, Each Nare, Daily PRN, Pabon, Diego F, MD .  haloperidol lactate (HALDOL) injection 0.5 mg, 0.5 mg, Intravenous, Q8H PRN, 0.5 mg at 09/03/17 2143 **OR** haloperidol (HALDOL) tablet 0.5 mg, 0.5 mg, Oral, Q8H PRN, Pickenpack-Cousar, Athena N, NP .  hydrALAZINE (APRESOLINE) injection 10 mg, 10 mg, Intravenous, Q4H PRN, Pabon, Diego F, MD, 10 mg at 09/04/17 0649 .  HYDROmorphone (DILAUDID) injection 0.5 mg, 0.5 mg, Intravenous, Q6H PRN, Hillary Bow, MD, 0.5 mg at 09/04/17 0820 .  insulin aspart (novoLOG) injection 0-9 Units, 0-9 Units, Subcutaneous, Q4H, Salary, Montell D, MD, 2 Units at 09/04/17 0802 .  ipratropium-albuterol (DUONEB) 0.5-2.5  (3) MG/3ML nebulizer solution 3 mL, 3 mL, Nebulization, Q6H, Sudini, Srikar, MD, 3 mL at 09/04/17 0802 .  ketorolac (TORADOL) 30 MG/ML injection 15 mg, 15 mg, Intravenous, Q8H PRN, Hillary Bow, MD, 15 mg at 09/04/17 0956 .  levothyroxine (SYNTHROID, LEVOTHROID) tablet 75 mcg, 75 mcg, Oral, QAC breakfast, Pabon, [place] F, MD, 75 mcg at 09/04/17 0802 .  methylPREDNISolone sodium succinate (SOLU-MEDROL) 125 mg/2 mL injection 60 mg, 60 mg, Intravenous, Q12H, Sudini, Srikar, MD, 60 mg at 09/04/17 7846 .  metoprolol tartrate (LOPRESSOR) tablet 100 mg, 100 mg, Oral, BID, Pabon, Diego F, MD, 100 mg at 09/04/17 0948 .  ondansetron (ZOFRAN)  tablet 4 mg, 4 mg, Oral, Q6H PRN **OR** ondansetron (ZOFRAN) injection 4 mg, 4 mg, Intravenous, Q6H PRN, Pabon, Diego F, MD, 4 mg at 08/27/17 2048 .  piperacillin-tazobactam (ZOSYN) IVPB 3.375 g, 3.375 g, Intravenous, Q8H, Rocky Morel, RPH, Last Rate: 12.5 mL/hr at 09/04/17 0614, 3.375 g at 09/04/17 0614 .  sertraline (ZOLOFT) tablet 100 mg, 100 mg, Oral, Daily, Pabon, [place] F, MD, 100 mg at 09/04/17 0948 .  sodium chloride flush (NS) 0.9 % injection 10-40 mL, 10-40 mL, Intracatheter, Q12H, Salary, Montell D, MD, 10 mL at 09/04/17 1010 .  sodium chloride flush (NS) 0.9 % injection 10-40 mL, 10-40 mL, Intracatheter, PRN, Salary, Montell D, MD, 10 mL at 09/04/17 0650 .  TPN (CLINIMIX) Adult without lytes, , Intravenous, Continuous TPN, Coffee, Donna Christen [facility], Last Rate: 83 mL/hr at 09/03/17 1803 .  traMADol (ULTRAM) tablet 50 mg, 50 mg, Oral, Q6H, Sudini, Srikar, MD, 50 mg at 09/04/17 0615 .  vancomycin (VANCOCIN) 1,250 mg in sodium chloride 0.9 % 250 mL IVPB, 1,250 mg, Intravenous, Q24H, Rocky Morel, RPH, Stopped at 09/04/17 0125   Physical exam:  Vitals:   09/04/17 0204 09/04/17 0206 09/04/17 0449 09/04/17 0948  BP: (!) 175/96  (!) 182/75 (!) 152/57  Pulse: 76  81 64  Resp:   (!) 24   Temp:   (!) 97.4 F (36.3 C)   TempSrc:   Oral   SpO2:  97% 95%   Weight:    (!) 303 lb (137.4 kg)   Height:       Physical Exam  GENERAL confused,  sKIN: Abdominal wound cellulitis with wound VAC. LYMPH: No palpable cervical and axillary lymphadenopathy  LUNGS: wheezing HEART:  S1 normal and S2 normal  ABDOMEN: Abdomen open wound. Distended. EXTREMITIES: trace edema b/l LE.,  NEURO: confused.      CMP Latest Ref Rng & Units 09/04/2017  Glucose 70 - 99 mg/dL 174(H)  BUN 8 - 23 mg/dL 68(H)  Creatinine 0.44 - 1.00 mg/dL 1.50(H)  Sodium 135 - 145 mmol/L 141  Potassium 3.5 - 5.1 mmol/L 3.7  Chloride 98 - 111 mmol/L 112(H)  CO2 22 - 32 mmol/L 19(L)  Calcium 8.9 - 10.3 mg/dL 7.7(L)  Total Protein 6.5 - 8.1 g/dL -  Total Bilirubin 0.3 - 1.2 mg/dL -  Alkaline Phos 38 - 126 U/L -  AST 15 - 41 U/L -  ALT 0 - 44 U/L -   CBC Latest Ref Rng & Units 09/03/2017  WBC 3.6 - 11.0 K/uL 22.1(H)  Hemoglobin 12.0 - 16.0 g/dL 9.2(L)  Hematocrit 35.0 - 47.0 % 28.2(L)  Platelets 150 - 440 K/uL 381   RADIOGRAPHIC STUDIES: I have personally reviewed the radiological images as listed and agreed with the findings in the report. Ct Abdomen Pelvis Wo Contrast  Result Date: 08/29/2017 CLINICAL DATA:  Abdominal xray revealed collection of air below the left hemidiaphragm likely intraluminal, however unable to exclude a component of free peritoneal air radiologist recommended CT Abd Pelvis. Patient has abdominal pain. Medical record history reports a laparotomy on 08/19/2017. EXAM: CT ABDOMEN AND PELVIS WITHOUT CONTRAST TECHNIQUE: Multidetector CT imaging of the abdomen and pelvis was performed following the standard protocol without IV contrast. COMPARISON:  Current abdomen radiographs. Prior CT dated 08/22/2017. FINDINGS: Lower chest: Small pleural effusions. There is dependent lung base opacity, left greater than right, consistent with atelectasis. Heart is normal in size. Hepatobiliary: There are 4 low-density liver masses consistent with cysts, largest from the posterior segment  of  the right lobe measuring 5 cm. No other liver abnormalities. The gallbladder is distended. There is dependent density in the gallbladder consistent with sludge, stones or a combination. No wall thickening. No bile duct dilation. Pancreas: Unremarkable. No pancreatic ductal dilatation or surrounding inflammatory changes. Spleen: Normal in size without focal abnormality. Adrenals/Urinary Tract: No adrenal masses. Mild to moderate right hydronephrosis and hydroureter. Right ureter is dilated into the pelvis. It is decompressed beginning just above the posterior bladder. No left hydronephrosis. Left ureter is normal course and in caliber. No intrarenal stones. Hyperattenuating mass, average Hounsfield units of 39, arises from the upper pole the right kidney, likely a cyst complicated by hemorrhage or proteinaceous contents. It measures 15 mm. There is a smaller more subtle similar-appearing mass from the medial upper pole the left kidney. No other renal masses. Bladder is decompressed by Foley catheter. Stomach/Bowel: The distal sigmoid colon is irregular with adjacent nodular opacities, consistent with colon carcinoma. Above this irregular area of sigmoid colon lies a stent which extends from the mid sigmoid colon to the lower descending colon. There is a percutaneously placed 2 at enters the left mid abdomen. The tip of this 2 has a configuration consistent with a gastrostomy tube. However, this lies in the:. There is significant subcutaneous air adjacent to the tube in the left mid abdomen lateral to the umbilicus. This portion of the colon is mostly decompressed. Right colon is normal in caliber. An no inflammatory changes. Stomach is mild-to-moderately distended. There is dependent fluid with some dependent contrast. No wall thickening or adjacent inflammation. Small bowel is normal in caliber. There is free intraperitoneal air that is collecting along the anterior mid to upper abdomen, accounting for the  abnormality noted on the current radiographs. A trace amount of dense material lies adjacent to the spleen where it is in close proximity to the gastric fundus. Other small foci of dense material lie along the surface of the right liver lobe. These findings were present on the CT from 08/22/2017 and suggest peritoneal carcinomatosis. No ascites. Vascular/Lymphatic: No pathologically enlarged lymph nodes. Aortic atherosclerosis. Reproductive: Uterus is normal size and configuration. No ovarian/adnexal masses. Other: There is postsurgical scarring with apparent hernia mesh along the anterior abdominal midline stable from the prior CT. A surgical drainage catheter enters the left lower quadrant. Musculoskeletal: No acute fracture or acute finding. 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 No osteoblastic or osteolytic lesions. IMPRESSION: 1. There is free intraperitoneal air. This is greater than expected 4 days post laparotomy, but still could be postsurgical in origin. 2. There is no definite source for this air. However, there is a tube that has the appearance of a gastrostomy tube. This enters the left transverse colon. This is likely a colonic tube for decompression. There is air surrounding the tube in the subcutaneous soft tissues at its insertion site. This is a possible source of an air leak. 3. 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 4. There is mild to moderate right hydronephrosis and right hydroureter. The partial obstruction is at the level of the neoplastic changes around the sigmoid colon. This is also stable from prior CT. Electronically Signed   By: Lajean Manes M.D.   On: 08/29/2017 20:59   Dg Abd 1 View  Result Date: 09/01/2017 CLINICAL DATA:  Carcinomatosis with large-bowel obstruction. Bilious vomiting. EXAM: ABDOMEN - 1 VIEW  COMPARISON:  CT scan  August 29, 2017 FINDINGS: There is a feathery pattern of gas identified over the abdomen. Some of this gas extend outside of the abdominal cavity. Based on previous CT imaging, I suspect this represents increasing gas in the subcutaneous tissues. This significantly obscures evaluation of the abdomen. No convincing evidence of free air. A stent and tube again project over the pelvis/lower abdomen. Contrast is seen in the rectum. Hernia mesh is noted. No definitive distended bowel to suggest ongoing obstruction. IMPRESSION: 1. The feathery pattern of air diffusely over the lower chest and abdomen suggests increasing air in the subcutaneous fat. This significantly obscures the remainder of the abdomen but there is no definitive bowel dilatation to suggest ongoing obstruction. Electronically Signed   By: Dorise Bullion III M.D   On: 09/01/2017 18:50   Dg Abd 1 View  Result Date: 08/08/2017 CLINICAL DATA:  Initial evaluation for enteric tube placement. EXAM: ABDOMEN - 1 VIEW COMPARISON:  None. FINDINGS: Enteric tube in place with tip in side hole seen overlying the stomach, well beyond the GE junction. Tip projects distally. Visualized bowel gas pattern within normal limits. IMPRESSION: Enteric tube in place with tip and side hole overlying the stomach, well beyond the GE junction. Electronically Signed   By: Jeannine Boga M.D.   On: 08/12/2017 20:20   Ct Abdomen Pelvis W Contrast  Result Date: 08/22/2017 CLINICAL DATA:  Follow-up CT scan concerning for bowel obstruction. LEFT lower quadrant pain abdominal pain for 2 weeks. Last bowel movement 1 week ago. EXAM: CT ABDOMEN AND PELVIS WITH CONTRAST TECHNIQUE: Multidetector CT imaging of the abdomen and pelvis was performed using the standard protocol following bolus administration of intravenous contrast. CONTRAST:  46m ISOVUE-300 IOPAMIDOL (ISOVUE-300) INJECTION 61% COMPARISON:  CT 11/28/2013 FINDINGS: Lower chest: Lung bases are clear. Hepatobiliary: Small  nodular densities along the surface of the RIGHT hepatic lobe (image 28/2 image 33/2). Nodules measure from 3 to 5 mm are partially calcified. Sludge or stones in the gallbladder. Small amount pericholecystic fluid adjacent to fundus of the bladder. No biliary duct dilatation. The low several low-density cysts in the liver. Pancreas: Pancreas is normal. No ductal dilatation. No pancreatic inflammation. Spleen: Normal spleen Adrenals/urinary tract: Adrenal glands are normal. Several intermediate density or enhancing lesions the renal cortex. For example 14 mm lesion along the lateral margin of theRIGHT lower pole (image 44/2). Cortical lesion measuring 18 mm on the anterior upper pole on the LEFT (image 24/2). There is mild hydroureter on the RIGHT hydroureter extends to the level of the pelvis. There is peritoneal nodularity in the pelvis mesentery measuring 18 mm appears to be fanning out in the colonic mesentery (image 75/2.) This may obstruct the ureter. Bladder is normal Stomach/Bowel: Stomach, duodenum and small bowel are normal. The ascending, transverse and descending colon are fluid-filled and without haustral markings. This fluid-filled colon extends the level sigmoid colon with there is caliber change. This is in the region of the mesenteric nodularity described renal section. The sigmoid colon is narrowed with potential calcified peritoneal densities (image 73/2). Rectum normal Vascular/Lymphatic: Abdominal aorta is normal caliber. No periportal or retroperitoneal adenopathy. No pelvic adenopathy. Reproductive: Uterus normal.  Ovaries not identified Other: There is a ventral hernia repair. There is enhancing nodularity along the ventral peritoneal surface with a frondlike appearance (image 40/2 for example). Small frondlike cluster measures 21 mm (image 40/2). This is similar to the mesenteric nodularity in the pelvis as well as the nodularity along the RIGHT  hepatic lobe. Small calcified nodules also  extends along the LEFT pericolic gutter. Musculoskeletal: No aggressive osseous lesion. IMPRESSION: 1. The ascending, transverse and descending colon is ahaustral, fluid-filled, and mildly distended to the level of sigmoid colon. A caliber change at sigmoid colon with serosal nodularity concerning for obstruction from peritoneal carcinomatosis. 2. Nodular lesions which are partially calcified along the hepatic margin, pelvic sigmoid mesocolon pelvis as well as the ventral peritoneal space. 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 Partially calcified nodularity suggest potential ovarian source. The ventral midline peritoneal nodularity may most assessable for biopsy. 3. Mild entrapment of the distal RIGHT ureter associated with the mesenteric nodularity. 4. Indeterminate renal lesions. Recommend follow-up MRI renal protocol at some point. These results will be called to the ordering clinician or representative by the Radiologist Assistant, and communication documented in the PACS or zVision Dashboard. Electronically Signed   By: Suzy Bouchard M.D.   On: 08/22/2017 15:09   Dg Abd 2 Views  Result Date: 09/01/2017 CLINICAL DATA:  Bowel obstruction EXAM: ABDOMEN - 2 VIEW COMPARISON:  None. FINDINGS: Scattered gas containing small and large bowel loops without obstructive bowel gas pattern. Surgical tacks project over the mid abdomen presumably from ventral hernia repair. No free air, organomegaly nor suspicious radiopaque calculi. Lower lumbar facet arthropathy. IMPRESSION: Nonobstructed, nondistended bowel gas pattern. Electronically Signed   By: Ashley Royalty M.D.   On: 08/20/2017 19:55   Dg Abd Acute W/chest  Result Date: 08/26/2017 CLINICAL DATA:  Follow-up distal colonic obstruction. EXAM: DG ABDOMEN ACUTE W/ 1V CHEST COMPARISON:  August 25, 2017 FINDINGS: There is a small left effusion. The chest is otherwise stable. Lucency over the upper abdomen on the upright view may represent free  postoperative air or air within the skin folder incision. Free air would be expected after surgery yesterday. The colonic loops are significantly improved with placement of a colonic tube. No other change. IMPRESSION: 1. A colonic tube is been placed with decompression of the colon. 2. Probable postoperative free air, as expected. 3. No other change. Electronically Signed   By: Dorise Bullion III M.D   On: 08/26/2017 08:33   Dg Abd Acute W/chest  Result Date: 08/08/2017 CLINICAL DATA:  Preoperative examination. EXAM: DG ABDOMEN ACUTE W/ 1V CHEST COMPARISON:  08/24/2017; 08/27/2017; CT abdomen pelvis-08/22/2017 FINDINGS: Grossly unchanged cardiac silhouette and mediastinal contours with atherosclerotic plaque within the thoracic aorta. Loop recorder overlies the mid heart. Suspected slight reduction in persistent small left-sided effusion. Improved aeration of lung bases with persistent bibasilar opacities, left greater than right. No new focal airspace opacities. Pulmonary venous congestion without frank evidence of edema. No pneumothorax. No acute osseus abnormalities. Read demonstrated patulous distension of multiple loops of colon with index loop of descending colon within the left mid hemiabdomen measuring approximately 5.6 cm in diameter, similar to the 08/24/2017 examination. No pneumoperitoneum, pneumatosis or portal venous gas No definitive abnormal intra-abdominal calcifications. IMPRESSION: 1. Slight reduction in persistent small left-sided effusion and improved aeration of bilateral lung bases with persistent bibasilar opacities, atelectasis versus infiltrate. 2. Similar findings worrisome for distal colonic obstruction as demonstrated on recent abdominal CT. Electronically Signed   By: Sandi Mariscal M.D.   On: 08/06/2017 08:42   Dg Abd Acute W/chest  Result Date: 08/24/2017 CLINICAL DATA:  Lower abdominal pain. EXAM: DG ABDOMEN ACUTE W/ 1V CHEST COMPARISON:  Chest and abdominal x-rays from  yesterday. FINDINGS: Stable cardiomediastinal silhouette. Loop recorder again noted. Mild pulmonary vascular congestion, slightly more conspicuous on today's study.  Unchanged small left pleural effusion and left basilar atelectasis. No pneumothorax. Stable mild dilatation of the colon. No pneumoperitoneum. No acute osseous abnormality. IMPRESSION: 1. Stable mild colonic dilatation. 2. Unchanged small left pleural effusion and left basilar atelectasis. Electronically Signed   By: Titus Dubin M.D.   On: 08/24/2017 08:44   Dg Abd Acute W/chest  Result Date: 08/20/2017 CLINICAL DATA:  Left lower quadrant pain EXAM: DG ABDOMEN ACUTE W/ 1V CHEST COMPARISON:  08/22/2017 FINDINGS: Cardiac shadow is within normal limits. Loop recorder is noted. Left basilar atelectatic changes and small effusion are noted increased from the previous day. Right lung remains clear. No free air is noted. Mild dilatation of the colon is noted similar to that seen on the prior CT examination. Contrast is noted within the bladder. No bony abnormality is seen. IMPRESSION: Stable dilatation of the colon when compared with the prior exam. Increasing left basilar atelectasis and effusion. Electronically Signed   By: Inez Catalina M.D.   On: 08/27/2017 07:21   Dg Abd Portable 2v  Result Date: 08/29/2017 CLINICAL DATA:  Peritoneal carcinomatosis.  Colonic obstruction. EXAM: PORTABLE ABDOMEN - 2 VIEW COMPARISON:  Acute abdominal series 08/26/2017 and KUB 08/13/2017 FINDINGS: Examination demonstrates collection of air under the left hemidiaphragm on the upright film some which is intraluminal although it would be difficult to exclude free peritoneal air. Bowel gas pattern is otherwise nonobstructive. There are 2 catheters over the left lower quadrant/pelvis. Skin staples over the soft tissues of the lateral left pelvis/lower abdomen. Remainder of the exam is unchanged. IMPRESSION: Nonobstructive bowel gas pattern. Collection of air below the  left hemidiaphragm on the upright film likely intraluminal although cannot exclude a component of free peritoneal air. Recommend clinical correlation and consider left-side-down decubitus films to exclude free peritoneal air. Two catheters over the left lower quadrant/left pelvis. These results were called by telephone at the time of interpretation on 08/29/2017 at 5:28 pm to patient's nurse Council Mechanic, who verbally acknowledged these results and will notify patient's physician. Electronically Signed   By: Marin Olp M.D.   On: 08/29/2017 17:28   Dg C-arm 1-60 Min-no Report  Result Date: 08/16/2017 Fluoroscopy was utilized by the requesting physician.  No radiographic interpretation.   [place] Ekg Site Rite  Result Date: 08/27/2017 If [facility] image not attached, placement could not be confirmed due to current cardiac rhythm.   Assessment and plan-  Patient is a 77 y.o. female who is currently admitted due to carcinomatosis, frozen abdomen, bowel obstruction s/p Laparotomy, abdominal wound infection, respiratory failure after procedure, s/p extubation.   # Abdominal carcinomatosis, pathology result finalized, low grade serous carcinoma, supports GYN origin.  Continue to decline. Poor prognosis.  Granddaughter had questions about patient's cancer stage. I discussed with her that patient has not completed full image staging due to current status. She at least have Stage III, advanced disease, and needs chest image for further evaluation.  Given that she continues to decline from other complications, not a candidate for chemotherapy. I do not see any benefit of subjecting her to another CT contrast study for purpose of staging. Granddaughter agrees and will relay information to other family members. Met daughter at bedside. Above discussed again with her. She agrees with plan.   Genetic testing maybe useful in providing possible therapeutic options and valuable information for family members.  I  have discussed with genetic counselor ofri who had a phone conversation with patient's daughter who agrees with testing.  # Abdominal  wall cellulitis/Sepsis: continue antibiotics, supportive care.  # DNR  Thank you for allowing me to participate in the care of this patient.  Total face to face encounter time for this patient visit was 32mn. >50% of the time was  spent in counseling and coordination of care.   ZEarlie Server MD, PhD Hematology Oncology CPark[facility] Hospitalat ACamp[facility] Surgery Center LLCPager- [phone]/05/2017

## 2017-09-04 NOTE — Progress Notes (Signed)
[facility] NAME: Alicia Conley    MR#:  [id number]  DATE OF BIRTH:  09/16/1940  SUBJECTIVE:   Drowzy but responds to questions. No po intake. Only some jello with pills  REVIEW OF SYSTEMS:  Review of Systems  Constitutional: Negative for chills, fever and weight loss.  HENT: Negative for ear discharge, ear pain and nosebleeds.   Eyes: Negative for blurred vision, pain and discharge.  Respiratory: Negative for sputum production, shortness of breath, wheezing and stridor.   Cardiovascular: Negative for chest pain, palpitations, orthopnea and PND.  Gastrointestinal: Negative for abdominal pain, diarrhea, nausea and vomiting.  Genitourinary: Negative for frequency and urgency.  Musculoskeletal: Negative for back pain and joint pain.  Neurological: Positive for weakness. Negative for sensory change, speech change and focal weakness.  Psychiatric/Behavioral: Negative for depression and hallucinations. The patient is not nervous/anxious.    DRUG ALLERGIES:  No Known Allergies  VITALS:  Blood pressure (!) 152/57, pulse 64, temperature (!) 97.4 F (36.3 C), temperature source Oral, resp. rate (!) 24, height 5\' 5"  (1.651 m), weight (!) 137.4 kg (303 lb), SpO2 95 %.  PHYSICAL EXAMINATION:  GENERAL:  77 y.o.-year-old patient lying in the bed. obese EYES: Pupils equal, round, reactive to light and accommodation. No scleral icterus. Extraocular muscles intact.  HEENT: Head atraumatic, normocephalic. Oropharynx and nasopharynx clear.  NECK:  Supple, no jugular venous distention. No thyroid enlargement, no tenderness.  LUNGS: Bilateral wheezing and crackles CARDIOVASCULAR: S1, S2 normal. No murmurs, rubs, or gallops.  ABDOMEN: Soft.  Wound VAC in place EXTREMITIES: ++ pedal edema,no cyanosis, or clubbing.  NEUROLOGIC:  Gait not checked.  PSYCHIATRIC: The patient is drowzy.  Restless SKIN: No obvious rash, lesion, or ulcer.   Physical  Exam LABORATORY PANEL:   CBC Recent Labs  Lab 09/03/17 0533  WBC 22.1*  HGB 9.2*  HCT 28.2*  PLT 381   ------------------------------------------------------------------------------------------------------------------  Chemistries  Recent Labs  Lab 09/03/17 0533 09/04/17 0433  NA 142 141  K 3.7 3.7  CL 112* 112*  CO2 20* 19*  GLUCOSE 201* 174*  BUN 53* 68*  CREATININE 1.19* 1.50*  1.43*  CALCIUM 7.9* 7.7*  MG 2.1 2.1  AST 61*  --   ALT 27  --   ALKPHOS 157*  --   BILITOT 0.5  --    ------------------------------------------------------------------------------------------------------------------  Cardiac Enzymes No results for input(s): TROPONINI in the last 168 hours. ------------------------------------------------------------------------------------------------------------------  RADIOLOGY:  No results found.  ASSESSMENT AND PLAN:  77 year old female patient with history of hypertension, hyperlipidemia, hypothyroidism referred from GI clinic for direct admission for abdominal discomfort.  * Acute hypoxic resp failure Aspiration? Bronchitis? Pulm edema. Suggested CXR to family but they had refused On steroids, nebs, Abx Was on ventilator earlier due to aspiration episode  * Abdominal , Low grade serous carcinoma - heavy tumor burden -Status post attempted diverting colostomy on August 25, 2017 which was aborted given severe carcinomatosis-sample  taken and sent to lab -s/p colonic stent placement  by gastroenterology/Dr. Alice Reichert Discussed with Dr. Tasia Catchings Poor prognosis. Not a chemotherapy candidate at this time. I dont think she will get functionally strong enough for chemo.  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 Fluconazole added 09/03/2017  * Hypokalemia and hypomagnesemia Resolved  * Chronic benign essential hypertension Continue current regiment  *  ChronicHyperlipidemia Resume statinupon discharge  Prognosis poor  On TPN  All the records are reviewed and case discussed with Care Management/Social Worker Management plans discussed with the patient, family and they are in agreement.  CODE STATUS: DNR/DNI  TOTAL TIME TAKING CARE OF THIS PATIENT: 25 minutes.   Neita Carp M.D on 09/04/2017   Between 7am to 6pm - Pager - [phone]  After 6pm go to [email or web address] - password [email or web address] [facility]  Office  [phone]  CC: Primary care physician; [name], MD  Note: This dictation was prepared with Dragon dictation along with smaller phrase technology. Any transcriptional errors that result from this process are unintentional.

## 2017-09-04 NOTE — Consult Note (Signed)
Pharmacy Antibiotic Note  Alicia Conley is a 77 year old female s/p  Colonic decompressive tube now with possible abdominal wall cellulitis. Pharmacy has been consulted for vancomycin dosing. Unasyn changed to Zosyn on 7/2.  Plan: Vancomycin 1g once. Will give next dose in 6 hours for stacked dosing Vancomycin 1500mg  q 24 hr Goal trough 15-20 Trough prior to the 5th dose 7/2 @ 1730  7/2: Vanc trough resulted at 22 mcg/ml before 4th scheduled dose; trough level drawn 1-2 hours early. Of note dose on 6/30 is charted as paused. Unclear if pt actually received full dose or not on 6/30; based on trough result, likely given? Will decrease dose to vancomycin 1250 mg IV q24h and recheck level prior to 3rd new dose (as this level was elevated).   7/3: SCr trending up, will check level before dose tonight as level yesterday was elevated to ensure that pt is clearing appropriately.    Continue Zosyn 3.375 g IV q8h EI  Fluconazole 800 mg IV x1 then 400 mg IV daily. SCr increasing today, will adjust dose to fluconazole 200 mg IV daily based on current renal function.   Height: 5\' 5"  (165.1 cm) Weight: (!) 303 lb (137.4 kg) IBW/kg (Calculated) : 57  Temp (24hrs), Avg:97.6 F (36.4 C), Min:97.4 F (36.3 C), Max:97.7 F (36.5 C)  Recent Labs  Lab 08/29/17 0516 08/30/17 0605 08/31/17 0617 09/01/17 0649 09/02/17 0305 09/03/17 0533 09/03/17 1614 09/04/17 0433  WBC 17.4* 19.2*  --  21.0* 21.5* 22.1*  --   --   CREATININE 1.15* 1.20* 1.14* 1.02* 1.07* 1.19*  --  1.50*  1.43*  VANCOTROUGH  --   --   --   --   --   --  22*  --     Estimated Creatinine Clearance: 47.1 mL/min (A) (by C-G formula based on SCr of 1.43 mg/dL (H)).    No Known Allergies  Antimicrobials this admission: unasyn 6/28 >> 7/2 vancomycin 6/29 >>  Zosyn  7/2 >> Fluconazole 7/2>>  Dose adjustments this admission:   Microbiology results:  Thank you for allowing pharmacy to be a part of this patient's  care.  Rayna Sexton, PharmD, BCPS Clinical Pharmacist 09/04/2017 11:06 AM

## 2017-09-04 NOTE — Progress Notes (Signed)
Patient's dressing changed due to soiling, patient tolerated well. Wound continues to ooze bowel, green in color. Patient given PRN breathing treatment for SOB, SOB decreased post neb treatment.

## 2017-09-04 NOTE — Progress Notes (Signed)
Events noted. No signficant changes. Patient with continued stool drainage through fistulous tract and JP drainage tube.   Lethargic, responds to verbal and tactile stimuli HEENT: [place]/AT. PERRLA. Chest: CTA, few coarse rales CV: RRR no gallop Abd: distended, obese. BS hypoactve but positive Wound vac off. Drainage tube in LLQ.  Imp:   1. Abdominal wall cellulitis. 2. Peritoneal carcinomatosis. 3. S/P colonic stent - Looking for more significant output of stool through rectum. Fistulous tract located more proximally prevents this. 4. Sepsis 5. Poor overall prognosis 6. DNR status.   Rec:  1. Continue antibiotics, antifungals. 2. ?ID consult 3. Following along.

## 2017-09-04 NOTE — Progress Notes (Signed)
PT Cancellation Note  Patient Details Name: EDELYN HEIDEL MRN: [id number] DOB: 1940-12-29   Cancelled Treatment:    Reason Eval/Treat Not Completed: Medical issues which prohibited therapy. Pt having medical issues post surgery; nursing requests hold at this time. May possibly attempt later this p.m; check with nursing prior.    Larae Grooms, PTA 09/04/2017, 12:12 PM

## 2017-09-04 NOTE — Progress Notes (Addendum)
PHARMACY - ADULT TOTAL PARENTERAL NUTRITION CONSULT NOTE   Pharmacy Consult for TPN Indication: Inadequate oral intake related to poor appetite, nausea, vomiting   Patient Measurements: Height: 5\' 5"  (165.1 cm) Weight: (!) 303 lb (137.4 kg) IBW/kg (Calculated) : 57 TPN AdjBW (KG): 74.5 Body mass index is 50.42 kg/m.  Assessment:  77 yo female with distal bowel obstruction.   TPN Access: central line in place TPN start date: 08/27/17 Nutritional Goals (per RD recommendation on 08/27/17): KCal: 2014 Protein: 100g Fluid: 2292  This TPN provides 100 g of protein, 150 g of dextrose, and 60 g of lipids which provides 1776 kCals per day, meeting 90% of patient needs  Goal TPN rate is 83 ml/hr  Plan:  Continue Clinimix plain 5/15 TPN without lytes at 57mL/hr with MVI and trace elements. Restart 20% lipids at 25 mL/hr for 12 hours per dietician.  Electrolytes not in TPN: K 3.7, Mag 2.1 WNL; Phos elevated at 6.4 but stable  - monitor/reevaluate in am (discussed phosphorous level with hospitalist on rounds; continue to monitor at this time, phos level for AM ordered).   BG 133-162; SSI/24 hrs: 9 units - continue current SSI q4h order Clear liquid diet ordered and patient not eating much Monitor TPN labs in AM  Rocky Morel, PharmD 09/04/2017,9:51 AM

## 2017-09-04 NOTE — Consult Note (Signed)
[facility] Nurse wound follow up Attempt to successfully pouch the abdominal incision made with the assistance of the primary RN.  Our system provides large and small [place] pouches only, no medium.  The small is too small, and the large is too large.  I tried pouching with a large pouch, and it was immediately undermined with fecal effluent from the wound.  The pouch was removed, and dry kerlex, sufficient to fill the wound was placed and covered by ABD pad and taped.  Additionally, there is a large amount of fecal material draining out from around the colonic decompression tube.  The patient's daughter, Lovey Newcomer, was present throughout. Monitor the wound area(s) for worsening of condition such as: Signs/symptoms of infection,  Increase in size,  Development of or worsening of odor, Development of pain, or increased pain at the affected locations.  Notify the medical team if any of these develop.  Thank you for the consult.  Discussed plan of care with the patient and bedside nurse.  [facility] nurse will not follow at this time.  Please re-consult the [facility] team if needed.  Val Riles, RN, MSN, CWOCN, CNS-BC, pager [phone]

## 2017-09-04 NOTE — Progress Notes (Signed)
Daily Progress Note   Patient Name: Alicia Conley       Date: 09/04/2017 DOB: 04/14/1940  Age: 77 y.o. MRN#: [id number] Attending Physician: Hillary Bow, MD Primary Care Physician: [name], MD Admit Date: 08/15/2017  Reason for Consultation/Follow-up: Establishing goals of care  Subjective: Patient is lying in bed on her side. She complains of abdominal and back pain. RN at bedside administering pain medications. Patient rates pain 10/10. Family at bedside (husband, daughter, sister, and friend). We briefly discussed conversation on yesterday. Family concerned that her abdominal wound is now being packed versus using wound vac because of getting clogged. Advised that staff will perform dressing changes throughout the day and will make sure that she is receiving the best possible care. Patient appears uncomfortable and family reports she is not eating or drinking except with occasional sips of water and jello with her medications.  When asked patient about she states "I don't want to eat, I don't have an appetite. I am tired." Attempted to get her to elaborate on her statement of being tired, however she would not respond. Family appeared somewhat tearful. Daughter is upset and states she has just learned that a scan in 2015 at [facility] potentially identified cancer and they feel she would not be in this condition if they had known. She is requesting records for review. Support given to family at this time. Discussed her current condition and how patient continues to be uncomfortable. RN states they do feel the haldol is working some with allowing her to be calmer and less agitated.   Chart Reviewed and report received from Bedside RN.   Length of Stay: 14  Current Medications: Scheduled  Meds:  . acetaminophen  1,000 mg Oral Q6H  . aspirin EC  81 mg Oral Daily  . chlorhexidine gluconate (MEDLINE KIT)  15 mL Mouth Rinse BID  . enoxaparin (LOVENOX) injection  40 mg Subcutaneous Q12H  . feeding supplement  1 Container Oral TID BM  . insulin aspart  0-9 Units Subcutaneous Q4H  . ipratropium-albuterol  3 mL Nebulization Q6H  . levothyroxine  75 mcg Oral QAC breakfast  . methylPREDNISolone (SOLU-MEDROL) injection  60 mg Intravenous Q12H  . metoprolol tartrate  100 mg Oral BID  . sertraline  100 mg Oral Daily  . sodium chloride flush  10-40 mL Intracatheter Q12H  . traMADol  50 mg Oral Q6H    Continuous Infusions: . Fat emulsion    . fluconazole (DIFLUCAN) IV 200 mg (09/04/17 1330)  . piperacillin-tazobactam (ZOSYN)  IV 3.375 g (09/04/17 1320)  . TPN (CLINIMIX) Adult without lytes 83 mL/hr at 09/03/17 1803  . TPN (CLINIMIX) Adult without lytes    . vancomycin Stopped (09/04/17 0125)    PRN Meds: albuterol, alum & mag hydroxide-simeth, bisacodyl, fluticasone, haloperidol lactate **OR** haloperidol, hydrALAZINE, HYDROmorphone (DILAUDID) injection, ondansetron **OR** ondansetron (ZOFRAN) IV, sodium chloride flush  Physical Exam    Constitutional: Awake and alert.  Vital signs are normal. She appears well-developed. She has a sickly appearance.  Cardiovascular: Normal rate, regular rhythm, normal heart sounds, intact distal pulses and normal pulses.  Pulmonary/Chest: Effort normal and breath sounds wheeze + Abdominal: Normal appearance. Bowel sounds are decreased. Abdominal dressing s/p palliative colonic stent with wound vac.  Neurological: She is alert and responds to questions appropriately.  Skin: Right upper arm PICC line in place.  Psychiatric: Her speech is normal and behavior is normal. Judgment and thought content impaired.  Nursing note and vitals reviewed.  Vital Signs: BP (!) 144/76 (BP Location: Right Arm)   Pulse 64   Temp 97.7 F (36.5 C) (Axillary)    Resp 19   Ht '5\' 5"'$  (1.651 m)   Wt (!) 137.4 kg (303 lb)   SpO2 95%   BMI 50.42 kg/m  SpO2: SpO2: 95 % O2 Device: O2 Device: Nasal Cannula O2 Flow Rate: O2 Flow Rate (L/min): 3 L/min  Intake/output summary:   Intake/Output Summary (Last 24 hours) at 09/04/2017 1419 Last data filed at 09/04/2017 0636 Gross per 24 hour  Intake 3762 ml  Output 1730 ml  Net 2032 ml   LBM: Last BM Date: 09/04/17 Baseline Weight: Weight: 126.9 kg (279 lb 12.2 oz) Most recent weight: Weight: (!) 137.4 kg (303 lb)       Palliative Assessment/Data: PPS 20%   Flowsheet Rows     Most Recent Value  Intake Tab  Referral Department  Hospitalist  Unit at Time of Referral  Med/Surg Unit  Date Notified  08/27/17  Palliative Care Type  New Palliative care  Reason for referral  Clarify Goals of Care  Date of Admission  08/14/2017  Date first seen by Palliative Care  08/27/17  # of days Palliative referral response time  0 Day(s)  # of days IP prior to Palliative referral  6  Clinical Assessment  Psychosocial & Spiritual Assessment  Palliative Care Outcomes     Patient Active Problem List   Diagnosis Date Noted  . Goals of care, counseling/discussion   . Colonic obstruction ([place])   . Abdominal carcinomatosis ([place])   . Abdominal pain 08/10/2017  . Ileus ([place]) 08/17/2017    Palliative Care Assessment & Plan   Patient Profile: 77 y.o. female admitted on 08/03/2017 from home with abdominal pain for the last 2.5 weeks. She has a past medical history of hyperlipidemia, hypertension, and hypothyroidism. She had been worked up with [facility] and CT abdomen revealed a partial distal colonic obstruction with a large amount of stool. She was seen at our ER on Saturday and was discharged after evaluation. She continued to have discomfort and unable to eat or drink fluids, which led her to see Dr. Alice Reichert (Gastroenterologist). He directly admitted her. She reported her last bowel movement was more than a week prior  to admission. Since admission patient has received  fluid and electrolyte replacement. She is s/p flexible sigmoidoscopy on 08/18/2017 noted for internal hemorrhoids and diverticulosis. An attempt was made for a diverting colostomy on 08/18/2017 which was aborted given severe carcinomatosis; biopsy was taken. She has been seen by Dr. Mike Gip, Oncologist to evaluate plan of care. Palliative Medicine team consulted for goals of care discussion.   Recommendations/Plan:  DNR/DNI-at family's request  Continue to treat the treatable. Patient and family is seems more understanding after detailed conversation with Dr. Tasia Catchings, myself and Dr. Dahlia Byes. They are going to continue discussions amongst themselves regarding future plans.    Patient/family would like to allow time for watchful waiting to see if she will signs of recovery. They are more prepared after today's goals of care discussion, to shift to comfort measures and involve hospice care if she continues to decline and does not show signs of improvement over the next few days. Family continues to want to see if she progresses despite the signs of discomfort.   Haldol 0.5 mg po or IV every 8hr prn for agitation, anxiety, delirium. Per RN this seems to help when administered.   If patient develops uncontrolled nausea would recommend scheduled reglan, zofran and compazine for support.   Also would recommend the use of octreotide, decadron, and high dose PPI or H2 antagonist if needed.   Palliative Medicine team will continue to support patient, family, and medical team during hospitalization.   Goals of Care and Additional Recommendations:  Limitations on Scope of Treatment: Full Scope Treatment  Code Status:    Code Status Orders  (From admission, onward)        Start     Ordered   08/08/2017 1732  Full code  Continuous     08/04/2017 1731    Code Status History    This patient has a current code status but no historical code status.      Prognosis:   Unable to determine-guarded to poor in the setting of poor po intake, TPN initiation, decreased mobility, advanced peritoneal carcinomatosis, metastatic ovarian and neuroendocrine tumor, partial bowel obstruction, unsuccessful attempt for diverting colostomy, obstipation, hypertension, delirium,and hyperlipidemia.   Discharge Planning: To Be Determined - would recommend at minimum outpatient Palliative if patient continues with aggressive measures (chemo/xrt).   Care plan was discussed with patient, family, and bedside RN.   Thank you for allowing the Palliative Medicine Team to assist in the care of this patient.   Total Time:    35 min.  Prolonged Time Billed  Yes       Greater than 50%  of this time was spent counseling and coordinating care related to the above assessment and plan.  Alda Lea, NP-BC Palliative Medicine Team  Phone: [phone] Fax: [phone] Pager: [phone] Amion: Bjorn Pippin   Please contact Palliative Medicine Team phone at [phone] for questions and concerns.

## 2017-09-04 NOTE — Care Management Important Message (Signed)
Copy of signed IM left with patient and family in room. 

## 2017-09-04 NOTE — Progress Notes (Signed)
S/p decompressive colonic ube and stent for met carcinomatosis Cellulitis to abd wall New colocutaneous fistula Malnutrition  PE NAD, comfortable, not agitated Abd: soft, colonic tube in place w stool. There is also some stool coming from the open wound, vac removed by ostomy nurse and pouch will be place by her No peritonitis JP clear  A/P difficult situation n a pt that is critically ill with metastatic carcinomatosis Manage fistula w wound care and antibiotics and nutritional optimization No role for surgical intervention D/w family in detail Keep van, zosyn and diflucan for now

## 2017-09-05 DIAGNOSIS — T8149XA Infection following a procedure, other surgical site, initial encounter: Secondary | ICD-10-CM

## 2017-09-05 LAB — COMPREHENSIVE METABOLIC PANEL
ALBUMIN: 2 g/dL — AB (ref 3.5–5.0)
ALT: 52 U/L — ABNORMAL HIGH (ref 0–44)
ANION GAP: 11 (ref 5–15)
AST: 127 U/L — ABNORMAL HIGH (ref 15–41)
Alkaline Phosphatase: 200 U/L — ABNORMAL HIGH (ref 38–126)
BILIRUBIN TOTAL: 0.6 mg/dL (ref 0.3–1.2)
BUN: 95 mg/dL — ABNORMAL HIGH (ref 8–23)
CHLORIDE: 110 mmol/L (ref 98–111)
CO2: 17 mmol/L — ABNORMAL LOW (ref 22–32)
Calcium: 7.8 mg/dL — ABNORMAL LOW (ref 8.9–10.3)
Creatinine, Ser: 1.99 mg/dL — ABNORMAL HIGH (ref 0.44–1.00)
GFR calc Af Amer: 27 mL/min — ABNORMAL LOW (ref >60)
GFR, EST NON AFRICAN AMERICAN: 23 mL/min — AB (ref >60)
Glucose, Bld: 148 mg/dL — ABNORMAL HIGH (ref 70–99)
POTASSIUM: 3.9 mmol/L (ref 3.5–5.1)
Sodium: 138 mmol/L (ref 135–145)
TOTAL PROTEIN: 6 g/dL — AB (ref 6.5–8.1)

## 2017-09-05 LAB — GLUCOSE, CAPILLARY
GLUCOSE-CAPILLARY: 123 mg/dL — AB (ref 70–99)
GLUCOSE-CAPILLARY: 153 mg/dL — AB (ref 70–99)
GLUCOSE-CAPILLARY: 173 mg/dL — AB (ref 70–99)
GLUCOSE-CAPILLARY: 185 mg/dL — AB (ref 70–99)
Glucose-Capillary: 135 mg/dL — ABNORMAL HIGH (ref 70–99)
Glucose-Capillary: 138 mg/dL — ABNORMAL HIGH (ref 70–99)
Glucose-Capillary: 152 mg/dL — ABNORMAL HIGH (ref 70–99)

## 2017-09-05 LAB — VANCOMYCIN, TROUGH: Vancomycin Tr: 19 ug/mL (ref 15–20)

## 2017-09-05 LAB — PHOSPHORUS: Phosphorus: 7 mg/dL — ABNORMAL HIGH (ref 2.5–4.6)

## 2017-09-05 LAB — MAGNESIUM: Magnesium: 2.4 mg/dL (ref 1.7–2.4)

## 2017-09-05 MED ORDER — FAT EMULSION PLANT BASED 20 % IV EMUL
250.0000 mL | INTRAVENOUS | Status: AC
  Filled 2017-09-05 (×2): qty 250

## 2017-09-05 MED ORDER — HYDROMORPHONE HCL 1 MG/ML IJ SOLN
1.0000 mg | INTRAMUSCULAR | Status: DC | PRN
  Administered 2017-09-05 – 2017-09-07 (×14): 1 mg via INTRAVENOUS
  Filled 2017-09-05 (×15): qty 1

## 2017-09-05 MED ORDER — M.V.I. ADULT IV INJ
INJECTION | INTRAVENOUS | Status: AC
  Administered 2017-09-05: 18:00:00 via INTRAVENOUS
  Filled 2017-09-05: qty 2000

## 2017-09-05 MED ORDER — VANCOMYCIN HCL IN DEXTROSE 1-5 GM/200ML-% IV SOLN
1000.0000 mg | INTRAVENOUS | Status: DC
  Administered 2017-09-05 – 2017-09-06 (×2): 1000 mg via INTRAVENOUS
  Filled 2017-09-05 (×4): qty 200

## 2017-09-05 NOTE — Progress Notes (Signed)
Daily Progress Note   Patient Name: Alicia Conley       Date: 09/05/2017 DOB: 1940-09-25  Age: 77 y.o. MRN#: [id number] Attending Physician: Gorden Harms, MD Primary Care Physician: [name], MD Admit Date: 08/22/2017  Reason for Consultation/Follow-up: Establishing goals of care  Subjective: Patient is lying in bed on her side. She is sleeping off and on. States she just received pain medication, but prior to her pain was 12/10. Husband and sister at bedside. She continues to have frequent dressing changes due to drainage and stool content. Husband states "we are still taking it one day at a time, but looks like the days are getting shorter." Family states she continues to refuses to eat or drink. Only sips here and there and with medications. Support given. Brief discussion again regarding consideration for comfort measures which would focus on keeping her comfortable throughout this process and end of life. Husband closed eyes for a few minutes. Sister was nodding head in agreement, however husband opened eyes and stated "Nope we aren't there yet. Her daughter and I aren't there yet. We have to make sure." I asked for him to elaborate on what their timing was to continue watching and explain the signs they were looking for. He was unable to answer and stated "when she goes she will go no matter what and God will let us know." Support given. Offered chaplain support. More hard choices books were left for family.    Dr. Rosana Hoes spoke with family in great detail regarding prognosis and the fact that patient continues to suffer with complications and pain. Hospice was discussed and family seemed to be understanding. I had a follow-up discussion with family and spoke daughter and granddaughter  over the phone to include in discussion. Family confirms conversation with Dr. Rosana Hoes however, they have decided they would like to think about hospice have further discussions and will decide over the next day or say what it is they would like to do. Granddaughter states "I am wondering if we need to discuss with another facility or see if there is anything else that can be done to give her a fighting chance. We just aren't sure everything has been done for her!" I continued to explain that all options to allow Mrs. Hanke a fighting chance has been performed  and considered from surgery and oncology standpoint. She verbalized understanding and continued to state they are not prepared to make a decision regarding hospice care and/or comfort measures. Patient's sister and husband verbalized their acceptance and not wanting patient to suffer. Unfortunately, the family is allowing the granddaughter guide them with their decisions because she is 4 months away from graduating from nursing school and feels she is capable of making educated medical decisions. Explained that patient's comfort should be main focus as she deserves to be comfortable. Husband is HCPOA however again, he states he cannot make the decision because this is not his daughter and she is the only child of Mrs. Newton and he doesn't want to cause any problems. Family is planning to meet and discuss what it is they would like to do. Daughter states when they decide what path to take they will inform staff sometime tomorrow. Advised we will be available for full support.   Chart Reviewed and report received from Bedside RN.   Length of Stay: 15  Current Medications: Scheduled Meds:  . acetaminophen  1,000 mg Oral Q6H  . aspirin EC  81 mg Oral Daily  . chlorhexidine gluconate (MEDLINE KIT)  15 mL Mouth Rinse BID  . enoxaparin (LOVENOX) injection  40 mg Subcutaneous Q12H  . feeding supplement  1 Container Oral TID BM  . insulin aspart  0-9  Units Subcutaneous Q4H  . ipratropium-albuterol  3 mL Nebulization Q6H  . levothyroxine  75 mcg Oral QAC breakfast  . metoprolol tartrate  100 mg Oral BID  . sertraline  100 mg Oral Daily  . sodium chloride flush  10-40 mL Intracatheter Q12H  . traMADol  50 mg Oral Q6H    Continuous Infusions: . fluconazole (DIFLUCAN) IV Stopped (09/05/17 1107)  . piperacillin-tazobactam (ZOSYN)  IV Stopped (09/05/17 0909)  . TPN (CLINIMIX) Adult without lytes 83 mL/hr at 09/04/17 1755  . vancomycin Stopped (09/05/17 0442)    PRN Meds: albuterol, alum & mag hydroxide-simeth, bisacodyl, fluticasone, haloperidol lactate **OR** haloperidol, hydrALAZINE, HYDROmorphone (DILAUDID) injection, ondansetron **OR** ondansetron (ZOFRAN) IV, sodium chloride flush  Physical Exam    Constitutional: Awake and alert.  Vital signs are normal. She appears well-developed. She has a sickly appearance.  Cardiovascular: Normal rate, regular rhythm, normal heart sounds, intact distal pulses and normal pulses.  Pulmonary/Chest: Effort normal and breath sounds wheeze + 3L/[place]  Abdominal: Normal appearance. Bowel sounds are decreased. Abdominal dressing s/p palliative colonic stent. Redness to abdomen.  Neurological: She is alert.  Skin: Right upper arm PICC line in place.  Psychiatric: Her speech is normal and behavior is normal. Judgment and thought content impaired.  Nursing note and vitals reviewed.  Vital Signs: BP 132/65 (BP Location: Left Arm)   Pulse 66   Temp 98 F (36.7 C) (Oral)   Resp (!) 22   Ht 5' 5"  (1.651 m)   Wt (!) 140.2 kg (309 lb)   SpO2 99%   BMI 51.42 kg/m  SpO2: SpO2: 99 % O2 Device: O2 Device: Nasal Cannula O2 Flow Rate: O2 Flow Rate (L/min): 3 L/min  Intake/output summary:   Intake/Output Summary (Last 24 hours) at 09/05/2017 1112 Last data filed at 09/05/2017 1033 Gross per 24 hour  Intake 567.92 ml  Output 175 ml  Net 392.92 ml   LBM: Last BM Date: 09/04/17 Baseline Weight: Weight:  126.9 kg (279 lb 12.2 oz) Most recent weight: Weight: (!) 140.2 kg (309 lb)       Palliative Assessment/Data: PPS  20%   Flowsheet Rows     Most Recent Value  Intake Tab  Referral Department  Hospitalist  Unit at Time of Referral  Med/Surg Unit  Date Notified  08/27/17  Palliative Care Type  New Palliative care  Reason for referral  Clarify Goals of Care  Date of Admission  08/30/2017  Date first seen by Palliative Care  08/27/17  # of days Palliative referral response time  0 Day(s)  # of days IP prior to Palliative referral  6  Clinical Assessment  Psychosocial & Spiritual Assessment  Palliative Care Outcomes     Patient Active Problem List   Diagnosis Date Noted  . Wound cellulitis after surgery   . Goals of care, counseling/discussion   . Large bowel obstruction ([place])   . Abdominal carcinomatosis ([place])   . Abdominal pain 08/08/2017  . Ileus ([place]) 08/20/2017    Palliative Care Assessment & Plan   Patient Profile: 77 y.o. female admitted on 08/10/2017 from home with abdominal pain for the last 2.5 weeks. She has a past medical history of hyperlipidemia, hypertension, and hypothyroidism. She had been worked up with [facility] and CT abdomen revealed a partial distal colonic obstruction with a large amount of stool. She was seen at our ER on Saturday and was discharged after evaluation. She continued to have discomfort and unable to eat or drink fluids, which led her to see Dr. Alice Reichert (Gastroenterologist). He directly admitted her. She reported her last bowel movement was more than a week prior to admission. Since admission patient has received fluid and electrolyte replacement. She is s/p flexible sigmoidoscopy on 08/22/2017 noted for internal hemorrhoids and diverticulosis. An attempt was made for a diverting colostomy on 08/04/2017 which was aborted given severe carcinomatosis; biopsy was taken. She has been seen by Dr. Mike Gip, Oncologist to evaluate plan of care. Palliative  Medicine team consulted for goals of care discussion.   Recommendations/Plan:  DNR/DNI-at family's request  Continue to treat the treatable. Patient and family is seems more understanding after detailed conversation with Dr. Tasia Catchings, myself and Dr. Dahlia Byes. They are going to continue discussions amongst themselves regarding future plans.    Patient/family continues to struggle with allowing patient to be comfort measures and have hospice services, despite multiple conversations with surgery and oncology. They are planning to meet as a family and discuss if they are ready to shift to comfort measures on Friday. They are aware prognosis is poor and there are no further medical interventions that can be performed to improve her health conditions. Patient continues to suffer from pain and discomfort. She is leaking stool and nursing staff are changing her abdominal dressings frequently.    Palliative Medicine team will continue to support patient, family, and medical team during hospitalization.   Goals of Care and Additional Recommendations:  Limitations on Scope of Treatment: Full Scope Treatment  Code Status:    Code Status Orders  (From admission, onward)        Start     Ordered   08/10/2017 1732  Full code  Continuous     08/12/2017 1731    Code Status History    This patient has a current code status but no historical code status.     Prognosis:   Unable to determine-guarded to poor in the setting of poor po intake, TPN initiation, decreased mobility, advanced peritoneal carcinomatosis, metastatic ovarian and neuroendocrine tumor, partial bowel obstruction, unsuccessful attempt for diverting colostomy, obstipation, hypertension, delirium,and hyperlipidemia.   Discharge Planning:  To Be Determined - would recommend full comfort measures and hospice home.   Care plan was discussed with patient, family, Dr. Rosana Hoes, and bedside RN.   Thank you for allowing the Palliative Medicine Team to  assist in the care of this patient.   Total Time:    60 min.  Prolonged Time Billed  Yes       Greater than 50%  of this time was spent counseling and coordinating care related to the above assessment and plan.  Alda Lea, NP-BC Palliative Medicine Team  Phone: [phone] Fax: [phone] Pager: [phone] Amion: Bjorn Pippin   Please contact Palliative Medicine Team phone at [phone] for questions and concerns.

## 2017-09-05 NOTE — Progress Notes (Signed)
Pt c/o pain. Pt's husband states pt is in pain. Pt appears restless and c/o 12/10 pain. Discussed with Dr Jerelyn Charles while MD on floor. VO given for pain medication. See orders. Educated family on new pain medication availability. Pt's family verbalizes understanding. NAD>

## 2017-09-05 NOTE — Plan of Care (Signed)
Palliative consult. Palliative RN at bedside to discuss case with family. Family not coping well with end of life decisions.

## 2017-09-05 NOTE — Progress Notes (Signed)
Found pt to be very lethargic without response. Pt RR 10-12/min Spo2 100% on NRB. Family at bedside. Consulted with pt RN about pt condition. Suggested ABG or Bipap for pt. RN calling MD for further orders.

## 2017-09-05 NOTE — Progress Notes (Addendum)
PHARMACY - ADULT TOTAL PARENTERAL NUTRITION CONSULT NOTE   Pharmacy Consult for TPN Indication: Inadequate oral intake related to poor appetite, nausea, vomiting   Patient Measurements: Height: 5\' 5"  (165.1 cm) Weight: (!) 309 lb (140.2 kg) IBW/kg (Calculated) : 57 TPN AdjBW (KG): 74.5 Body mass index is 51.42 kg/m.  Assessment:  77 yo female with distal bowel obstruction.   TPN Access: central line in place TPN start date: 08/27/17 Nutritional Goals (per RD recommendation on 08/27/17): KCal: 2014 Protein: 100g Fluid: 2292  This TPN provides 100 g of protein, 150 g of dextrose, and 60 g of lipids which provides 1776 kCals per day, meeting 90% of patient needs  Goal TPN rate is 83 ml/hr  Plan:  Continue Clinimix plain 5/15 TPN without lytes at 43mL/hr with MVI and trace elements. Restart 20% lipids at 25 mL/hr for 12 hours per dietician.  Electrolytes not in TPN: K 3.7, Mag 2.1 WNL; Phos elevated at 6.4 but stable  - monitor/reevaluate in am (discussed phosphorous level with hospitalist on rounds; continue to monitor at this time, phos level for AM ordered).   BG 133-162; SSI/24 hrs: 9 units - continue current SSI q4h order Clear liquid diet ordered and patient not eating much Monitor TPN labs in AM  09/05/17 08:36 K and Mg do not need supplement, phos continues to rise and is 7 this AM. Fats stopped yesterday since each 100 mL intralipid contains 1.5 mmol phos. Spoke with RN Kathlee Nations, patient is edematous at this time. No electrolyte replacement given this morning. Also spoke with RD, holding lipids did not affect phos - likely more related to renal function that supplement. Resume lipid today and continue TPN at goal. Labs Mondays/thursdays now, except CMP ordered daily.   Laural Benes, Pharm.D., BCPS Clinical Pharmacist 09/05/2017,8:36 AM

## 2017-09-05 NOTE — Clinical Social Work Note (Signed)
CSW continues to follow however, patient is currently not able to participate in short term rehab and family is looking at end of life decisions but are having a difficult time coming to decisions regarding aggressive care or making patient comfortable. Shela Leff MSW,LCSW [phone]

## 2017-09-05 NOTE — Progress Notes (Signed)
[facility] [place] NAME: Alicia Conley    MR#:  [id number]  DATE OF BIRTH:  1941/01/05  SUBJECTIVE:  CHIEF COMPLAINT:  No chief complaint on file. Patient complains of severe pain only, case discussed with nursing staff, all questions answered, husband at the bedside  REVIEW OF SYSTEMS:  CONSTITUTIONAL: No fever, fatigue or weakness.  EYES: No blurred or double vision.  EARS, NOSE, AND THROAT: No tinnitus or ear pain.  RESPIRATORY: No cough, shortness of breath, wheezing or hemoptysis.  CARDIOVASCULAR: No chest pain, orthopnea, edema.  GASTROINTESTINAL: No nausea, vomiting, diarrhea or abdominal pain.  GENITOURINARY: No dysuria, hematuria.  ENDOCRINE: No polyuria, nocturia,  HEMATOLOGY: No anemia, easy bruising or bleeding SKIN: No rash or lesion. MUSCULOSKELETAL: No joint pain or arthritis.   NEUROLOGIC: No tingling, numbness, weakness.  PSYCHIATRY: No anxiety or depression.   ROS  DRUG ALLERGIES:  No Known Allergies  VITALS:  Blood pressure 132/65, pulse 66, temperature 98 F (36.7 C), temperature source Oral, resp. rate (!) 22, height 5\' 5"  (1.651 m), weight (!) 140.2 kg (309 lb), SpO2 99 %.  PHYSICAL EXAMINATION:  GENERAL:  77 y.o.-year-old patient lying in the bed with no acute distress.  EYES: Pupils equal, round, reactive to light and accommodation. No scleral icterus. Extraocular muscles intact.  HEENT: Head atraumatic, normocephalic. Oropharynx and nasopharynx clear.  NECK:  Supple, no jugular venous distention. No thyroid enlargement, no tenderness.  LUNGS: Normal breath sounds bilaterally, no wheezing, rales,rhonchi or crepitation. No use of accessory muscles of respiration.  CARDIOVASCULAR: S1, S2 normal. No murmurs, rubs, or gallops.  ABDOMEN: Soft, nontender, nondistended. Bowel sounds present. No organomegaly or mass.  EXTREMITIES: No pedal edema, cyanosis, or clubbing.  NEUROLOGIC: Cranial nerves II through XII  are intact. Muscle strength 5/5 in all extremities. Sensation intact. Gait not checked.  PSYCHIATRIC: The patient is alert and oriented x 3.  SKIN: No obvious rash, lesion, or ulcer.   Physical Exam LABORATORY PANEL:   CBC Recent Labs  Lab 09/03/17 0533  WBC 22.1*  HGB 9.2*  HCT 28.2*  PLT 381   ------------------------------------------------------------------------------------------------------------------  Chemistries  Recent Labs  Lab 09/05/17 0614  NA 138  K 3.9  CL 110  CO2 17*  GLUCOSE 148*  BUN 95*  CREATININE 1.99*  CALCIUM 7.8*  MG 2.4  AST 127*  ALT 52*  ALKPHOS 200*  BILITOT 0.6   ------------------------------------------------------------------------------------------------------------------  Cardiac Enzymes No results for input(s): TROPONINI in the last 168 hours. ------------------------------------------------------------------------------------------------------------------  RADIOLOGY:  No results found.  ASSESSMENT AND PLAN:  77 year old female patient with history of hypertension, hyperlipidemia, hypothyroidism referred from GI clinic for direct admission for abdominal discomfort.  * Acute hypoxic resp failure Aspiration? Bronchitis? Pulm edema. 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 Alice Reichert Discussed with Dr. 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 Fluconazole added 09/03/2017 General surgery input appreciated-await further  recommendations  * Hypokalemia and hypomagnesemia Resolved  * Chronic benign essential hypertension Continue current regiment  * ChronicHyperlipidemia Resume statinupon discharge  Prognosispoor  On TPN  All the records are reviewed and case discussed with Care Management/Social Workerr. Management plans discussed with the patient, family and they are in agreement.  CODE STATUS: dnr  TOTAL TIME TAKING CARE OF THIS PATIENT: 35 minutes.     POSSIBLE D/C IN 5 DAYS, DEPENDING ON CLINICAL CONDITION.   Alicia Conley M.D on 09/05/2017   Between 7am to 6pm - Pager - [phone]  After 6pm go to [email or web address] - password [email or web address] [facility]  Office  [phone]  CC: Primary care physician; [name], MD  Note: This dictation was prepared with Dragon dictation along with smaller phrase technology. Any transcriptional errors that result from this process are unintentional.

## 2017-09-05 NOTE — Consult Note (Signed)
Pharmacy Antibiotic Note  Alicia Conley is a 77 year old female s/p  Colonic decompressive tube now with possible abdominal wall cellulitis. Pharmacy has been consulted for vancomycin dosing. Unasyn changed to Zosyn on 7/2.  Plan: Vancomycin 1g once. Will give next dose in 6 hours for stacked dosing Vancomycin 1500mg  q 24 hr Goal trough 15-20 Trough prior to the 5th dose 7/2 @ 1730  7/2: Vanc trough resulted at 22 mcg/ml before 4th scheduled dose; trough level drawn 1-2 hours early. Of note dose on 6/30 is charted as paused. Unclear if pt actually received full dose or not on 6/30; based on trough result, likely given? Will decrease dose to vancomycin 1250 mg IV q24h and recheck level prior to 3rd new dose (as this level was elevated).   7/3: SCr trending up, will check level before dose tonight as level yesterday was elevated to ensure that pt is clearing appropriately.   07/04 @ 0200 VT 19 mcg/mL drawn appropriately; however, considering Scr has been rising and have not checked a BMP in two days, will decrease dose to vanc 1g IV q24h, will check a trough 07/06 @ 0100 and will monitor Scr trend w/ am labs.   Continue Zosyn 3.375 g IV q8h EI  Fluconazole 800 mg IV x1 then 400 mg IV daily. SCr increasing today, will adjust dose to fluconazole 200 mg IV daily based on current renal function.   Height: 5\' 5"  (165.1 cm) Weight: (!) 303 lb (137.4 kg) IBW/kg (Calculated) : 57  Temp (24hrs), Avg:97.8 F (36.6 C), Min:97.4 F (36.3 C), Max:98.3 F (36.8 C)  Recent Labs  Lab 08/29/17 0516 08/30/17 0605 08/31/17 0617 09/01/17 0649 09/02/17 0305 09/03/17 0533 09/03/17 1614 09/04/17 0433 09/04/17 2253  WBC 17.4* 19.2*  --  21.0* 21.5* 22.1*  --   --   --   CREATININE 1.15* 1.20* 1.14* 1.02* 1.07* 1.19*  --  1.50*  1.43*  --   VANCOTROUGH  --   --   --   --   --   --  22*  --  19    Estimated Creatinine Clearance: 47.1 mL/min (A) (by C-G formula based on SCr of 1.43 mg/dL (H)).     No Known Allergies  Antimicrobials this admission: unasyn 6/28 >> 7/2 vancomycin 6/29 >>  Zosyn  7/2 >> Fluconazole 7/2>>  Dose adjustments this admission:   Microbiology results:  Thank you for allowing pharmacy to be a part of this patient's care.  Alicia Conley, PharmD, BCPS Clinical Pharmacist 09/05/2017

## 2017-09-05 NOTE — Progress Notes (Signed)
[facility] Hospital Day(s): 15.   Post op day(s): 8 Days Post-Op.   Interval History: Patient seen and examined with alternating alertness, pain and discomfort particularly with dressing changes, partially controlled with medications. Patient continues to drain feces from her surgical wound, drain, and rectum, denies fever/chills, CP, or SOB. Patient's family continues to have many questions, though most express understanding and wish for patient to be comfortable.  Review of Systems:  Constitutional: denies fever, chills  Respiratory: denies any shortness of breath  Cardiovascular: denies chest pain or palpitations  Gastrointestinal: abdominal pain and bowel function as per interval history Musculoskeletal: denies pain, decreased motor or sensation Integumentary: denies any other rashes or skin discolorations except post-surgical abdominal wounds  Vital signs in last 24 hours: [min-max] current  Temp:  [97.7 F (36.5 C)-98.3 F (36.8 C)] 98 F (36.7 C) (07/04 0449) Pulse Rate:  [56-71] 66 (07/04 0449) Resp:  [19-22] 22 (07/03 2317) BP: (121-144)/(59-76) 132/65 (07/04 0449) SpO2:  [95 %-99 %] 99 % (07/04 0747) Weight:  [309 lb (140.2 kg)] 309 lb (140.2 kg) (07/04 0500)     Height: 5\' 5"  (165.1 cm) Weight: (!) 309 lb (140.2 kg) BMI (Calculated): 51.42   Intake/Output this shift:  No intake/output data recorded.   Intake/Output last 2 shifts:  @IOLAST2SHIFTS @   Physical Exam:  Constitutional: alert, cooperative and no distress  Respiratory: breathing non-labored at rest  Cardiovascular: regular rate and sinus rhythm  Gastrointestinal: soft, obese, and non-distended with moderate peri-incisional tenderness to palpation, feces draining from surgical incision and through/around colonic decompression catheter, clear yellow fluid in drainage bulb suction  Labs:  CBC Latest Ref Rng & Units 09/03/2017 09/02/2017 09/01/2017  WBC 3.6 - 11.0 K/uL 22.1(H) 21.5(H) 21.0(H)   Hemoglobin 12.0 - 16.0 g/dL 9.2(L) 9.9(L) 9.7(L)  Hematocrit 35.0 - 47.0 % 28.2(L) 30.6(L) 30.3(L)  Platelets 150 - 440 K/uL 381 420 401   CMP Latest Ref Rng & Units 09/05/2017 09/04/2017 09/04/2017  Glucose 70 - 99 mg/dL 148(H) 174(H) -  BUN 8 - 23 mg/dL 95(H) 68(H) -  Creatinine 0.44 - 1.00 mg/dL 1.99(H) 1.50(H) 1.43(H)  Sodium 135 - 145 mmol/L 138 141 -  Potassium 3.5 - 5.1 mmol/L 3.9 3.7 -  Chloride 98 - 111 mmol/L 110 112(H) -  CO2 22 - 32 mmol/L 17(L) 19(L) -  Calcium 8.9 - 10.3 mg/dL 7.8(L) 7.7(L) -  Total Protein 6.5 - 8.1 g/dL 6.0(L) - -  Total Bilirubin 0.3 - 1.2 mg/dL 0.6 - -  Alkaline Phos 38 - 126 U/L 200(H) - -  AST 15 - 41 U/L 127(H) - -  ALT 0 - 44 U/L 52(H) - -   Imaging studies: No new pertinent imaging studies  Assessment/Plan: 77 y.o.femalewith distal large bowel obstruction attributed to external compression by extensive diffuse peritoneal carcinomatosis of likely ovarian etiology s/p laparotomy with unsuccessful attempted colostomy instead with decompressing colon-drainage tube ([facility], 08/22/2017) and s/p decompressing colonic stents x2 [facility], 11/01/5619), complicated bypre-stent aspiration pneumonitis, icnreasing leukocytosis,and comorbidities includingmorbid obesity (BMI 47), HTN, hypercholesterolemia, thyroid disease, and chronic back pain.  - pain control prn             - antibiotics + nutritional support - attempted to answer as many of patient's and her family's questions as possible - considering persistent and increasingly uncorrectable challenges, palliative care consultation appreciated - medical management of comorbidities as per primary team - no further surgical intervention - DVT prophylaxis  All of the above findings and recommendations  were discussed with the patient, patient's family, and patient's RN, and all of patient's and family's questions were answered to their  expressed satisfaction.   Thank you for the opportunity to participate in this patient's care.  -- Marilynne Drivers Rosana Hoes, MD, [place]: [facility] - Partnering for exceptional care. Office: [phone]

## 2017-09-05 NOTE — Progress Notes (Addendum)
Nursing called to report worsening clinical condition and breathing requiring 100% nonrebreather mask.   Reading all the medical records and palliative care discussion with family it seems like some ethical situation and family dynamics.  Talking with nursing to confirm what I saw/read and all the medical records in this patient it seems like we do not have a clear path on hospice yet and family would like to do everything at this point.  They are contemplating meeting with palliative care again may be tomorrow or day after.  Until then I will go ahead and transfer this patient to ICU/stepdown for BiPAP.  Discussed with nursing & eICU attending.  Consult intensivist.

## 2017-09-05 NOTE — Progress Notes (Signed)
Patient events noted. Still on palliative care. No significant improvement overall. Still having pain.  VSS Low grade temp  Peritoneal carcinomatosis with serous carcinoma, heavy tumor burden with colonic obstruction s/p laparotomy with decompression tube, s/p colonic stent placement  Still poor overall prognosis. No new recommendations.

## 2017-09-05 NOTE — Consult Note (Signed)
PULMONARY / CRITICAL CARE MEDICINE   Name: Alicia Conley MRN: [id number] DOB: 06-20-1940    ADMISSION DATE:  08/09/2017   CONSULTATION DATE:  09/05/2017  REFERRING MD: Dr. Manuella Ghazi   Reason: Acute hypoxic respiratory failure  HISTORY OF PRESENT ILLNESS:    The patient is a 77 year old female presented to the hospital on 08/16/2017 with approximately 2 weeks of a left lower quadrant pain, with diarrhea.  CT showed partial distal colonic obstruction, the patient was able to pass flatus, and was discharged.  Her symptoms progressed, she saw gastroenterologist outpatient where she was directly admitted to the hospital on 6/19.  She was maintained n.p.o., repeat CT abdomen pelvis on 6/20 was suggestive of peritoneal carcinomatosis.  She underwent sigmoidoscopy on 6/21 which showed stool in the sigmoid but was otherwise unrevealing. On 6/23 the patient went for a diverting colostomy however the patient's abdomen was frozen due to massive and multiple peritoneal implants, therefore she went underwent laparoscopic biopsies and was closed.  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 On 6/26 patient underwent a repeat sigmoidoscopy which showed a stricture in the mid-sigmoid colon, and had a stent placed.  Postoperatively her course was complicated by acute respiratory failure with presumed aspiration pneumonitis, patient was transferred to the ICU.  Patient was extubated the next day on 6/27, transferred to the medical floor on 6/29.  The patient has been started on TPN. Yesterday (6/30) the patient had a bout of vomiting, it was recommended that the patient get a abdominal x-ray, however patient's family asked to defer to gastroenterology who approved abdominal x-ray. On evaluation today by hospitalist service it was noted patient had some wheezing, chest x-ray was recommended, however family declined. Images personally reviewed, lower cuts of the lung seen on most recent CT abdomen pelvis,  showed no evidence of infiltrate.  Earlier this morning the patient's blood pressure was very elevated at 215/205, now down to 181/70.  Patient has been maintained on nasal cannula at 3 L.  Review of laboratory values is unremarkable other than mild hyperchloremia with chronic kidney disease, hyperphosphatemia, leukocytosis. Currently on vancomycin and zosyn  This evening, patient became acutely hpoxic and was placed on a nonrebreather.  Her SPO2 did not improve hence she was transferred to the ICU for BiPAP. Patient is supposed to be made hospice care but family is still contemplating and discussing with palliative care.  PAST MEDICAL HISTORY :  She  has a past medical history of Cancer ([place]), High cholesterol, Hypertension, and Thyroid disease.  PAST SURGICAL HISTORY: She  has a past surgical history that includes Oophorectomy (Bilateral); Cardiac catheterization (N/A, 04/03/2016); Breast cyst aspiration (Left); Breast biopsy (Left, 11/07/12); Hernia repair; Flexible sigmoidoscopy (N/A, 08/27/2017); laparotomy (N/A, 08/29/2017); Flexible sigmoidoscopy (N/A, 08/24/2017); and colonic stent placement (N/A, 08/08/2017).  No Known Allergies  No current facility-administered medications on file prior to encounter.    Current Outpatient Medications on File Prior to Encounter  Medication Sig  . Aspirin (ASPIR-81 PO) Take 1 tablet by mouth 2 (two) times daily.  Marland Kitchen atorvastatin (LIPITOR) 80 MG tablet Take 80 mg by mouth daily.  . Biotin 1 MG CAPS Take 1 mg by mouth 3 (three) times daily.  . cholecalciferol (VITAMIN D) 1000 units tablet Take 1,000 Units by mouth daily.  . Coenzyme Q10 (CO Q 10) 100 MG CAPS Take 1 capsule by mouth daily.  . cyclobenzaprine (FLEXERIL) 5 MG tablet Take 5 mg by mouth at bedtime as needed for muscle spasms.  Marland Kitchen  enalapril (VASOTEC) 20 MG tablet Take 20 mg by mouth 2 (two) times daily.  . fluticasone (FLONASE) 50 MCG/ACT nasal spray Place 1 spray into both nostrils daily as needed  for allergies or rhinitis.  . hydrochlorothiazide (HYDRODIURIL) 25 MG tablet Take 25 mg by mouth daily.  Marland Kitchen levothyroxine (SYNTHROID, LEVOTHROID) 75 MCG tablet Take 75 mcg by mouth daily before breakfast.  . metoprolol (LOPRESSOR) 100 MG tablet Take 100 mg by mouth 2 (two) times daily.  Marland Kitchen omeprazole (PRILOSEC) 20 MG capsule Take 20 mg by mouth daily.   . sertraline (ZOLOFT) 100 MG tablet Take 100 mg by mouth daily.  . vitamin B-12 (CYANOCOBALAMIN) 1000 MCG tablet Take 1,000 mcg by mouth daily.    FAMILY HISTORY:  Her indicated that the status of her brother is unknown. She indicated that her daughter is alive. She indicated that her paternal aunt is alive. She indicated that both of her cousins are alive.   SOCIAL HISTORY: She  reports that she has never smoked. She has never used smokeless tobacco. She reports that she does not drink alcohol or use drugs.  REVIEW OF SYSTEMS:   Unable to obtain as patient is on continuous BiPAP and very somnolent  SUBJECTIVE:   VITAL SIGNS: BP (!) 154/64   Pulse 80   Temp (!) 96.2 F (35.7 C) (Axillary)   Resp 20   Ht 5\' 5"  (1.651 m)   Wt (!) 309 lb (140.2 kg)   SpO2 96%   BMI 51.42 kg/m   HEMODYNAMICS:    VENTILATOR SETTINGS: FiO2 (%):  [50 %] 50 %  INTAKE / OUTPUT: I/O last 3 completed shifts: In: 577.9 [I.V.:311.3; IV Piggyback:266.7] Out: 175 [Urine:150; Drains:25]  PHYSICAL EXAMINATION: General: No acute distress Neuro: Somnolent, unresponsive to voice and touch, withdraws to noxious stimulus HEENT: PERRLA, trachea midline, no JVD Cardiovascular: Pulse regular, S1-S2, no murmur regurg or gallop, +2 pulses bilaterally Lungs: Bilateral breath sounds, diminished in the bases, no wheezing Abdomen: Distended, severe erythema and inflammation in the lower abdomen, dressing intact, tubes in place and draining normally Musculoskeletal: No joint deformities Skin: Warm and dry, 2 small excoriations in upper back, dressing  intact  LABS:  BMET Recent Labs  Lab 09/03/17 0533 09/04/17 0433 09/05/17 0614  NA 142 141 138  K 3.7 3.7 3.9  CL 112* 112* 110  CO2 20* 19* 17*  BUN 53* 68* 95*  CREATININE 1.19* 1.50*  1.43* 1.99*  GLUCOSE 201* 174* 148*    Electrolytes Recent Labs  Lab 09/03/17 0533 09/04/17 0433 09/05/17 0614  CALCIUM 7.9* 7.7* 7.8*  MG 2.1 2.1 2.4  PHOS 6.4* 6.4* 7.0*    CBC Recent Labs  Lab 09/01/17 0649 09/02/17 0305 09/03/17 0533  WBC 21.0* 21.5* 22.1*  HGB 9.7* 9.9* 9.2*  HCT 30.3* 30.6* 28.2*  PLT 401 420 381    Coag's No results for input(s): APTT, INR in the last 168 hours.  Sepsis Markers No results for input(s): LATICACIDVEN, PROCALCITON, O2SATVEN in the last 168 hours.  ABG No results for input(s): PHART, PCO2ART, PO2ART in the last 168 hours.  Liver Enzymes Recent Labs  Lab 09/02/17 0305 09/03/17 0533 09/05/17 0614  AST 45* 61* 127*  ALT 20 27 52*  ALKPHOS 135* 157* 200*  BILITOT 0.6 0.5 0.6  ALBUMIN 1.9* 1.8* 2.0*    Cardiac Enzymes No results for input(s): TROPONINI, PROBNP in the last 168 hours.  Glucose Recent Labs  Lab 09/05/17 0004 09/05/17 0446 09/05/17 0753 09/05/17 1140 09/05/17  1641 09/05/17 2002  GLUCAP 173* 185* 123* 135* 138* 152*    Imaging No results found.  SIGNIFICANT EVENTS: 07/4: Readmitted to the ICU  Antimicrobials Vancomycin Zosyn Fluconazole  LINES/TUBES: Peripheral IVs and right upper arm PICC line  DISCUSSION: 78 year old female with peritoneal carcinomatosis with large bowel obstruction status post colonic decompression with a complicated hospital course; now presenting with acute hypoxic respiratory failure requiring BiPAP  ASSESSMENT / PLAN: Hypoxic respiratory failure, acute on chronic. -Continues BiPAP and titrate to nasal cannula as tolerated - Continue duo nebs. - Continue steroids, wean as tolerated. - Continue vancomycin, Zosyn, fluconazole  Peritoneal carcinomatosis with large  bowel obstruction.  Patient is now status post colonic decompressive tube along with stenting of the colon.per surgery patient will be started on a clear liquid diet.pending results of pathology and oncology consultation -Follow-up with palliative care  Abdominal wall cellulitis Continue antibiotics as aboveProbable aspiration.   Elevated LFTs Continue to trend LFTs  Acute blood loss anemia: No evidence of active bleeding   FAMILY  - Updates:   Trenna Kiely S. [facility] ANP-BC Pulmonary and Critical Care Medicine [facility] Pager [phone] or [phone]  NB: This document was prepared using Dragon voice recognition software and may include unintentional dictation errors.   09/05/2017, 9:37 PM

## 2017-09-06 ENCOUNTER — Telehealth: Payer: Self-pay | Admitting: Oncology

## 2017-09-06 LAB — GLUCOSE, CAPILLARY
GLUCOSE-CAPILLARY: 119 mg/dL — AB (ref 70–99)
GLUCOSE-CAPILLARY: 109 mg/dL — AB (ref 70–99)
Glucose-Capillary: 121 mg/dL — ABNORMAL HIGH (ref 70–99)
Glucose-Capillary: 144 mg/dL — ABNORMAL HIGH (ref 70–99)
Glucose-Capillary: 169 mg/dL — ABNORMAL HIGH (ref 70–99)

## 2017-09-06 MED ORDER — GABAPENTIN 300 MG PO CAPS
600.0000 mg | ORAL_CAPSULE | Freq: Three times a day (TID) | ORAL | Status: DC
  Filled 2017-09-06: qty 2

## 2017-09-06 MED ORDER — FENTANYL 25 MCG/HR TD PT72
25.0000 ug | MEDICATED_PATCH | TRANSDERMAL | Status: DC
  Administered 2017-09-06: 25 ug via TRANSDERMAL
  Filled 2017-09-06: qty 1

## 2017-09-06 MED ORDER — TRACE MINERALS CR-CU-MN-SE-ZN 10-1000-500-60 MCG/ML IV SOLN
INTRAVENOUS | Status: DC
  Administered 2017-09-06: 18:00:00 via INTRAVENOUS
  Filled 2017-09-06: qty 1992

## 2017-09-06 MED ORDER — FAT EMULSION PLANT BASED 20 % IV EMUL
250.0000 mL | INTRAVENOUS | Status: AC
  Administered 2017-09-06: 250 mL via INTRAVENOUS
  Filled 2017-09-06 (×2): qty 250

## 2017-09-06 NOTE — Progress Notes (Signed)
[place] Hospital Day(s): 16.   Post op day(s): 9 Days Post-Op.   Interval History: Patient seen and examined, transferred to ICU overnight due to worsened respiratory distress. Patient interacts, but alertness and confusion fluctuate, often incomprehensive. Patient continues to report pain, while nursing expresses frustration with inability to adequately control pain due to conflicting orders between treatment and comfort. Feces continues to drain from patient's surgical wound and around drain.  Review of Systems: limited to HPI due to patient's confusion and overall status  Vital signs in last 24 hours: [min-max] current  Temp:  [96.2 F (35.7 C)-98.3 F (36.8 C)] 97.9 F (36.6 C) (07/05 0400) Pulse Rate:  [54-80] 57 (07/05 0600) Resp:  [6-20] 7 (07/05 0600) BP: (102-174)/(34-88) 115/34 (07/05 0600) SpO2:  [95 %-100 %] 100 % (07/05 0600) FiO2 (%):  [30 %-50 %] 30 % (07/05 0201) Weight:  [311 lb 1.1 oz (141.1 kg)] 311 lb 1.1 oz (141.1 kg) (07/05 0407)     Height: 5\' 5"  (165.1 cm) Weight: (!) 311 lb 1.1 oz (141.1 kg) BMI (Calculated): 51.76   Intake/Output this shift:  No intake/output data recorded.   Intake/Output last 2 shifts:  @IOLAST2SHIFTS @   Physical Exam:  Constitutional: alert, cooperative and no distress  Respiratory: breathing non-labored at rest  Cardiovascular: regular rate and sinus rhythm  Gastrointestinal: soft, obese, and non-distended with moderate peri-incisional tenderness to palpation, feces draining from surgical incision and through/around colonic decompression catheter, clear yellow fluid in drainage bulb suction  Labs:  CBC Latest Ref Rng & Units 09/03/2017 09/02/2017 09/01/2017  WBC 3.6 - 11.0 K/uL 22.1(H) 21.5(H) 21.0(H)  Hemoglobin 12.0 - 16.0 g/dL 9.2(L) 9.9(L) 9.7(L)  Hematocrit 35.0 - 47.0 % 28.2(L) 30.6(L) 30.3(L)  Platelets 150 - 440 K/uL 381 420 401   CMP Latest Ref Rng & Units 09/05/2017 09/04/2017 09/04/2017  Glucose 70 - 99 mg/dL  148(H) 174(H) -  BUN 8 - 23 mg/dL 95(H) 68(H) -  Creatinine 0.44 - 1.00 mg/dL 1.99(H) 1.50(H) 1.43(H)  Sodium 135 - 145 mmol/L 138 141 -  Potassium 3.5 - 5.1 mmol/L 3.9 3.7 -  Chloride 98 - 111 mmol/L 110 112(H) -  CO2 22 - 32 mmol/L 17(L) 19(L) -  Calcium 8.9 - 10.3 mg/dL 7.8(L) 7.7(L) -  Total Protein 6.5 - 8.1 g/dL 6.0(L) - -  Total Bilirubin 0.3 - 1.2 mg/dL 0.6 - -  Alkaline Phos 38 - 126 U/L 200(H) - -  AST 15 - 41 U/L 127(H) - -  ALT 0 - 44 U/L 52(H) - -   Imaging studies: No new pertinent imaging studies   Assessment/Plan: 77 y.o.femalewith distal large bowel obstruction attributed to external compression by extensive diffuse peritoneal carcinomatosis of likely ovarian etiology s/p laparotomy with unsuccessful attempted colostomy instead with decompressing colon-drainage tube ([place], 08/16/2017) and s/p decompressing colonic stents x2 [facility], 2/44/0102), complicated bypre-stent aspiration pneumonitis, increasing leukocytosis,AKI, delirium, and comorbidities includingmorbid obesity (BMI 47), HTN, hypercholesterolemia, thyroid disease, and chronic back pain.  - pain control prn - antibiotics + nutritional support - attempted to answer as many of patient's and her family's questions as possible - considering persistent and increasingly uncorrectable challenges, palliative care consultation appreciated - medical management of comorbidities as per ICU/primary team  - anticipate family decision towards comfort care - no further surgical intervention - DVT prophylaxis  All of the above findings and recommendations were discussed with the patient, patient's family, palliative RN, Dr. Tasia Catchings and Dr. Jerelyn Charles, and all of patient's and family's questions  were answered to their expressed satisfaction.   Thank you for the opportunity to participate in this patient's care.  -- Marilynne Drivers Rosana Hoes, MD,  [place]: [facility] - Partnering for exceptional care. Office: [phone]

## 2017-09-06 NOTE — Progress Notes (Signed)
[place] [facility] [place] NAME: Alicia Conley    MR#:  [id number]  DATE OF BIRTH:  19-Dec-1940  SUBJECTIVE:  CHIEF COMPLAINT:  No chief complaint on file. Case discussed with intensivist, condition is terminal, hospice to be readdressed to family later today in discussion with general surgery, transferred to the ICU given respiratory distress overnight  REVIEW OF SYSTEMS:  CONSTITUTIONAL: No fever, fatigue or weakness.  EYES: No blurred or double vision.  EARS, NOSE, AND THROAT: No tinnitus or ear pain.  RESPIRATORY: No cough, shortness of breath, wheezing or hemoptysis.  CARDIOVASCULAR: No chest pain, orthopnea, edema.  GASTROINTESTINAL: No nausea, vomiting, diarrhea or abdominal pain.  GENITOURINARY: No dysuria, hematuria.  ENDOCRINE: No polyuria, nocturia,  HEMATOLOGY: No anemia, easy bruising or bleeding SKIN: No rash or lesion. MUSCULOSKELETAL: No joint pain or arthritis.   NEUROLOGIC: No tingling, numbness, weakness.  PSYCHIATRY: No anxiety or depression.   ROS  DRUG ALLERGIES:  No Known Allergies  VITALS:  Blood pressure (!) 115/34, pulse (!) 57, temperature 97.9 F (36.6 C), temperature source Axillary, resp. rate (!) 7, height 5\' 5"  (1.651 m), weight (!) 141.1 kg (311 lb 1.1 oz), SpO2 100 %.  PHYSICAL EXAMINATION:  GENERAL:  77 y.o.-year-old patient lying in the bed with no acute distress.  EYES: Pupils equal, round, reactive to light and accommodation. No scleral icterus. Extraocular muscles intact.  HEENT: Head atraumatic, normocephalic. Oropharynx and nasopharynx clear.  NECK:  Supple, no jugular venous distention. No thyroid enlargement, no tenderness.  LUNGS: Normal breath sounds bilaterally, no wheezing, rales,rhonchi or crepitation. No use of accessory muscles of respiration.  CARDIOVASCULAR: S1, S2 normal. No murmurs, rubs, or gallops.  ABDOMEN: Soft, nontender, nondistended. Bowel sounds present. No organomegaly or mass.   EXTREMITIES: No pedal edema, cyanosis, or clubbing.  NEUROLOGIC: Cranial nerves II through XII are intact. Muscle strength 5/5 in all extremities. Sensation intact. Gait not checked.  PSYCHIATRIC: The patient is alert and oriented x 3.  SKIN: No obvious rash, lesion, or ulcer.   Physical Exam LABORATORY PANEL:   CBC Recent Labs  Lab 09/03/17 0533  WBC 22.1*  HGB 9.2*  HCT 28.2*  PLT 381   ------------------------------------------------------------------------------------------------------------------  Chemistries  Recent Labs  Lab 09/05/17 0614  NA 138  K 3.9  CL 110  CO2 17*  GLUCOSE 148*  BUN 95*  CREATININE 1.99*  CALCIUM 7.8*  MG 2.4  AST 127*  ALT 52*  ALKPHOS 200*  BILITOT 0.6   ------------------------------------------------------------------------------------------------------------------  Cardiac Enzymes No results for input(s): TROPONINI in the last 168 hours. ------------------------------------------------------------------------------------------------------------------  RADIOLOGY:  No results found.  ASSESSMENT AND PLAN:  77 year old female patient with history of hypertension, hyperlipidemia, hypothyroidism referred from GI clinic for direct admission for abdominal discomfort.  * Acute hypoxic resp failure Aspiration? Bronchitis? Pulm edema. 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 Alice Reichert Discussed with Dr. 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 Fluconazole added7/04/2017 General surgery input appreciated-await further recommendations  * Hypokalemia and hypomagnesemia Resolved  * Chronic benign essential hypertension Continue  current regiment  * ChronicHyperlipidemia Resume statinupon discharge  Prognosis terminal/dismal  On TPN  All the records are reviewed and case discussed with Care Management/Social Workerr. Management plans discussed with the patient, family and they are in agreement.  CODE STATUS: dnr  TOTAL TIME TAKING CARE OF THIS PATIENT: 35 minutes.     POSSIBLE D/C IN 5 DAYS, DEPENDING ON CLINICAL CONDITION.   Avel Peace Salary M.D on 09/06/2017   Between 7am to 6pm - Pager - [phone]  After 6pm go to [email or web address] - password [email or web address] [facility]  Office  [phone]  CC: Primary care physician; [name], MD  Note: This dictation was prepared with Dragon dictation along with smaller phrase technology. Any transcriptional errors that result from this process are unintentional.

## 2017-09-06 NOTE — Progress Notes (Signed)
Hematology/Oncology Progress Note [facility] [phone] [phone]  Patient Care Team: [name], MD as PCP - General (Family Medicine) Clent Jacks, RN as Registered Nurse   Name of the patient: Alicia Conley  [id number]  [birth year]  Date of visit: 09/06/17   INTERVAL HISTORY-  Patient was seen at bedside.  Remains confused.  Continues to decline, transferred to ICU.  Family members at bedside.   Met husband, daughter and granddaughter [facility]ia [facility].   Review of systems- Review of Systems  Unable to perform ROS: Mental status change    No Known Allergies  Patient Active Problem List   Diagnosis Date Noted  . Wound cellulitis after surgery   . Goals of care, counseling/discussion   . Large bowel obstruction (Alicia Conley)   . Abdominal carcinomatosis (Alicia Conley)   . Abdominal pain 08/05/2017  . Ileus (Alicia Conley) 08/04/2017     Past Medical History:  Diagnosis Date  . Cancer (Alicia Conley)   . High cholesterol   . Hypertension   . Thyroid disease      Past Surgical History:  Procedure Laterality Date  . BREAST BIOPSY Left 11/07/12   u/s bx lt -neg  . BREAST CYST ASPIRATION Left    neg  . COLONIC STENT PLACEMENT N/A 08/29/2017   Procedure: COLONIC STENT PLACEMENT;  Surgeon: Toledo, Benay Pike, MD;  Location: [facility] ENDOSCOPY;  Service: Gastroenterology;  Laterality: N/A;  . EP IMPLANTABLE DEVICE N/A 04/03/2016   Procedure: Loop Recorder Insertion;  Surgeon: Isaias Cowman, MD;  Location: [facility];  Service: Cardiovascular;  Laterality: N/A;  . FLEXIBLE SIGMOIDOSCOPY N/A 09/01/2017   Procedure: FLEXIBLE SIGMOIDOSCOPY;  Surgeon: Toledo, Benay Pike, MD;  Location: [facility] ENDOSCOPY;  Service: Gastroenterology;  Laterality: N/A;  . FLEXIBLE SIGMOIDOSCOPY N/A 08/03/2017   Procedure: FLEXIBLE SIGMOIDOSCOPY;  Surgeon: Toledo, Benay Pike, MD;  Location: [facility] ENDOSCOPY;  Service: Gastroenterology;  Laterality: N/A;  . HERNIA REPAIR    .  LAPAROTOMY N/A 08/07/2017   Procedure: EXPLORATORY LAPAROTOMY;  Surgeon: Jules Husbands, MD;  Location: [facility] ORS;  Service: General;  Laterality: N/A;  . OOPHORECTOMY Bilateral     Social History   Socioeconomic History  . Marital status: Married    Spouse name: Not on file  . Number of children: Not on file  . Years of education: Not on file  . Highest education level: Not on file  Occupational History  . Not on file  Social Needs  . Financial resource strain: Not on file  . Food insecurity:    Worry: Not on file    Inability: Not on file  . Transportation needs:    Medical: Not on file    Non-medical: Not on file  Tobacco Use  . Smoking status: Never Smoker  . Smokeless tobacco: Never Used  Substance and Sexual Activity  . Alcohol use: Never    Frequency: Never  . Drug use: Never  . Sexual activity: Never  Lifestyle  . Physical activity:    Days per week: Not on file    Minutes per session: Not on file  . Stress: Not on file  Relationships  . Social connections:    Talks on phone: Not on file    Gets together: Not on file    Attends religious service: Not on file    Active member of club or organization: Not on file    Attends meetings of clubs or organizations: Not on file    Relationship status: Not on file  .  Intimate partner violence:    Fear of current or ex partner: Not on file    Emotionally abused: Not on file    Physically abused: Not on file    Forced sexual activity: Not on file  Other Topics Concern  . Not on file  Social History Narrative  . Not on file     Family History  Problem Relation Age of Onset  . Breast cancer Paternal Aunt 58       currently 61s  . Breast cancer Cousin 88       daughter of unaffected maternal aunt; currently 83  . Prostate cancer Brother        currently 20s  . Leukemia Cousin 12       son of paternal aunt with breast cancer; currently 71     Current Facility-Administered Medications:  .  acetaminophen  (TYLENOL) tablet 1,000 mg, 1,000 mg, Oral, Q6H, Vickie Epley, MD, 1,000 mg at 09/06/17 0946 .  albuterol (PROVENTIL) (2.5 MG/3ML) 0.083% nebulizer solution 2.5 mg, 2.5 mg, Nebulization, Q3H PRN, Wilhelmina Mcardle, MD, 2.5 mg at 09/04/17 1827 .  alum & mag hydroxide-simeth (MAALOX/MYLANTA) 200-200-20 MG/5ML suspension 15 mL, 15 mL, Oral, Q4H PRN, Pabon, Diego F, MD, 15 mL at 09/01/17 1052 .  aspirin EC tablet 81 mg, 81 mg, Oral, Daily, Pabon, [place] F, MD, 81 mg at 09/06/17 0944 .  bisacodyl (DULCOLAX) suppository 10 mg, 10 mg, Rectal, Daily PRN, Pabon, Diego F, MD .  chlorhexidine gluconate (MEDLINE KIT) (PERIDEX) 0.12 % solution 15 mL, 15 mL, Mouth Rinse, BID, Blakeney, Dana G, NP, 15 mL at 09/05/17 0913 .  enoxaparin (LOVENOX) injection 40 mg, 40 mg, Subcutaneous, Q12H, Sudini, Srikar, MD, 40 mg at 09/06/17 0944 .  Fat emulsion 20 % infusion 250 mL, 250 mL, Intravenous, Continuous TPN, Salary, Montell D, MD .  feeding supplement (BOOST / RESOURCE BREEZE) liquid 1 Container, 1 Container, Oral, TID BM, Vickie Epley, MD, 1 Container at 09/04/17 2050 .  fentaNYL (DURAGESIC - dosed mcg/hr) patch 25 mcg, 25 mcg, Transdermal, Q72H, Flora Lipps, MD, 25 mcg at 09/06/17 1348 .  fluconazole (DIFLUCAN) IVPB 200 mg, 200 mg, Intravenous, Daily, Rocky Morel, RPH, Stopped at 09/05/17 1107 .  fluticasone (FLONASE) 50 MCG/ACT nasal spray 1 spray, 1 spray, Each Nare, Daily PRN, Pabon, Diego F, MD .  gabapentin (NEURONTIN) capsule 600 mg, 600 mg, Oral, TID, Mortimer Fries, Kurian, MD .  haloperidol lactate (HALDOL) injection 0.5 mg, 0.5 mg, Intravenous, Q8H PRN, 0.5 mg at 09/05/17 1649 **OR** haloperidol (HALDOL) tablet 0.5 mg, 0.5 mg, Oral, Q8H PRN, Pickenpack-Cousar, Athena N, NP .  hydrALAZINE (APRESOLINE) injection 10 mg, 10 mg, Intravenous, Q4H PRN, Pabon, Diego F, MD, 10 mg at 09/04/17 0649 .  HYDROmorphone (DILAUDID) injection 1 mg, 1 mg, Intravenous, Q2H PRN, Salary, Montell D, MD, 1 mg at 09/06/17 0527 .   insulin aspart (novoLOG) injection 0-9 Units, 0-9 Units, Subcutaneous, Q4H, Salary, Montell D, MD, 1 Units at 09/06/17 0859 .  ipratropium-albuterol (DUONEB) 0.5-2.5 (3) MG/3ML nebulizer solution 3 mL, 3 mL, Nebulization, Q6H, Sudini, Srikar, MD, 3 mL at 09/06/17 1305 .  levothyroxine (SYNTHROID, LEVOTHROID) tablet 75 mcg, 75 mcg, Oral, QAC breakfast, Pabon, [place] F, MD, 75 mcg at 09/06/17 0943 .  metoprolol tartrate (LOPRESSOR) tablet 100 mg, 100 mg, Oral, BID, Pabon, Diego F, MD, 100 mg at 09/06/17 0944 .  ondansetron (ZOFRAN) tablet 4 mg, 4 mg, Oral, Q6H PRN **OR** ondansetron (ZOFRAN) injection 4 mg, 4 mg,  Intravenous, Q6H PRN, Pabon, Diego F, MD, 4 mg at 08/27/17 2048 .  piperacillin-tazobactam (ZOSYN) IVPB 3.375 g, 3.375 g, Intravenous, Q8H, Rocky Morel, RPH, Last Rate: 12.5 mL/hr at 09/06/17 0556, 3.375 g at 09/06/17 0556 .  sertraline (ZOLOFT) tablet 100 mg, 100 mg, Oral, Daily, Pabon, [place] F, MD, 100 mg at 09/06/17 0943 .  sodium chloride flush (NS) 0.9 % injection 10-40 mL, 10-40 mL, Intracatheter, Q12H, Salary, Montell D, MD, 10 mL at 09/05/17 2218 .  sodium chloride flush (NS) 0.9 % injection 10-40 mL, 10-40 mL, Intracatheter, PRN, Salary, Montell D, MD, 10 mL at 09/04/17 0650 .  TPN (CLINIMIX) Adult without lytes, , Intravenous, Continuous TPN, Salary, Montell D, MD, Last Rate: 83 mL/hr at 09/05/17 1820 .  TPN (CLINIMIX) Adult without lytes, , Intravenous, Continuous TPN, Salary, Montell D, MD .  traMADol (ULTRAM) tablet 50 mg, 50 mg, Oral, Q6H, Sudini, Srikar, MD, 50 mg at 09/06/17 0946   Physical exam:  Vitals:   09/06/17 0407 09/06/17 0500 09/06/17 0600 09/06/17 1031  BP:  (!) 118/52 (!) 115/34   Pulse:  (!) 56 (!) 57   Resp:  (!) 8 (!) 7   Temp:      TempSrc:      SpO2:  100% 100% 100%  Weight: (!) 311 lb 1.1 oz (141.1 kg)     Height:       Physical Exam  GENERAL lethargic  sKIN: Abdominal wound cellulitis with wound VAC. LUNGS: Decreased breathing sounds  bilaterally. HEART:  S1 normal and S2 normal  ABDOMEN: Abdomen open wound.  Distended and diffusely tender. EXTREMITIES: trace edema b/l LE.,  NEURO: confused.      CMP Latest Ref Rng & Units 09/05/2017  Glucose 70 - 99 mg/dL 148(H)  BUN 8 - 23 mg/dL 95(H)  Creatinine 0.44 - 1.00 mg/dL 1.99(H)  Sodium 135 - 145 mmol/L 138  Potassium 3.5 - 5.1 mmol/L 3.9  Chloride 98 - 111 mmol/L 110  CO2 22 - 32 mmol/L 17(L)  Calcium 8.9 - 10.3 mg/dL 7.8(L)  Total Protein 6.5 - 8.1 g/dL 6.0(L)  Total Bilirubin 0.3 - 1.2 mg/dL 0.6  Alkaline Phos 38 - 126 U/L 200(H)  AST 15 - 41 U/L 127(H)  ALT 0 - 44 U/L 52(H)   CBC Latest Ref Rng & Units 09/03/2017  WBC 3.6 - 11.0 K/uL 22.1(H)  Hemoglobin 12.0 - 16.0 g/dL 9.2(L)  Hematocrit 35.0 - 47.0 % 28.2(L)  Platelets 150 - 440 K/uL 381   RADIOGRAPHIC STUDIES: I have personally reviewed the radiological images as listed and agreed with the findings in the report. Ct Abdomen Pelvis Wo Contrast  Result Date: 08/29/2017 CLINICAL DATA:  Abdominal xray revealed collection of air below the left hemidiaphragm likely intraluminal, however unable to exclude a component of free peritoneal air radiologist recommended CT Abd Pelvis. Patient has abdominal pain. Medical record history reports a laparotomy on 08/16/2017. EXAM: CT ABDOMEN AND PELVIS WITHOUT CONTRAST TECHNIQUE: Multidetector CT imaging of the abdomen and pelvis was performed following the standard protocol without IV contrast. COMPARISON:  Current abdomen radiographs. Prior CT dated 08/22/2017. FINDINGS: Lower chest: Small pleural effusions. There is dependent lung base opacity, left greater than right, consistent with atelectasis. Heart is normal in size. Hepatobiliary: There are 4 low-density liver masses consistent with cysts, largest from the posterior segment of the right lobe measuring 5 cm. No other liver abnormalities. The gallbladder is distended. There is dependent density in the gallbladder consistent  with sludge, stones  or a combination. No wall thickening. No bile duct dilation. Pancreas: Unremarkable. No pancreatic ductal dilatation or surrounding inflammatory changes. Spleen: Normal in size without focal abnormality. Adrenals/Urinary Tract: No adrenal masses. Mild to moderate right hydronephrosis and hydroureter. Right ureter is dilated into the pelvis. It is decompressed beginning just above the posterior bladder. No left hydronephrosis. Left ureter is normal course and in caliber. No intrarenal stones. Hyperattenuating mass, average Hounsfield units of 39, arises from the upper pole the right kidney, likely a cyst complicated by hemorrhage or proteinaceous contents. It measures 15 mm. There is a smaller more subtle similar-appearing mass from the medial upper pole the left kidney. No other renal masses. Bladder is decompressed by Foley catheter. Stomach/Bowel: The distal sigmoid colon is irregular with adjacent nodular opacities, consistent with colon carcinoma. Above this irregular area of sigmoid colon lies a stent which extends from the mid sigmoid colon to the lower descending colon. There is a percutaneously placed 2 at enters the left mid abdomen. The tip of this 2 has a configuration consistent with a gastrostomy tube. However, this lies in the:. There is significant subcutaneous air adjacent to the tube in the left mid abdomen lateral to the umbilicus. This portion of the colon is mostly decompressed. Right colon is normal in caliber. An no inflammatory changes. Stomach is mild-to-moderately distended. There is dependent fluid with some dependent contrast. No wall thickening or adjacent inflammation. Small bowel is normal in caliber. There is free intraperitoneal air that is collecting along the anterior mid to upper abdomen, accounting for the abnormality noted on the current radiographs. A trace amount of dense material lies adjacent to the spleen where it is in close proximity to the gastric  fundus. Other small foci of dense material lie along the surface of the right liver lobe. These findings were present on the CT from 08/22/2017 and suggest peritoneal carcinomatosis. No ascites. Vascular/Lymphatic: No pathologically enlarged lymph nodes. Aortic atherosclerosis. Reproductive: Uterus is normal size and configuration. No ovarian/adnexal masses. Other: There is postsurgical scarring with apparent hernia mesh along the anterior abdominal midline stable from the prior CT. A surgical drainage catheter enters the left lower quadrant. Musculoskeletal: No acute fracture or acute finding. 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 No osteoblastic or osteolytic lesions. IMPRESSION: 1. There is free intraperitoneal air. This is greater than expected 4 days post laparotomy, but still could be postsurgical in origin. 2. There is no definite source for this air. However, there is a tube that has the appearance of a gastrostomy tube. This enters the left transverse colon. This is likely a colonic tube for decompression. There is air surrounding the tube in the subcutaneous soft tissues at its insertion site. This is a possible source of an air leak. 3. 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 4. There is mild to moderate right hydronephrosis and right hydroureter. The partial obstruction is at the level of the neoplastic changes around the sigmoid colon. This is also stable from prior CT. Electronically Signed   By: Lajean Manes M.D.   On: 08/29/2017 20:59   Dg Abd 1 View  Result Date: 09/01/2017 CLINICAL DATA:  Carcinomatosis with large-bowel obstruction. Bilious vomiting. EXAM: ABDOMEN - 1 VIEW COMPARISON:  CT scan August 29, 2017 FINDINGS: There is a feathery pattern of gas identified over the abdomen. Some of this gas extend outside of the  abdominal cavity. Based on  previous CT imaging, I suspect this represents increasing gas in the subcutaneous tissues. This significantly obscures evaluation of the abdomen. No convincing evidence of free air. A stent and tube again project over the pelvis/lower abdomen. Contrast is seen in the rectum. Hernia mesh is noted. No definitive distended bowel to suggest ongoing obstruction. IMPRESSION: 1. The feathery pattern of air diffusely over the lower chest and abdomen suggests increasing air in the subcutaneous fat. This significantly obscures the remainder of the abdomen but there is no definitive bowel dilatation to suggest ongoing obstruction. Electronically Signed   By: Dorise Bullion III M.D   On: 09/01/2017 18:50   Dg Abd 1 View  Result Date: 08/07/2017 CLINICAL DATA:  Initial evaluation for enteric tube placement. EXAM: ABDOMEN - 1 VIEW COMPARISON:  None. FINDINGS: Enteric tube in place with tip in side hole seen overlying the stomach, well beyond the GE junction. Tip projects distally. Visualized bowel gas pattern within normal limits. IMPRESSION: Enteric tube in place with tip and side hole overlying the stomach, well beyond the GE junction. Electronically Signed   By: Jeannine Boga M.D.   On: 08/16/2017 20:20   Ct Abdomen Pelvis W Contrast  Result Date: 08/22/2017 CLINICAL DATA:  Follow-up CT scan concerning for bowel obstruction. LEFT lower quadrant pain abdominal pain for 2 weeks. Last bowel movement 1 week ago. EXAM: CT ABDOMEN AND PELVIS WITH CONTRAST TECHNIQUE: Multidetector CT imaging of the abdomen and pelvis was performed using the standard protocol following bolus administration of intravenous contrast. CONTRAST:  34m ISOVUE-300 IOPAMIDOL (ISOVUE-300) INJECTION 61% COMPARISON:  CT 11/28/2013 FINDINGS: Lower chest: Lung bases are clear. Hepatobiliary: Small nodular densities along the surface of the RIGHT hepatic lobe (image 28/2 image 33/2). Nodules measure from 3 to 5 mm are partially calcified. Sludge or  stones in the gallbladder. Small amount pericholecystic fluid adjacent to fundus of the bladder. No biliary duct dilatation. The low several low-density cysts in the liver. Pancreas: Pancreas is normal. No ductal dilatation. No pancreatic inflammation. Spleen: Normal spleen Adrenals/urinary tract: Adrenal glands are normal. Several intermediate density or enhancing lesions the renal cortex. For example 14 mm lesion along the lateral margin of theRIGHT lower pole (image 44/2). Cortical lesion measuring 18 mm on the anterior upper pole on the LEFT (image 24/2). There is mild hydroureter on the RIGHT hydroureter extends to the level of the pelvis. There is peritoneal nodularity in the pelvis mesentery measuring 18 mm appears to be fanning out in the colonic mesentery (image 75/2.) This may obstruct the ureter. Bladder is normal Stomach/Bowel: Stomach, duodenum and small bowel are normal. The ascending, transverse and descending colon are fluid-filled and without haustral markings. This fluid-filled colon extends the level sigmoid colon with there is caliber change. This is in the region of the mesenteric nodularity described renal section. The sigmoid colon is narrowed with potential calcified peritoneal densities (image 73/2). Rectum normal Vascular/Lymphatic: Abdominal aorta is normal caliber. No periportal or retroperitoneal adenopathy. No pelvic adenopathy. Reproductive: Uterus normal.  Ovaries not identified Other: There is a ventral hernia repair. There is enhancing nodularity along the ventral peritoneal surface with a frondlike appearance (image 40/2 for example). Small frondlike cluster measures 21 mm (image 40/2). This is similar to the mesenteric nodularity in the pelvis as well as the nodularity along the RIGHT hepatic lobe. Small calcified nodules also extends along the LEFT pericolic gutter. Musculoskeletal: No aggressive osseous lesion. IMPRESSION: 1. The ascending, transverse and descending colon is  ahaustral, fluid-filled, and mildly distended to the level of sigmoid colon. A caliber change at sigmoid colon with serosal nodularity concerning for obstruction from peritoneal carcinomatosis. 2. Nodular lesions which are partially calcified along the hepatic margin, pelvic sigmoid mesocolon pelvis as well as the ventral peritoneal space. 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 Partially calcified nodularity suggest potential ovarian source. The ventral midline peritoneal nodularity may most assessable for biopsy. 3. Mild entrapment of the distal RIGHT ureter associated with the mesenteric nodularity. 4. Indeterminate renal lesions. Recommend follow-up MRI renal protocol at some point. These results will be called to the ordering clinician or representative by the Radiologist Assistant, and communication documented in the PACS or zVision Dashboard. Electronically Signed   By: Suzy Bouchard M.D.   On: 08/22/2017 15:09   Dg Abd 2 Views  Result Date: 08/27/2017 CLINICAL DATA:  Bowel obstruction EXAM: ABDOMEN - 2 VIEW COMPARISON:  None. FINDINGS: Scattered gas containing small and large bowel loops without obstructive bowel gas pattern. Surgical tacks project over the mid abdomen presumably from ventral hernia repair. No free air, organomegaly nor suspicious radiopaque calculi. Lower lumbar facet arthropathy. IMPRESSION: Nonobstructed, nondistended bowel gas pattern. Electronically Signed   By: Ashley Royalty M.D.   On: 08/04/2017 19:55   Dg Abd Acute W/chest  Result Date: 08/26/2017 CLINICAL DATA:  Follow-up distal colonic obstruction. EXAM: DG ABDOMEN ACUTE W/ 1V CHEST COMPARISON:  August 25, 2017 FINDINGS: There is a small left effusion. The chest is otherwise stable. Lucency over the upper abdomen on the upright view may represent free postoperative air or air within the skin folder incision. Free air would be expected after surgery yesterday. The colonic loops are significantly improved with  placement of a colonic tube. No other change. IMPRESSION: 1. A colonic tube is been placed with decompression of the colon. 2. Probable postoperative free air, as expected. 3. No other change. Electronically Signed   By: Dorise Bullion III M.D   On: 08/26/2017 08:33   Dg Abd Acute W/chest  Result Date: 08/20/2017 CLINICAL DATA:  Preoperative examination. EXAM: DG ABDOMEN ACUTE W/ 1V CHEST COMPARISON:  08/24/2017; 08/29/2017; CT abdomen pelvis-08/22/2017 FINDINGS: Grossly unchanged cardiac silhouette and mediastinal contours with atherosclerotic plaque within the thoracic aorta. Loop recorder overlies the mid heart. Suspected slight reduction in persistent small left-sided effusion. Improved aeration of lung bases with persistent bibasilar opacities, left greater than right. No new focal airspace opacities. Pulmonary venous congestion without frank evidence of edema. No pneumothorax. No acute osseus abnormalities. Read demonstrated patulous distension of multiple loops of colon with index loop of descending colon within the left mid hemiabdomen measuring approximately 5.6 cm in diameter, similar to the 08/24/2017 examination. No pneumoperitoneum, pneumatosis or portal venous gas No definitive abnormal intra-abdominal calcifications. IMPRESSION: 1. Slight reduction in persistent small left-sided effusion and improved aeration of bilateral lung bases with persistent bibasilar opacities, atelectasis versus infiltrate. 2. Similar findings worrisome for distal colonic obstruction as demonstrated on recent abdominal CT. Electronically Signed   By: Sandi Mariscal M.D.   On: 08/29/2017 08:42   Dg Abd Acute W/chest  Result Date: 08/24/2017 CLINICAL DATA:  Lower abdominal pain. EXAM: DG ABDOMEN ACUTE W/ 1V CHEST COMPARISON:  Chest and abdominal x-rays from yesterday. FINDINGS: Stable cardiomediastinal silhouette. Loop recorder again noted. Mild pulmonary vascular congestion, slightly more conspicuous on today's study.  Unchanged small left pleural effusion and left basilar atelectasis. No pneumothorax. Stable mild dilatation of the colon. No pneumoperitoneum. No acute osseous abnormality. IMPRESSION: 1. Stable mild  colonic dilatation. 2. Unchanged small left pleural effusion and left basilar atelectasis. Electronically Signed   By: Titus Dubin M.D.   On: 08/24/2017 08:44   Dg Abd Acute W/chest  Result Date: 08/17/2017 CLINICAL DATA:  Left lower quadrant pain EXAM: DG ABDOMEN ACUTE W/ 1V CHEST COMPARISON:  08/22/2017 FINDINGS: Cardiac shadow is within normal limits. Loop recorder is noted. Left basilar atelectatic changes and small effusion are noted increased from the previous day. Right lung remains clear. No free air is noted. Mild dilatation of the colon is noted similar to that seen on the prior CT examination. Contrast is noted within the bladder. No bony abnormality is seen. IMPRESSION: Stable dilatation of the colon when compared with the prior exam. Increasing left basilar atelectasis and effusion. Electronically Signed   By: Inez Catalina M.D.   On: 08/15/2017 07:21   Dg Abd Portable 2v  Result Date: 08/29/2017 CLINICAL DATA:  Peritoneal carcinomatosis.  Colonic obstruction. EXAM: PORTABLE ABDOMEN - 2 VIEW COMPARISON:  Acute abdominal series 08/26/2017 and KUB 08/18/2017 FINDINGS: Examination demonstrates collection of air under the left hemidiaphragm on the upright film some which is intraluminal although it would be difficult to exclude free peritoneal air. Bowel gas pattern is otherwise nonobstructive. There are 2 catheters over the left lower quadrant/pelvis. Skin staples over the soft tissues of the lateral left pelvis/lower abdomen. Remainder of the exam is unchanged. IMPRESSION: Nonobstructive bowel gas pattern. Collection of air below the left hemidiaphragm on the upright film likely intraluminal although cannot exclude a component of free peritoneal air. Recommend clinical correlation and consider  left-side-down decubitus films to exclude free peritoneal air. Two catheters over the left lower quadrant/left pelvis. These results were called by telephone at the time of interpretation on 08/29/2017 at 5:28 pm to patient's nurse Council Mechanic, who verbally acknowledged these results and will notify patient's physician. Electronically Signed   By: Marin Olp M.D.   On: 08/29/2017 17:28   Dg C-arm 1-60 Min-no Report  Result Date: 08/30/2017 Fluoroscopy was utilized by the requesting physician.  No radiographic interpretation.   [place] Ekg Site Rite  Result Date: 08/27/2017 If Site Rite image not attached, placement could not be confirmed due to current cardiac rhythm.   Assessment and plan-  Patient is a 77 y.o. female who is currently admitted due to carcinomatosis, frozen abdomen, bowel obstruction s/p Laparotomy, abdominal wound infection, respiratory failure after procedure, s/p extubation, currently admitted to the ICU due to worsening respiratory symptoms..   # Abdominal carcinomatosis,   overall extremely poor prognosis due to multiple comorbidity, sepsis, respiratory failure. Not a chemo candidate for now and less likely in the future. In my opinion, she is an appropriate hospice candidate. Genetic testing was sent,  results likely will be back 2 to 3 weeks, likely not to be useful by patient as she continued to decline rapidly.  But still will serve valuable information for the family members.  #CODE STATUS DNR Multiple family members at bedside.#I had a lengthy discussion with husband, daughter and granddaughter, along with palliative care service and  Dr. Rosana Hoes.  Explained patient's current situation and the  extreme poor prognosis.  Recommend hospice and focus on comfort care.  Patient's daughter has a lot of questions and I tried my best answering all of them to her satisfaction. Unfortunately abdominal carcinomatosis can have very insidious onset and radiographically hard to detect  and to full-blown disease/complications occur.  Family appreciate discussion and voice understanding.  They are going to  discuss and update ICU nurse about their decision for hospice.  Thank you for allowing me to participate in the care of this patient.   Total face to face encounter time for this patient visit was 40 min. >50% of the time was  spent in counseling and coordination of care.   Earlie Server, MD, PhD Hematology Oncology [facility] Pager- [phone] 09/06/2017

## 2017-09-06 NOTE — Consult Note (Signed)
[facility] Inpatient Progress Note  Subjective: Patient with peritoneal carcinomatosis seen for f/u colonic obstruction s/p colonic stent. This appears stable, but patient appears hypoxic, septic and lethargic. Family meeting with palliative care team this morning a few days after declaring patient DNR status.  Objective: Vital signs in last 24 hours: Temp:  [96.2 F (35.7 C)-98.3 F (36.8 C)] 97.9 F (36.6 C) (07/05 0400) Pulse Rate:  [54-80] 57 (07/05 0600) Resp:  [6-13] 7 (07/05 0600) BP: (102-174)/(34-88) 115/34 (07/05 0600) SpO2:  [96 %-100 %] 100 % (07/05 1031) FiO2 (%):  [30 %-50 %] 30 % (07/05 0201) Weight:  [141.1 kg (311 lb 1.1 oz)] 141.1 kg (311 lb 1.1 oz) (07/05 0407) Blood pressure (!) 115/34, pulse (!) 57, temperature 97.9 F (36.6 C), temperature source Axillary, resp. rate (!) 7, height '5\' 5"'$  (1.651 m), weight (!) 141.1 kg (311 lb 1.1 oz), SpO2 100 %.    Intake/Output from previous day: 07/04 0701 - 07/05 0700 In: 1782.7 [I.V.:786.1; IV Piggyback:996.7] Out: 660 [Urine:600; Drains:60]  Intake/Output this shift: Total I/O In: 120 [P.O.:120] Out: -    General appearance:  Arousable, somewhat delirious Resp:  Coarse rhonchi bilaterally Cardio: Tachy no gallop GI:  Obese, diffusely tender without rebound. BS hypoactive but present. Extremities:  2+ edema.   Lab Results: Results for orders placed or performed during the hospital encounter of 08/30/2017 (from the past 24 hour(s))  Glucose, capillary     Status: Abnormal   Collection Time: 09/05/17  4:41 PM  Result Value Ref Range   Glucose-Capillary 138 (H) 70 - 99 mg/dL   Comment 1 Notify RN   Glucose, capillary     Status: Abnormal   Collection Time: 09/05/17  8:02 PM  Result Value Ref Range   Glucose-Capillary 152 (H) 70 - 99 mg/dL  Glucose, capillary     Status: Abnormal   Collection Time: 09/05/17 11:56 PM  Result Value Ref Range   Glucose-Capillary 153 (H) 70 - 99 mg/dL   Glucose, capillary     Status: Abnormal   Collection Time: 09/06/17  4:06 AM  Result Value Ref Range   Glucose-Capillary 169 (H) 70 - 99 mg/dL  Glucose, capillary     Status: Abnormal   Collection Time: 09/06/17  7:51 AM  Result Value Ref Range   Glucose-Capillary 144 (H) 70 - 99 mg/dL     No results for input(s): WBC, HGB, HCT, PLT in the last 72 hours. BMET Recent Labs    09/04/17 0433 09/05/17 0614  NA 141 138  K 3.7 3.9  CL 112* 110  CO2 19* 17*  GLUCOSE 174* 148*  BUN 68* 95*  CREATININE 1.50*  1.43* 1.99*  CALCIUM 7.7* 7.8*   LFT Recent Labs    09/05/17 0614  PROT 6.0*  ALBUMIN 2.0*  AST 127*  ALT 52*  ALKPHOS 200*  BILITOT 0.6   PT/INR No results for input(s): LABPROT, INR in the last 72 hours. Hepatitis Panel No results for input(s): HEPBSAG, HCVAB, HEPAIGM, HEPBIGM in the last 72 hours. C-Diff No results for input(s): CDIFFTOX in the last 72 hours. No results for input(s): CDIFFPCR in the last 72 hours.   Studies/Results: No results found.  Scheduled Inpatient Medications:   . acetaminophen  1,000 mg Oral Q6H  . aspirin EC  81 mg Oral Daily  . chlorhexidine gluconate (MEDLINE KIT)  15 mL Mouth Rinse BID  . enoxaparin (LOVENOX) injection  40 mg Subcutaneous Q12H  . feeding supplement  1  Container Oral TID BM  . fentaNYL  25 mcg Transdermal Q72H  . gabapentin  600 mg Oral TID  . insulin aspart  0-9 Units Subcutaneous Q4H  . ipratropium-albuterol  3 mL Nebulization Q6H  . levothyroxine  75 mcg Oral QAC breakfast  . metoprolol tartrate  100 mg Oral BID  . sertraline  100 mg Oral Daily  . sodium chloride flush  10-40 mL Intracatheter Q12H  . traMADol  50 mg Oral Q6H    Continuous Inpatient Infusions:   . Fat emulsion    . fluconazole (DIFLUCAN) IV Stopped (09/05/17 1107)  . piperacillin-tazobactam (ZOSYN)  IV 3.375 g (09/06/17 0556)  . TPN (CLINIMIX) Adult without lytes 83 mL/hr at 09/05/17 1820  . TPN (CLINIMIX) Adult without lytes       PRN Inpatient Medications:  albuterol, alum & mag hydroxide-simeth, bisacodyl, fluticasone, haloperidol lactate **OR** haloperidol, hydrALAZINE, HYDROmorphone (DILAUDID) injection, ondansetron **OR** ondansetron (ZOFRAN) IV, sodium chloride flush  Miscellaneous: N/a  Assessment:  1. 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 Not a candidate for chemotherapy per oncology team.  2. 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  Hypoxic respiratory failure 4.  DNR status 5. Poor prognosis   Plan:   1. Continue supportive care. NO new recommendations.  2. Following expectantly.   Landin Tallon K. Alice Reichert, M.D. 09/06/2017, 2:44 PM

## 2017-09-06 NOTE — Consult Note (Signed)
Pharmacy Antibiotic Note  Alicia Conley is a 77 year old female s/p  Colonic decompressive tube now with possible abdominal wall cellulitis. Pharmacy has been consulted for Zosyn and fluconazole dosing.   Plan: Continue Zosyn 3.375 g IV q8h EI  Continue Fluconazole 200mg  IV Q24hr; will increase dose to 400mg  IV Q24hr if creatinine clearance is >/= 73mL/min.   Per ICU rounds on 7/5 will discontinue vancomycin and continue to monitor.    Height: 5\' 5"  (165.1 cm) Weight: (!) 311 lb 1.1 oz (141.1 kg) IBW/kg (Calculated) : 57  Temp (24hrs), Avg:97.4 F (36.3 C), Min:96.2 F (35.7 C), Max:98.3 F (36.8 C)  Recent Labs  Lab 09/01/17 0649 09/02/17 0305 09/03/17 0533 09/03/17 1614 09/04/17 0433 09/04/17 2253 09/05/17 0614  WBC 21.0* 21.5* 22.1*  --   --   --   --   CREATININE 1.02* 1.07* 1.19*  --  1.50*  1.43*  --  1.99*  VANCOTROUGH  --   --   --  22*  --  19  --     Estimated Creatinine Clearance: 34.4 mL/min (A) (by C-G formula based on SCr of 1.99 mg/dL (H)).    No Known Allergies  Antimicrobials this admission: unasyn 6/28 >> 7/2 vancomycin 6/29 >> 7/5 Zosyn  7/2 >> Fluconazole 7/2>>  Microbiology results: 6/28 MRSA PCR: negative    Thank you for allowing pharmacy to be a part of this patient's care.  MLS 09/06/2017 5:34 PM

## 2017-09-06 NOTE — Progress Notes (Signed)
PT Cancellation Note  Patient Details Name: Alicia Conley MRN: [id number] DOB: January 05, 1941   Cancelled Treatment:    Reason Eval/Treat Not Completed: Medical issues which prohibited therapy(Patient noted with transfer to CCU due to decline in respiratory status (requiring BiPAP).  Per policy, will require new orders to resume PT service if/when patient medically appropriate.  Noting extended discussions with family/care team regarding goals of care.)   Vanna Shavers H. Owens Shark, PT, DPT, NCS 09/06/17, 8:11 AM [phone]

## 2017-09-06 NOTE — Progress Notes (Signed)
PHARMACY - ADULT TOTAL PARENTERAL NUTRITION CONSULT NOTE   Pharmacy Consult for TPN Indication: Inadequate oral intake related to poor appetite, nausea, vomiting   Patient Measurements: Height: 5\' 5"  (165.1 cm) Weight: (!) 311 lb 1.1 oz (141.1 kg) IBW/kg (Calculated) : 57 TPN AdjBW (KG): 74.5 Body mass index is 51.76 kg/m.  Assessment:  77 yo female with distal bowel obstruction.   TPN Access: central line in place TPN start date: 08/27/17 Nutritional Goals (per RD recommendation on 08/27/17): KCal: 2014 Protein: 100g Fluid: 2292  This TPN provides 100 g of protein, 150 g of dextrose, and 60 g of lipids which provides 1776 kCals per day, meeting 90% of patient needs  Goal TPN rate is 83 ml/hr  Plan:  Continue Clinimix plain 5/15 TPN without lytes at 34mL/hr with MVI and trace elements. Restart 20% lipids at 25 mL/hr for 12 hours per dietician.  Continue current SSI q4h order Clear liquid diet ordered and patient not eating much Monitor TPN labs in AM  Simpson,Michael L 09/06/2017,5:25 PM

## 2017-09-07 LAB — BASIC METABOLIC PANEL
ANION GAP: 11 (ref 5–15)
BUN: 140 mg/dL — ABNORMAL HIGH (ref 8–23)
CALCIUM: 7.4 mg/dL — AB (ref 8.9–10.3)
CHLORIDE: 105 mmol/L (ref 98–111)
CO2: 16 mmol/L — AB (ref 22–32)
CREATININE: 3.6 mg/dL — AB (ref 0.44–1.00)
GFR calc Af Amer: 13 mL/min — ABNORMAL LOW (ref >60)
GFR, EST NON AFRICAN AMERICAN: 11 mL/min — AB (ref >60)
Glucose, Bld: 125 mg/dL — ABNORMAL HIGH (ref 70–99)
POTASSIUM: 4.2 mmol/L (ref 3.5–5.1)
Sodium: 132 mmol/L — ABNORMAL LOW (ref 135–145)

## 2017-09-07 LAB — GLUCOSE, CAPILLARY
GLUCOSE-CAPILLARY: 129 mg/dL — AB (ref 70–99)
Glucose-Capillary: 119 mg/dL — ABNORMAL HIGH (ref 70–99)

## 2017-09-07 LAB — PHOSPHORUS: PHOSPHORUS: 7.8 mg/dL — AB (ref 2.5–4.6)

## 2017-09-07 LAB — MAGNESIUM: MAGNESIUM: 2.3 mg/dL (ref 1.7–2.4)

## 2017-09-07 MED ORDER — MORPHINE SULFATE (PF) 2 MG/ML IV SOLN
2.0000 mg | Freq: Once | INTRAVENOUS | Status: AC
  Administered 2017-09-07: 2 mg via INTRAVENOUS

## 2017-09-07 MED ORDER — FAT EMULSION PLANT BASED 20 % IV EMUL
250.0000 mL | INTRAVENOUS | Status: DC
  Filled 2017-09-07 (×2): qty 250

## 2017-09-07 MED ORDER — MORPHINE SULFATE (PF) 2 MG/ML IV SOLN
INTRAVENOUS | Status: AC
  Administered 2017-09-07: 2 mg via INTRAVENOUS
  Filled 2017-09-07: qty 1

## 2017-09-07 MED ORDER — TRACE MINERALS CR-CU-MN-SE-ZN 10-1000-500-60 MCG/ML IV SOLN
INTRAVENOUS | Status: DC
  Filled 2017-09-07: qty 1992

## 2017-09-07 MED ORDER — MORPHINE BOLUS VIA INFUSION
5.0000 mg | INTRAVENOUS | Status: DC | PRN
  Administered 2017-09-07: 4 mg via INTRAVENOUS
  Filled 2017-09-07: qty 20

## 2017-09-07 MED ORDER — MORPHINE 100MG IN NS 100ML (1MG/ML) PREMIX INFUSION
10.0000 mg/h | INTRAVENOUS | Status: DC
  Administered 2017-09-07: 10 mg/h via INTRAVENOUS
  Administered 2017-09-07: 4 mg/h via INTRAVENOUS
  Filled 2017-09-07 (×2): qty 100

## 2017-09-07 NOTE — Progress Notes (Signed)
After further discussion with Dr. Rosana Hoes and Husband of patient, patient has very poor prognosis and in multiorgan failure and suffering and struggling in pain, the family  have agreed and consented to comfort care measures.  Will place comfort care order set once family arrives.    Family are satisfied with Plan of action and management. All questions answered  Corrin Parker, M.D.  [facility]  Medical Director [place] Director [facility] Cardio-Pulmonary Department

## 2017-09-07 NOTE — Progress Notes (Signed)
   09/02/2017 1420  Clinical Encounter Type  Visited With Patient and family together  Visit Type Follow-up  Titus followed up with patient and family; patient's daughter now present at bedside with other family members.  Chaplain offered silent prayers and emotional support to patient's spouse.  Chaplain encouraged family to have chaplain paged as needed.

## 2017-09-07 NOTE — Progress Notes (Signed)
[facility] [place] NAME: Alicia Conley    MR#:  [id number]  DATE OF BIRTH:  1940-08-13  SUBJECTIVE:  CHIEF COMPLAINT:  No chief complaint on file. Continue medical decline  REVIEW OF SYSTEMS:  CONSTITUTIONAL: No fever, fatigue or weakness.  EYES: No blurred or double vision.  EARS, NOSE, AND THROAT: No tinnitus or ear pain.  RESPIRATORY: No cough, shortness of breath, wheezing or hemoptysis.  CARDIOVASCULAR: No chest pain, orthopnea, edema.  GASTROINTESTINAL: No nausea, vomiting, diarrhea or abdominal pain.  GENITOURINARY: No dysuria, hematuria.  ENDOCRINE: No polyuria, nocturia,  HEMATOLOGY: No anemia, easy bruising or bleeding SKIN: No rash or lesion. MUSCULOSKELETAL: No joint pain or arthritis.   NEUROLOGIC: No tingling, numbness, weakness.  PSYCHIATRY: No anxiety or depression.   ROS  DRUG ALLERGIES:  No Known Allergies  VITALS:  Blood pressure (!) 116/46, pulse 64, temperature 97.6 F (36.4 C), temperature source Axillary, resp. rate (!) 8, height 5\' 5"  (1.651 m), weight 136 kg (299 lb 13.2 oz), SpO2 99 %.  PHYSICAL EXAMINATION:  GENERAL:  77 y.o.-year-old patient lying in the bed with no acute distress.  EYES: Pupils equal, round, reactive to light and accommodation. No scleral icterus. Extraocular muscles intact.  HEENT: Head atraumatic, normocephalic. Oropharynx and nasopharynx clear.  NECK:  Supple, no jugular venous distention. No thyroid enlargement, no tenderness.  LUNGS: Normal breath sounds bilaterally, no wheezing, rales,rhonchi or crepitation. No use of accessory muscles of respiration.  CARDIOVASCULAR: S1, S2 normal. No murmurs, rubs, or gallops.  ABDOMEN: Soft, nontender, nondistended. Bowel sounds present. No organomegaly or mass.  EXTREMITIES: No pedal edema, cyanosis, or clubbing.  NEUROLOGIC: Cranial nerves II through XII are intact. Muscle strength 5/5 in all extremities. Sensation intact. Gait not checked.   PSYCHIATRIC: The patient is alert and oriented x 3.  SKIN: No obvious rash, lesion, or ulcer.   Physical Exam LABORATORY PANEL:   CBC Recent Labs  Lab 09/03/17 0533  WBC 22.1*  HGB 9.2*  HCT 28.2*  PLT 381   ------------------------------------------------------------------------------------------------------------------  Chemistries  Recent Labs  Lab 09/05/17 0614 09/19/2017 0549  NA 138 132*  K 3.9 4.2  CL 110 105  CO2 17* 16*  GLUCOSE 148* 125*  BUN 95* 140*  CREATININE 1.99* 3.60*  CALCIUM 7.8* 7.4*  MG 2.4 2.3  AST 127*  --   ALT 52*  --   ALKPHOS 200*  --   BILITOT 0.6  --    ------------------------------------------------------------------------------------------------------------------  Cardiac Enzymes No results for input(s): TROPONINI in the last 168 hours. ------------------------------------------------------------------------------------------------------------------  RADIOLOGY:  No results found.  ASSESSMENT AND PLAN:  77 year old female patient with history of hypertension, hyperlipidemia, hypothyroidism referred from GI clinic for direct admission for abdominal discomfort.  * Acute hypoxic resp failure Aspiration? Bronchitis? Pulm edema. 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 Alice Conley Discussed with Dr. Katherine Conley prognosis, await further recommendations Not a chemotherapy candidate at this time  * Abdominal wall infection with colonic fistulaand stool in wound vac Wound vac removed On unasyn, vancomycin. Fluconazole added7/04/2017 General surgery input appreciated-await further recommendations  *  Hypokalemia and hypomagnesemia Resolved  * Chronic benign essential hypertension Continue current regiment  * ChronicHyperlipidemia Resume  statinupon discharge  Prognosis terminal/dismal  On TPN   All the records are reviewed and case discussed with Care Management/Social Workerr. Management plans discussed with the patient, family and they are in agreement.  CODE STATUS: dnr  TOTAL TIME TAKING CARE OF THIS PATIENT: 35 minutes.     POSSIBLE D/C IN 5 DAYS, DEPENDING ON CLINICAL CONDITION.   Alicia Conley M.D on 09/04/2017   Between 7am to 6pm - Pager - [phone]  After 6pm go to [email or web address] - password [email or web address] [facility]  Office  [phone]  CC: Primary care physician; Alicia Curry, MD  Note: This dictation was prepared with Dragon dictation along with smaller phrase technology. Any transcriptional errors that result from this process are unintentional.

## 2017-09-07 NOTE — Progress Notes (Signed)
Alicia Conley NOTE   Pharmacy Consult for TPN Indication: Inadequate oral intake related to poor appetite, nausea, vomiting   Patient Measurements: Height: 5\' 5"  (165.1 cm) Weight: 299 lb 13.2 oz (136 kg) IBW/kg (Calculated) : 57 TPN AdjBW (KG): 74.5 Body mass index is 49.89 kg/m.  Assessment:  77 yo female with distal bowel obstruction.   TPN Access: central line in place TPN start date: 08/27/17 Nutritional Goals (per RD recommendation on 08/27/17): KCal: 2014 Protein: 100g Fluid: 2292  This TPN provides 100 g of protein, 150 g of dextrose, and 60 g of lipids which provides 1776 kCals per day, meeting 90% of patient needs  Goal TPN rate is 83 ml/hr  Plan:  Continue Clinimix plain 5/15 TPN without lytes at 43mL/hr with MVI and trace elements. Restart 20% lipids at 25 mL/hr for 12 hours per dietician.  Continue current SSI q4h order Clear liquid diet ordered and patient not eating much Monitor TPN labs in AM  Alicia Conley K, [facility] 09/15/2017,11:03 AM

## 2017-09-07 NOTE — Progress Notes (Signed)
Increased dyspnea with grunting

## 2017-09-07 NOTE — Clinical Social Work Note (Addendum)
CSW received consult for comfort care. CSW has contacted the [facility] to inquire about bed availability. The CSW is waiting for call back.  UPDATE: [facility] The CSW has sent a referral so the patient can be placed on the wait list. CSW is following.  Santiago Bumpers, MSW, Latanya Presser [phone]

## 2017-09-07 NOTE — Progress Notes (Signed)
   09/04/2017 1139  Clinical Encounter Type  Visited With Patient and family together  Visit Type Follow-up  Spiritual Encounters  Spiritual Needs Emotional   Chaplain met with patient's spouse and family (sister, brother-in-law and granddaughter), offering emotional support and utilizing active and reflective listening during family's dialogue.  Church friend arrived and chaplain excused herself.  Chaplain will continue to follow.

## 2017-09-07 NOTE — Progress Notes (Signed)
   09/27/2017 1945  Clinical Encounter Type  Visited With Patient and family together  Visit Type Follow-up   Chaplain followed up with patient and family; no needs reported at present.  Chaplain encouraged family to have chaplain paged as needed.

## 2017-09-07 NOTE — Progress Notes (Signed)
   09/13/2017 2015  Clinical Encounter Type  Visited With Health care provider  Visit Type Follow-up  Referral From Other (Comment) (Financial controller)  Consult/Referral To Chaplain   Chaplain responded to page from unit staff.  Upon arrival, chaplain checked in with patient nurse and Financial controller.  Nurse wanted chaplain to be aware of patient's being comfort care; chaplain updated nurse on chaplain involvement with patient/family and encouraged her to page chaplain as needed.

## 2017-09-07 NOTE — Progress Notes (Signed)
Follow up - Critical Care Medicine Note CC  follow up Resp failure  HPI +resp distress On/off biPAP In pain +suffering obtunded  Lines, Airways, Drains: Airway 7 mm (Active)  Secured at (cm) 23 cm 08/29/2017  8:02 AM  Measured From Lips 08/29/2017  8:02 AM  Secured Location Right 08/29/2017  8:02 AM  Secured By [employer] 08/29/2017  8:02 AM  Tube Holder Repositioned Yes 08/29/2017  4:03 AM  Cuff Pressure (cm H2O) 24 cm H2O 08/29/2017  8:02 AM  Site Condition Dry 08/29/2017  4:03 AM     PICC Double Lumen 08/27/17 PICC Right Cephalic 43 cm 0 cm (Active)  Indication for Insertion or Continuance of Line Administration of hyperosmolar/irritating solutions (i.e. TPN, Vancomycin, etc.) 08/30/2017  8:00 AM  Exposed Catheter (cm) 0 cm 08/27/2017  3:00 PM  Site Assessment Clean;Dry;Intact 08/30/2017  7:53 PM  Lumen #1 Status Infusing 08/20/2017  7:53 PM  Lumen #2 Status Infusing 08/08/2017  7:53 PM  Dressing Type Transparent;Securing device 08/30/2017  7:53 PM  Dressing Status Clean;Dry;Intact;Antimicrobial disc in place 08/16/2017  7:53 PM  Dressing Change Due 08/04/17 08/22/2017  7:53 PM     Closed System Drain Other (Comment);Left LLQ Bulb (JP) (Active)  Site Description Unremarkable 08/27/2017  8:00 PM  Dressing Status Clean;Dry;Intact 08/30/2017  4:00 AM  Drainage Appearance Serosanguineous 08/29/2017 12:30 AM  Status To suction (Charged) 08/29/2017 12:30 AM  Intake (mL) 90 ml 09/01/2017  8:47 AM  Output (mL) 10 mL 08/30/2017  5:00 AM     Open Drain 1 Left;Midline;Lateral Abdomen  (Active)  Site Description Unable to view 08/27/2017  7:30 PM  Dressing Status Clean;Dry 08/27/2017  7:30 PM  Drainage Appearance Brown 08/27/2017  8:00 AM  Status Unclamped 08/27/2017  8:00 AM  Output (mL) 0 mL 08/11/2017  7:30 PM     Negative Pressure Wound Therapy Abdomen Mid (Active)  Site / Wound Assessment Clean;Dry 08/30/2017  4:00 AM  Peri-wound Assessment Intact 08/07/2017  7:30 PM  Output (mL) 0 mL  08/20/2017  6:00 PM     Urethral Catheter K Somers RN Latex 16 Fr. (Active)  Indication for Insertion or Continuance of Catheter Unstable critical patients (first 24-48 hours) 08/30/2017  4:00 AM  Site Assessment Clean 08/30/2017  4:00 AM  Catheter Maintenance Bag below level of bladder;Catheter secured;Drainage bag/tubing not touching floor;Insertion date on drainage bag;No dependent loops;Seal intact 08/30/2017  8:00 AM  Collection Container Standard drainage bag 08/30/2017  4:00 AM  Securement Method Securing device (Describe) 08/30/2017  4:00 AM  Output (mL) 225 mL 08/30/2017  7:47 AM    Anti-infectives:  Anti-infectives (From admission, onward)   Start     Dose/Rate Route Frequency Ordered Stop   09/05/17 0200  vancomycin (VANCOCIN) IVPB 1000 mg/200 mL premix  Status:  Discontinued     1,000 mg 200 mL/hr over 60 Minutes Intravenous Every 24 hours 09/05/17 0210 09/06/17 1056   09/04/17 1200  fluconazole (DIFLUCAN) IVPB 200 mg     200 mg 100 mL/hr over 60 Minutes Intravenous Daily 09/04/17 1103     09/04/17 1000  fluconazole (DIFLUCAN) IVPB 400 mg  Status:  Discontinued     400 mg 100 mL/hr over 120 Minutes Intravenous Daily 09/03/17 1239 09/04/17 1103   09/03/17 2200  vancomycin (VANCOCIN) 1,250 mg in sodium chloride 0.9 % 250 mL IVPB  Status:  Discontinued     1,250 mg 166.7 mL/hr over 90 Minutes Intravenous Every 24 hours 09/03/17 1716 09/05/17 0210   09/03/17  1700  piperacillin-tazobactam (ZOSYN) IVPB 3.375 g     3.375 g 12.5 mL/hr over 240 Minutes Intravenous Every 8 hours 09/03/17 1655     09/03/17 1500  fluconazole (DIFLUCAN) IVPB 400 mg  Status:  Discontinued     400 mg 100 mL/hr over 120 Minutes Intravenous  Once 09/03/17 1243 09/03/17 1245   09/03/17 1500  fluconazole (DIFLUCAN) IVPB 400 mg     400 mg 100 mL/hr over 120 Minutes Intravenous  Once 09/03/17 1252 09/03/17 1803   09/03/17 1300  fluconazole (DIFLUCAN) IVPB 400 mg  Status:  Discontinued     400 mg 100 mL/hr over  120 Minutes Intravenous  Once 09/03/17 1243 09/03/17 1245   09/03/17 1300  fluconazole (DIFLUCAN) IVPB 400 mg     400 mg 100 mL/hr over 120 Minutes Intravenous  Once 09/03/17 1252 09/03/17 1602   09/03/17 1245  fluconazole (DIFLUCAN) IVPB 800 mg  Status:  Discontinued     800 mg 100 mL/hr over 240 Minutes Intravenous  Once 09/03/17 1239 09/03/17 1243   08/31/17 1800  vancomycin (VANCOCIN) 1,500 mg in sodium chloride 0.9 % 500 mL IVPB  Status:  Discontinued     1,500 mg 250 mL/hr over 120 Minutes Intravenous Every 24 hours 08/31/17 1044 09/03/17 1716   08/31/17 1200  vancomycin (VANCOCIN) IVPB 1000 mg/200 mL premix     1,000 mg 200 mL/hr over 60 Minutes Intravenous  Once 08/31/17 1044 08/31/17 1202   08/30/17 1045  Ampicillin-Sulbactam (UNASYN) 3 g in sodium chloride 0.9 % 100 mL IVPB  Status:  Discontinued     3 g 200 mL/hr over 30 Minutes Intravenous Every 6 hours 08/30/17 1034 09/03/17 1655   08/27/2017 1800  piperacillin-tazobactam (ZOSYN) IVPB 3.375 g  Status:  Discontinued     3.375 g 12.5 mL/hr over 240 Minutes Intravenous Every 8 hours 08/11/2017 1637 08/26/17 0126   08/03/2017 1730  piperacillin-tazobactam (ZOSYN) IVPB 3.375 g  Status:  Discontinued     3.375 g 12.5 mL/hr over 240 Minutes Intravenous Every 8 hours 08/06/2017 1728 08/26/17 0136   08/27/2017 1445  piperacillin-tazobactam (ZOSYN) IVPB 3.375 g     3.375 g 100 mL/hr over 30 Minutes Intravenous  Once 08/05/2017 1437 08/16/2017 1516   08/24/17 1800  cefOXitin (MEFOXIN) 2 g in sodium chloride 0.9 % 100 mL IVPB  Status:  Discontinued     2 g 200 mL/hr over 30 Minutes Intravenous Every 6 hours 08/24/17 1606 08/22/2017 0844      Microbiology:  Consults: Treatment Team:  Jules Husbands, MD Efrain Sella, MD Earlie Server, MD    Objective:  Vital signs for last 24 hours: Temp:  [97.3 F (36.3 C)-97.9 F (36.6 C)] 97.9 F (36.6 C) (07/06 0000) Pulse Rate:  [61-72] 63 (07/06 0600) Resp:  [8-13] 8 (07/06 0600) BP:  (114-143)/(40-105) 114/51 (07/06 0600) SpO2:  [97 %-100 %] 99 % (07/06 0600) Weight:  [299 lb 13.2 oz (136 kg)] 299 lb 13.2 oz (136 kg) (07/06 0351)  Hemodynamic parameters for last 24 hours:    Intake/Output from previous day: 07/05 0701 - 07/06 0700 In: 1296.5 [P.O.:120; I.V.:1011.7; IV Piggyback:164.8] Out: 100 [Urine:100]  Intake/Output this shift: No intake/output data recorded.  Vent settings for last 24 hours:   Physical Exam:  +acute distress Cardiovascular: Regular rate and rhythm Pulmonary: Clear to auscultation Abdominal: soft, obese, and non-distendedwith moderate peri-incisional tenderness to palpation, feces draining from surgical incision and through/around colonic decompression catheter, clear yellow fluid in drainage bulb  suction  Extremities:  1+ edema is appreciated Neurologic: No focal deficits noted   Assessment/Plan:  77 yo morbidly obese WF with  distal large bowel obstruction attributed to external compression by extensive diffuse peritoneal carcinomatosis Post-Ops/p laparotomy with unsuccessful attempted colostomy instead with decompressing colon-drainage tube, complicated bypost-surgical leukocytosis and bycomorbidities includingmorbid obesity Feces draining from incision sites  Patient is critically ill Prognosis is very poor resp failure not resolving She has end stage cancer Patient is suffering and dying Family had requested hospice but realistic approach is to make patient comfort care    Critical Care Time devoted to patient care services described in this note is 32 minutes.   Overall, patient is critically ill, prognosis is guarded.  Patient with Multiorgan failure and at high risk for cardiac arrest and death.    Corrin Parker, M.D.  [facility]  Medical Director [place] Director [facility] Cardio-Pulmonary Department

## 2017-09-07 NOTE — Progress Notes (Signed)
Patient's husband and family clearly and consistently state and request that all of patient's care be focused to her comfort. This was discussed with Dr. Mortimer Fries, and orders are being placed accordingly. This was also discussed with patient's RN. Please call if any questions or concerns.  -- Marilynne Drivers Rosana Hoes, MD, [place]: [facility] - Partnering for exceptional care. Office: [phone]

## 2017-09-07 NOTE — Progress Notes (Signed)
   09/13/2017 0928  Clinical Encounter Type  Visited With Patient;Family;Health care provider  Visit Type Follow-up  Spiritual Encounters  Spiritual Needs Emotional;Prayer;Grief support   Chaplain found patient alone in the room.  Chaplain introduced herself to the patient and sat at the bedside.  Patient stared at the ceiling, breath gasping.  Patient nurse entered and shared that patient's spouse was out in the hallway.  Chaplain offered silent and energetic prayers for patient, family, and care team.  Patient's sister came to sit with patient.  Chaplain went to speak with patient's spouse and church friends.  Spouse spoke of past losses (wife and father) and impact of grief.  He shared thoughts and feelings regarding patient's approach to her illness and impact of her decline.  Spouse went to check on patient.  Chaplain will continue to follow.

## 2017-09-10 ENCOUNTER — Telehealth: Payer: Self-pay

## 2017-09-10 NOTE — Telephone Encounter (Signed)
Recieved Death Certificate from ______[facility]____ Delivered/Placed __in nurse box__________

## 2017-09-11 NOTE — Telephone Encounter (Signed)
Informed Jimmy at [facility] that death cert is ready to be picked up. Nothing further needed.

## 2017-09-12 ENCOUNTER — Telehealth: Payer: Self-pay | Admitting: Genetic Counselor

## 2017-09-12 NOTE — Telephone Encounter (Signed)
Cancer Genetics             Telegenetics Results Disclosure   Patient Name: Alicia Conley Patient DOB: 1940/04/28 Patient Age: 77 y.o. Phone Call Date: 09/12/2017  Referring Provider: Earlie Server, MD    Alicia Conley's family was called today to discuss genetic test results. Please see the Genetics telephone note from 09/03/2017 for a detailed discussion of her personal and family histories and the recommendations provided. Alicia Conley passed away on 28-Sep-2017. The following information was discussed with her daughter, [name].  Genetic Testing: At the time of Alicia Conley's telegenetics visit, she had genetic testing of multiple genes associated with hereditary susceptibility to cancer. Testing included sequencing and deletion/duplication analysis. Testing did not reveal a pathogenic mutation in any of the genes analyzed.  A copy of the genetic test report will be scanned into Epic under the Media tab.  The genes analyzed were the 83 genes on [facility]'s Multi-Cancer panel (ALK, APC, ATM, AXIN2, BAP1, BARD1, BLM, BMPR1A, BRCA1, BRCA2, BRIP1, CASR, CDC73, CDH1, CDK4, CDKN1B, CDKN1C, CDKN2A, CEBPA, CHEK2, CTNNA1, DICER1, DIS3L2, EGFR, EPCAM, FH, FLCN, GATA2, GPC3, GREM1, HOXB13, HRAS, KIT, MAX, MEN1, MET, MITF, MLH1, MSH2, MSH3, MSH6, MUTYH, NBN, NF1, NF2, NTHL1, PALB2, PDGFRA, PHOX2B, PMS2, POLD1, POLE, POT1, PRKAR1A, PTCH1, PTEN, RAD50, RAD51C, RAD51D, RB1, RECQL4, RET, RUNX1, SDHA, SDHAF2, SDHB, SDHC, SDHD, SMAD4, SMARCA4, SMARCB1, SMARCE1, STK11, SUFU, TERC, TERT, TMEM127, TP53, TSC1, TSC2, VHL, WRN, WT1).  Since the current test is not perfect, it is possible that there may be a gene mutation that current testing cannot detect, but that chance is small. It is possible that a different genetic factor, which has not yet been discovered or is not on this panel, is responsible for the cancer diagnoses in the family. Again, the likelihood of this is low.   Family  Members: Family members are at some increased risk of developing cancer, over the general population risk, simply due to the family history. They are recommended to speak with their own providers about appropriate cancer screenings.   Any relative who had cancer at a young age or had a particularly rare cancer may also wish to pursue genetic testing. Genetic counselors can be located in other cities, by visiting the website of the [employer] ([email or web address]) and Field seismologist for a Dietitian by zip code.   Lastly, cancer genetics is a rapidly advancing field and it is possible that new genetic tests will be appropriate for Alicia Conley's family in the future. They are encouraged to remain in contact with Genetics on an annual basis so we can update the family history, and let them know of advances in cancer genetics that may benefit the family. Alicia Conley daughter's questions were answered to her satisfaction today, and she knows she is welcome to call anytime with additional questions.    Steele Berg, MS, [place] Certified Genetic Counselor phone: [phone]

## 2017-09-19 NOTE — Telephone Encounter (Signed)
error 

## 2017-10-03 NOTE — Progress Notes (Signed)
   2017-09-16 0050  Clinical Encounter Type  Visited With Family;Health care provider  Visit Type Death  Referral From Nurse  Consult/Referral To [place] responded to page regarding patient's end of life.  Patient's daughter and granddaughter present at bedside, tearful and making phone calls.  Chaplain offered silent and energetic prayers then assessed family needs.  Both expressed a desire to have space to grieve and make calls.  Chaplain checked in with patient nurse and unit secretary, encouraging them to have chaplain paged as needed.

## 2017-10-03 NOTE — Death Summary Note (Signed)
DEATH SUMMARY   Patient Details  Name: Alicia Conley MRN: [id number] DOB: 1940-07-11  Admission/Discharge Information   Admit Date:  09-18-2017  Date of Death: Date of Death: 10-06-17  Time of Death: Time of Death: 06-10-2053  Length of Stay: June 18, 2022  Referring Physician: [name], MD   Reason(s) for Hospitalization  Abdominal pain due to [place] s/p Laparotomy w peritoneal biopsies 2. Cholostomy tube using 26 Fr Malecot 3. Prevena VAC placement 13 cms length  Diagnoses  Preliminary cause of death:  Secondary Diagnoses (including complications and co-morbidities):  Active Problems:   Abdominal pain   Ileus (HCC)   Large bowel obstruction (HCC)   Abdominal carcinomatosis (HCC)   Goals of care, counseling/discussion   Wound cellulitis after surgery   Brief Hospital Course (including significant findings, care, treatment, and services provided and events leading to death)  Alicia Conley is a 77 y.o. year old female who presented with abdominal pain and 3 week history of constipation. CT abdomen showed carcinomatosis with partial large bowel obstruction. She was taken for surgical intervention during which a Laparotomy w peritoneal biopsies, cholostomy tube using 26 Fr Malecot and prevena VAC placement 13 cms length were performed. She was also found to have multiple peritoneal implants with the entire colon stuck to the peritoneal implants; making impossible to mobilize. A temporary decompression tube was placed and biopsy samples were collected.  She underwent a flexible sigmoidoscopy for colonic obstruction on August 28, 2017.  She then developed respiratory failure during procedure.  She was maintained on the ventilator and subsequently extubated post extubation, patient was transferred out of the ICU with continued surgical and oncology follow-up.  She became delirious and her WBC continued to trend up.  Abdominal wound did not heal; she  developed abdominal cellulitis and a wound VAC was placed.  Patient became more and more encephalopathic.  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  Her overall prognosis was deemed poor and palliative care was consulted.  In consultation with the family, patient was made full comfort care and she expired at 12:55 AM   Pertinent Labs and Studies  Significant Diagnostic Studies Ct Abdomen Pelvis Wo Contrast  Result Date: 08/29/2017 CLINICAL DATA:  Abdominal xray revealed collection of air below the left hemidiaphragm likely intraluminal, however unable to exclude a component of free peritoneal air radiologist recommended CT Abd Pelvis. Patient has abdominal pain. Medical record history reports a laparotomy on 08/31/2017. EXAM: CT ABDOMEN AND PELVIS WITHOUT CONTRAST TECHNIQUE: Multidetector CT imaging of the abdomen and pelvis was performed following the standard protocol without IV contrast. COMPARISON:  Current abdomen radiographs. Prior CT dated 08/22/2017. FINDINGS: Lower chest: Small pleural effusions. There is dependent lung base opacity, left greater than right, consistent with atelectasis. Heart is normal in size. Hepatobiliary: There are 4 low-density liver masses consistent with cysts, largest from the posterior segment of the right lobe measuring 5 cm. No other liver abnormalities. The gallbladder is distended. There is dependent density in the gallbladder consistent with sludge, stones or a combination. No wall thickening. No bile duct dilation. Pancreas: Unremarkable. No pancreatic ductal dilatation or surrounding inflammatory changes. Spleen: Normal in size without focal abnormality. Adrenals/Urinary Tract: No adrenal masses. Mild to moderate right hydronephrosis and hydroureter. Right ureter is dilated into the pelvis. It is decompressed beginning just above the posterior bladder. No left hydronephrosis. Left ureter is normal course and in caliber. No intrarenal  stones. Hyperattenuating mass, average Hounsfield  units of 39, arises from the upper pole the right kidney, likely a cyst complicated by hemorrhage or proteinaceous contents. It measures 15 mm. There is a smaller more subtle similar-appearing mass from the medial upper pole the left kidney. No other renal masses. Bladder is decompressed by Foley catheter. Stomach/Bowel: The distal sigmoid colon is irregular with adjacent nodular opacities, consistent with colon carcinoma. Above this irregular area of sigmoid colon lies a stent which extends from the mid sigmoid colon to the lower descending colon. There is a percutaneously placed 2 at enters the left mid abdomen. The tip of this 2 has a configuration consistent with a gastrostomy tube. However, this lies in the:. There is significant subcutaneous air adjacent to the tube in the left mid abdomen lateral to the umbilicus. This portion of the colon is mostly decompressed. Right colon is normal in caliber. An no inflammatory changes. Stomach is mild-to-moderately distended. There is dependent fluid with some dependent contrast. No wall thickening or adjacent inflammation. Small bowel is normal in caliber. There is free intraperitoneal air that is collecting along the anterior mid to upper abdomen, accounting for the abnormality noted on the current radiographs. A trace amount of dense material lies adjacent to the spleen where it is in close proximity to the gastric fundus. Other small foci of dense material lie along the surface of the right liver lobe. These findings were present on the CT from 08/22/2017 and suggest peritoneal carcinomatosis. No ascites. Vascular/Lymphatic: No pathologically enlarged lymph nodes. Aortic atherosclerosis. Reproductive: Uterus is normal size and configuration. No ovarian/adnexal masses. Other: There is postsurgical scarring with apparent hernia mesh along the anterior abdominal midline stable from the prior CT. A surgical drainage  catheter enters the left lower quadrant. Musculoskeletal: No acute fracture or acute finding. 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 No osteoblastic or osteolytic lesions. IMPRESSION: 1. There is free intraperitoneal air. This is greater than expected 4 days post laparotomy, but still could be postsurgical in origin. 2. There is no definite source for this air. However, there is a tube that has the appearance of a gastrostomy tube. This enters the left transverse colon. This is likely a colonic tube for decompression. There is air surrounding the tube in the subcutaneous soft tissues at its insertion site. This is a possible source of an air leak. 3. 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 4. There is mild to moderate right hydronephrosis and right hydroureter. The partial obstruction is at the level of the neoplastic changes around the sigmoid colon. This is also stable from prior CT. Electronically Signed   By: Lajean Manes M.D.   On: 08/29/2017 20:59   Dg Abd 1 View  Result Date: 09/01/2017 CLINICAL DATA:  Carcinomatosis with large-bowel obstruction. Bilious vomiting. EXAM: ABDOMEN - 1 VIEW COMPARISON:  CT scan August 29, 2017 FINDINGS: There is a feathery pattern of gas identified over the abdomen. Some of this gas extend outside of the abdominal cavity. Based on previous CT imaging, I suspect this represents increasing gas in the subcutaneous tissues. This significantly obscures evaluation of the abdomen. No convincing evidence of free air. A stent and tube again project over the pelvis/lower abdomen. Contrast is seen in the rectum. Hernia mesh is noted. No definitive distended bowel to suggest ongoing obstruction. IMPRESSION: 1. The feathery pattern of air diffusely over the lower chest and abdomen suggests  increasing air in the subcutaneous fat. This  significantly obscures the remainder of the abdomen but there is no definitive bowel dilatation to suggest ongoing obstruction. Electronically Signed   By: Dorise Bullion III M.D   On: 09/01/2017 18:50   Dg Abd 1 View  Result Date: 08/10/2017 CLINICAL DATA:  Initial evaluation for enteric tube placement. EXAM: ABDOMEN - 1 VIEW COMPARISON:  None. FINDINGS: Enteric tube in place with tip in side hole seen overlying the stomach, well beyond the GE junction. Tip projects distally. Visualized bowel gas pattern within normal limits. IMPRESSION: Enteric tube in place with tip and side hole overlying the stomach, well beyond the GE junction. Electronically Signed   By: Jeannine Boga M.D.   On: 08/08/2017 20:20   Ct Abdomen Pelvis W Contrast  Result Date: 08/22/2017 CLINICAL DATA:  Follow-up CT scan concerning for bowel obstruction. LEFT lower quadrant pain abdominal pain for 2 weeks. Last bowel movement 1 week ago. EXAM: CT ABDOMEN AND PELVIS WITH CONTRAST TECHNIQUE: Multidetector CT imaging of the abdomen and pelvis was performed using the standard protocol following bolus administration of intravenous contrast. CONTRAST:  18mL ISOVUE-300 IOPAMIDOL (ISOVUE-300) INJECTION 61% COMPARISON:  CT 11/28/2013 FINDINGS: Lower chest: Lung bases are clear. Hepatobiliary: Small nodular densities along the surface of the RIGHT hepatic lobe (image 28/2 image 33/2). Nodules measure from 3 to 5 mm are partially calcified. Sludge or stones in the gallbladder. Small amount pericholecystic fluid adjacent to fundus of the bladder. No biliary duct dilatation. The low several low-density cysts in the liver. Pancreas: Pancreas is normal. No ductal dilatation. No pancreatic inflammation. Spleen: Normal spleen Adrenals/urinary tract: Adrenal glands are normal. Several intermediate density or enhancing lesions the renal cortex. For example 14 mm lesion along the lateral margin of theRIGHT lower pole (image 44/2). Cortical lesion  measuring 18 mm on the anterior upper pole on the LEFT (image 24/2). There is mild hydroureter on the RIGHT hydroureter extends to the level of the pelvis. There is peritoneal nodularity in the pelvis mesentery measuring 18 mm appears to be fanning out in the colonic mesentery (image 75/2.) This may obstruct the ureter. Bladder is normal Stomach/Bowel: Stomach, duodenum and small bowel are normal. The ascending, transverse and descending colon are fluid-filled and without haustral markings. This fluid-filled colon extends the level sigmoid colon with there is caliber change. This is in the region of the mesenteric nodularity described renal section. The sigmoid colon is narrowed with potential calcified peritoneal densities (image 73/2). Rectum normal Vascular/Lymphatic: Abdominal aorta is normal caliber. No periportal or retroperitoneal adenopathy. No pelvic adenopathy. Reproductive: Uterus normal.  Ovaries not identified Other: There is a ventral hernia repair. There is enhancing nodularity along the ventral peritoneal surface with a frondlike appearance (image 40/2 for example). Small frondlike cluster measures 21 mm (image 40/2). This is similar to the mesenteric nodularity in the pelvis as well as the nodularity along the RIGHT hepatic lobe. Small calcified nodules also extends along the LEFT pericolic gutter. Musculoskeletal: No aggressive osseous lesion. IMPRESSION: 1. The ascending, transverse and descending colon is ahaustral, fluid-filled, and mildly distended to the level of sigmoid colon. A caliber change at sigmoid colon with serosal nodularity concerning for obstruction from peritoneal carcinomatosis. 2. Nodular lesions which are partially calcified along the hepatic margin, pelvic sigmoid mesocolon pelvis as well as the ventral peritoneal space. 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 Partially calcified nodularity suggest potential ovarian source. The ventral midline peritoneal nodularity  may most assessable for biopsy. 3. Mild entrapment  of the distal RIGHT ureter associated with the mesenteric nodularity. 4. Indeterminate renal lesions. Recommend follow-up MRI renal protocol at some point. These results will be called to the ordering clinician or representative by the Radiologist Assistant, and communication documented in the PACS or zVision Dashboard. Electronically Signed   By: Suzy Bouchard M.D.   On: 08/22/2017 15:09   Dg Abd 2 Views  Result Date: 08/20/2017 CLINICAL DATA:  Bowel obstruction EXAM: ABDOMEN - 2 VIEW COMPARISON:  None. FINDINGS: Scattered gas containing small and large bowel loops without obstructive bowel gas pattern. Surgical tacks project over the mid abdomen presumably from ventral hernia repair. No free air, organomegaly nor suspicious radiopaque calculi. Lower lumbar facet arthropathy. IMPRESSION: Nonobstructed, nondistended bowel gas pattern. Electronically Signed   By: Ashley Royalty M.D.   On: 08/31/2017 19:55   Dg Abd Acute W/chest  Result Date: 08/26/2017 CLINICAL DATA:  Follow-up distal colonic obstruction. EXAM: DG ABDOMEN ACUTE W/ 1V CHEST COMPARISON:  August 25, 2017 FINDINGS: There is a small left effusion. The chest is otherwise stable. Lucency over the upper abdomen on the upright view may represent free postoperative air or air within the skin folder incision. Free air would be expected after surgery yesterday. The colonic loops are significantly improved with placement of a colonic tube. No other change. IMPRESSION: 1. A colonic tube is been placed with decompression of the colon. 2. Probable postoperative free air, as expected. 3. No other change. Electronically Signed   By: Dorise Bullion III M.D   On: 08/26/2017 08:33   Dg Abd Acute W/chest  Result Date: 08/29/2017 CLINICAL DATA:  Preoperative examination. EXAM: DG ABDOMEN ACUTE W/ 1V CHEST COMPARISON:  08/24/2017; 08/24/2017; CT abdomen pelvis-08/22/2017 FINDINGS: Grossly unchanged cardiac  silhouette and mediastinal contours with atherosclerotic plaque within the thoracic aorta. Loop recorder overlies the mid heart. Suspected slight reduction in persistent small left-sided effusion. Improved aeration of lung bases with persistent bibasilar opacities, left greater than right. No new focal airspace opacities. Pulmonary venous congestion without frank evidence of edema. No pneumothorax. No acute osseus abnormalities. Read demonstrated patulous distension of multiple loops of colon with index loop of descending colon within the left mid hemiabdomen measuring approximately 5.6 cm in diameter, similar to the 08/24/2017 examination. No pneumoperitoneum, pneumatosis or portal venous gas No definitive abnormal intra-abdominal calcifications. IMPRESSION: 1. Slight reduction in persistent small left-sided effusion and improved aeration of bilateral lung bases with persistent bibasilar opacities, atelectasis versus infiltrate. 2. Similar findings worrisome for distal colonic obstruction as demonstrated on recent abdominal CT. Electronically Signed   By: Sandi Mariscal M.D.   On: 08/30/2017 08:42   Dg Abd Acute W/chest  Result Date: 08/24/2017 CLINICAL DATA:  Lower abdominal pain. EXAM: DG ABDOMEN ACUTE W/ 1V CHEST COMPARISON:  Chest and abdominal x-rays from yesterday. FINDINGS: Stable cardiomediastinal silhouette. Loop recorder again noted. Mild pulmonary vascular congestion, slightly more conspicuous on today's study. Unchanged small left pleural effusion and left basilar atelectasis. No pneumothorax. Stable mild dilatation of the colon. No pneumoperitoneum. No acute osseous abnormality. IMPRESSION: 1. Stable mild colonic dilatation. 2. Unchanged small left pleural effusion and left basilar atelectasis. Electronically Signed   By: Titus Dubin M.D.   On: 08/24/2017 08:44   Dg Abd Acute W/chest  Result Date: 08/20/2017 CLINICAL DATA:  Left lower quadrant pain EXAM: DG ABDOMEN ACUTE W/ 1V CHEST  COMPARISON:  08/22/2017 FINDINGS: Cardiac shadow is within normal limits. Loop recorder is noted. Left basilar atelectatic changes and small effusion are noted  increased from the previous day. Right lung remains clear. No free air is noted. Mild dilatation of the colon is noted similar to that seen on the prior CT examination. Contrast is noted within the bladder. No bony abnormality is seen. IMPRESSION: Stable dilatation of the colon when compared with the prior exam. Increasing left basilar atelectasis and effusion. Electronically Signed   By: Inez Catalina M.D.   On: 08/17/2017 07:21   Dg Abd Portable 2v  Result Date: 08/29/2017 CLINICAL DATA:  Peritoneal carcinomatosis.  Colonic obstruction. EXAM: PORTABLE ABDOMEN - 2 VIEW COMPARISON:  Acute abdominal series 08/26/2017 and KUB 08/08/2017 FINDINGS: Examination demonstrates collection of air under the left hemidiaphragm on the upright film some which is intraluminal although it would be difficult to exclude free peritoneal air. Bowel gas pattern is otherwise nonobstructive. There are 2 catheters over the left lower quadrant/pelvis. Skin staples over the soft tissues of the lateral left pelvis/lower abdomen. Remainder of the exam is unchanged. IMPRESSION: Nonobstructive bowel gas pattern. Collection of air below the left hemidiaphragm on the upright film likely intraluminal although cannot exclude a component of free peritoneal air. Recommend clinical correlation and consider left-side-down decubitus films to exclude free peritoneal air. Two catheters over the left lower quadrant/left pelvis. These results were called by telephone at the time of interpretation on 08/29/2017 at 5:28 pm to patient's nurse Council Mechanic, who verbally acknowledged these results and will notify patient's physician. Electronically Signed   By: Marin Olp M.D.   On: 08/29/2017 17:28   Dg C-arm 1-60 Min-no Report  Result Date: 08/24/2017 Fluoroscopy was utilized by the requesting  physician.  No radiographic interpretation.   [place] Ekg Site Rite  Result Date: 08/27/2017 If Site Rite image not attached, placement could not be confirmed due to current cardiac rhythm.   Microbiology Recent Results (from the past 240 hour(s))  MRSA PCR Screening     Status: None   Collection Time: 08/30/17  3:45 PM  Result Value Ref Range Status   MRSA by PCR NEGATIVE NEGATIVE Final    Comment:        The GeneXpert MRSA Assay (FDA approved for NASAL specimens only), is one component of a comprehensive MRSA colonization surveillance program. It is not intended to diagnose MRSA infection nor to guide or monitor treatment for MRSA infections. Performed at [facility], [address] [place], [place] [address]     Lab Basic Metabolic Panel: Recent Labs  Lab 09/02/17 0305 09/03/17 0533 09/04/17 0433 09/05/17 0614 09/28/2017 0549  NA 142 142 141 138 132*  K 3.9 3.7 3.7 3.9 4.2  CL 113* 112* 112* 110 105  CO2 22 20* 19* 17* 16*  GLUCOSE 118* 201* 174* 148* 125*  BUN 49* 53* 68* 95* 140*  CREATININE 1.07* 1.19* 1.50*  1.43* 1.99* 3.60*  CALCIUM 8.1* 7.9* 7.7* 7.8* 7.4*  MG 2.3 2.1 2.1 2.4 2.3  PHOS 5.5* 6.4* 6.4* 7.0* 7.8*   Liver Function Tests: Recent Labs  Lab 09/02/17 0305 09/03/17 0533 09/05/17 0614  AST 45* 61* 127*  ALT 20 27 52*  ALKPHOS 135* 157* 200*  BILITOT 0.6 0.5 0.6  PROT 5.5* 5.8* 6.0*  ALBUMIN 1.9* 1.8* 2.0*   No results for input(s): LIPASE, AMYLASE in the last 168 hours. No results for input(s): AMMONIA in the last 168 hours. CBC: Recent Labs  Lab 09/01/17 0649 09/02/17 0305 09/03/17 0533  WBC 21.0* 21.5* 22.1*  NEUTROABS  --  19.2* 20.2*  HGB 9.7* 9.9*  9.2*  HCT 30.3* 30.6* 28.2*  MCV 87.4 87.0 90.4  PLT 401 420 381   Cardiac Enzymes: No results for input(s): CKTOTAL, CKMB, CKMBINDEX, TROPONINI in the last 168 hours. Sepsis Labs: Recent Labs  Lab 09/01/17 0649 09/02/17 0305 09/03/17 0533  WBC 21.0* 21.5* 22.1*     Procedures/Operations  1. Flexible sigmoidoscopy  2. Laparotomy w peritoneal biopsies 3. Cholostomy tube using 26 Fr Malecot 4. Prevena VAC placement 13 cms length  Mekayla Soman S. [facility] ANP-BC Pulmonary and Critical Care Medicine [facility] Pager [phone] or [phone]  NB: This document was prepared using Dragon voice recognition software and may include unintentional dictation errors.   09/22/17, 5:52 AM

## 2017-10-03 NOTE — Progress Notes (Signed)
   10-08-17 0200  Clinical Encounter Type  Visited With Family  Visit Type Death  Referral From Nurse  Consult/Referral To Chaplain  Spiritual Encounters  Spiritual Needs Emotional;Grief support;Prayer   Chaplain responded to page regarding family's arrival.  Patient's daughter and granddaughter still at bedside; patient's spouse, sitting bent over and tearful.  Chaplain engaged family in life review of patient.  Family began sharing stories of patient's illness and events leading to this hospitalization.  Patient's step-son came in and joined conversation.  He shared thoughts and feelings regarding the death of his mother and his wife.  Family spoke of family loss and then shifted to physical characteristics patient shared with her mother that have carried on in other family members.  Daughter and granddaughter spoke of importance of nature to them and patient and images they would share with one another.  Family shared feelings around the role patient played in their lives and how she lived her life.  Chaplain utilized active and reflective listening throughout encounter.  Family told patient good-bye then chaplain offered a silent prayer and blessing for the patient.

## 2017-10-03 NOTE — Progress Notes (Signed)
Patient vital signs ceased, pronounced at [id number] by Bridgette Habermann RN and Nena Alexander RN. Jeoffrey Massed NP informed.

## 2017-10-03 NOTE — Progress Notes (Signed)
[facility] contacted, body may be released to funeral home. Daughter  was at bedside with patient and gave funeral home information. Mable Fill was called to notify of patient's passing and came to room. Family will call other members for notification, daughter and grand daughter was at the bedside.

## 2017-10-03 DEATH — deceased

## 2018-11-30 IMAGING — MR MR HEAD W/O CM
10 series · 48 of 48 positions shown · non-contrast
Comparison: No comparison brain MR or head CT.

CLINICAL DATA: 75-year-old female with weakness left side for the
past year. Difficulty using hand with weak leg. No known injury.
Initial encounter.

EXAM:
MRI HEAD WITHOUT CONTRAST
TECHNIQUE: Multiplanar, multiecho pulse sequences of the brain and surrounding
structures were obtained without intravenous contrast.

[Series 2: T1 · sagittal · 5.0mm · 0.45mm/px · 2 of 25 slices shown (1 of 2)]
[im 1/25]
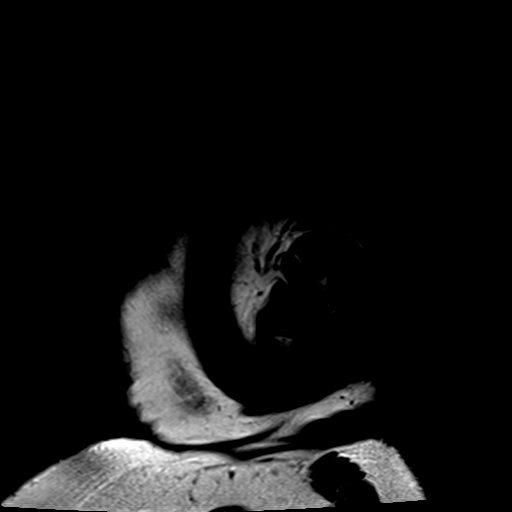
[im 25/25]
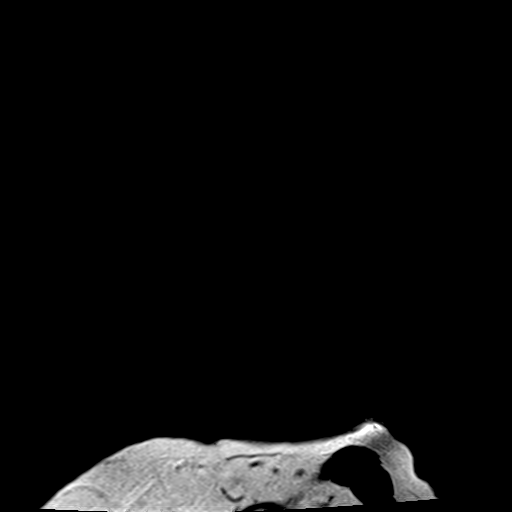

[Series 4: DWI · axial · 3.0mm · 1.20mm/px · z∈[-68,+99]mm · 7 of 57 slices shown (1 of 4)]
[im 1/57]
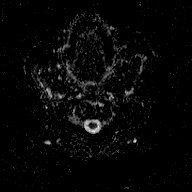
[im 10/57]
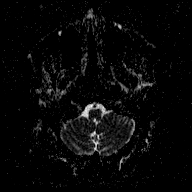
[im 19/57]
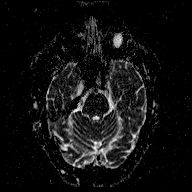
[im 29/57]
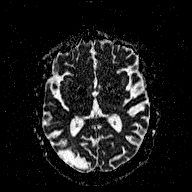
[im 38/57]
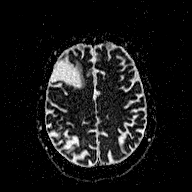
[im 47/57]
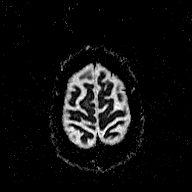
[im 57/57]
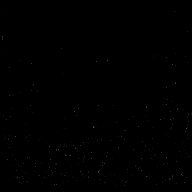

[Series 6: DWI · coronal · 3.0mm · 1.20mm/px · 6 of 50 slices shown (2 of 4)]
[im 1/50]
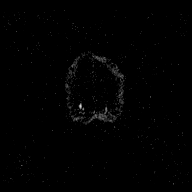
[im 10/50]
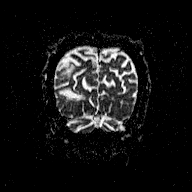
[im 20/50]
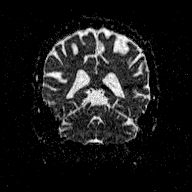
[im 30/50]
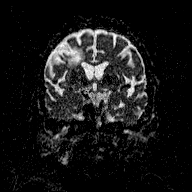
[im 40/50]
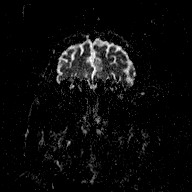
[im 50/50]
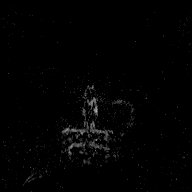

[Series 7: T2 · axial · 5.0mm · 0.72mm/px · z∈[-65,+96]mm · 3 of 26 slices shown (1 of 3)]
[im 1/26]
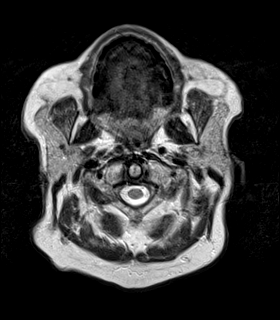
[im 13/26]
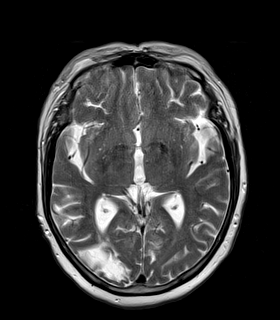
[im 26/26]
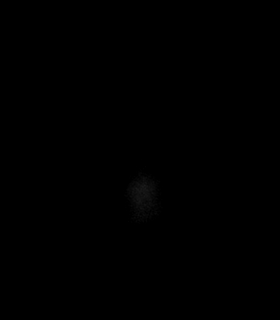

[Series 8: FLAIR · axial · 5.0mm · 0.45mm/px · z∈[-65,+96]mm · 3 of 26 slices shown]
[im 1/26]
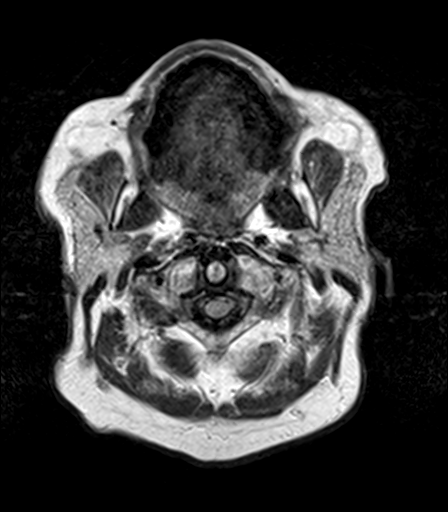
[im 13/26]
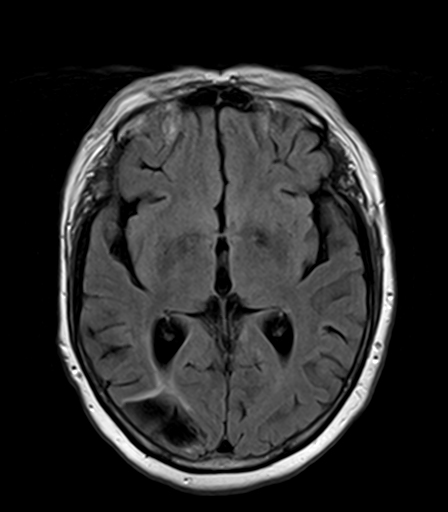
[im 26/26]
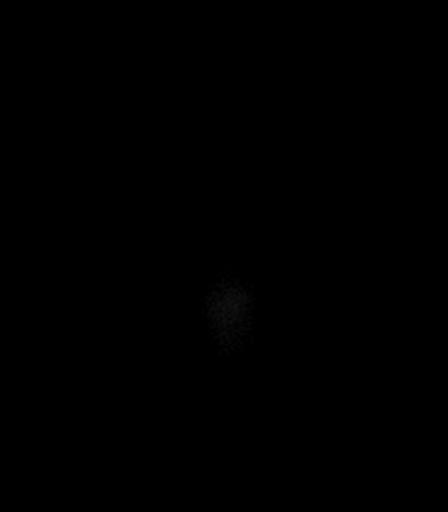

[Series 9: T2 · axial · 5.0mm · 0.72mm/px · z∈[-65,+96]mm · 3 of 26 slices shown (2 of 3)]
[im 1/26]
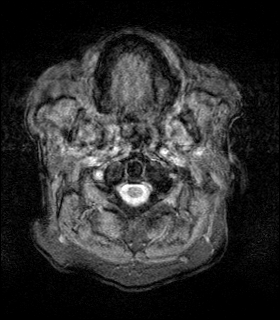
[im 13/26]
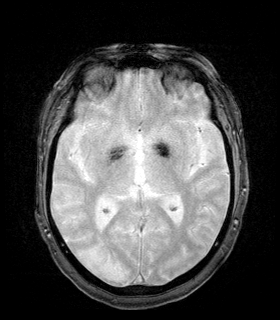
[im 26/26]
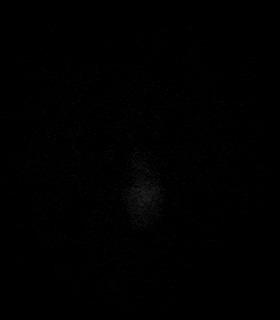

[Series 10: T1 · axial · 3.0mm · 1.00mm/px · z∈[-76,+111]mm · 7 of 64 slices shown (2 of 2)]
[im 1/64]
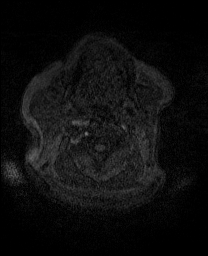
[im 11/64]
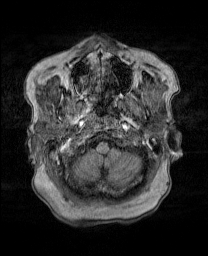
[im 22/64]
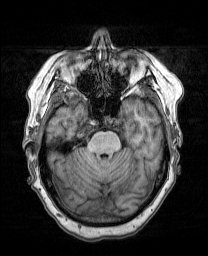
[im 32/64]
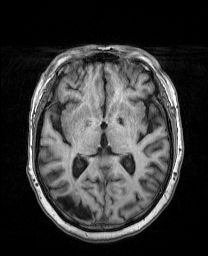
[im 43/64]
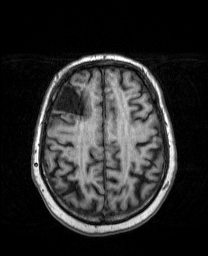
[im 53/64]
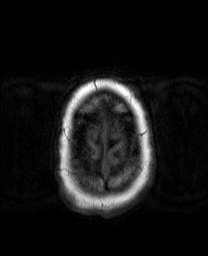
[im 64/64]
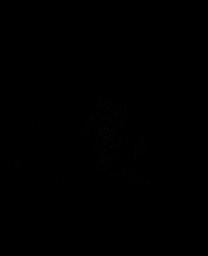

[Series 11: T2 · coronal · 5.0mm · 0.45mm/px · 4 of 31 slices shown (3 of 3)]
[im 1/31]
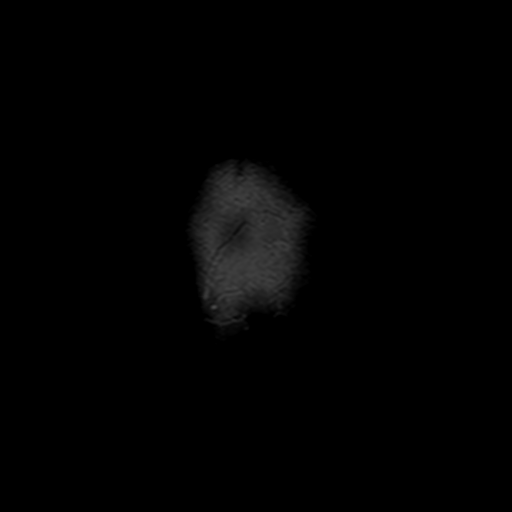
[im 11/31]
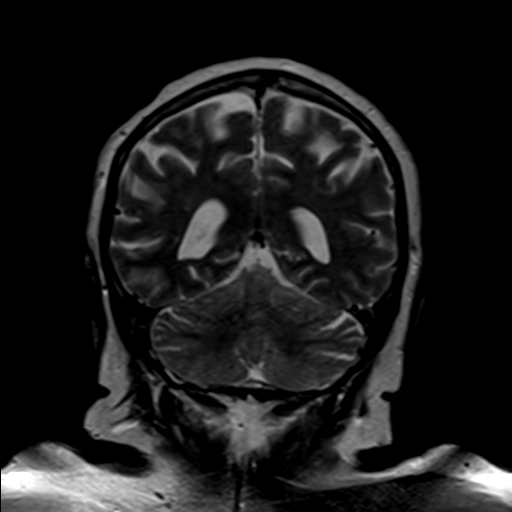
[im 21/31]
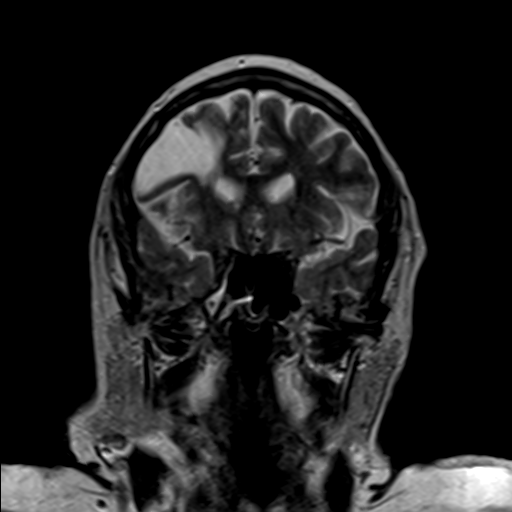
[im 31/31]
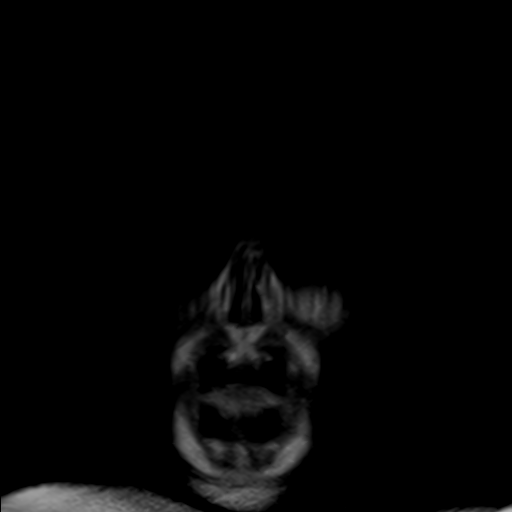

[Series 100: DWI · axial · 3.0mm · 1.20mm/px · z∈[-68,+99]mm · 7 of 57 slices shown (3 of 4)]
[im 1/57]
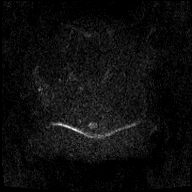
[im 10/57]
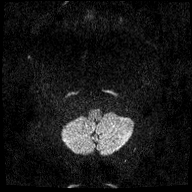
[im 19/57]
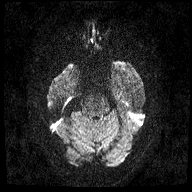
[im 29/57]
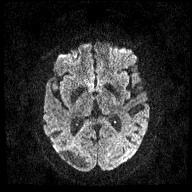
[im 38/57]
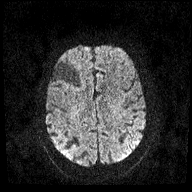
[im 47/57]
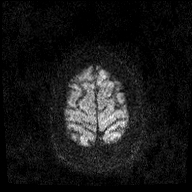
[im 57/57]
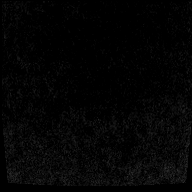

[Series 101: DWI · coronal · 3.0mm · 1.20mm/px · 6 of 50 slices shown (4 of 4)]
[im 1/50]
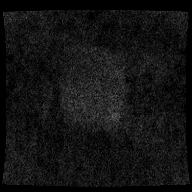
[im 10/50]
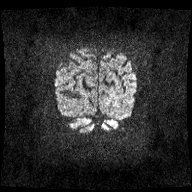
[im 20/50]
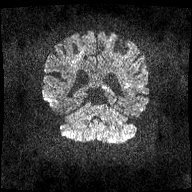
[im 30/50]
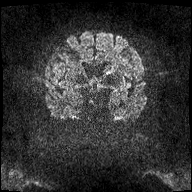
[im 40/50]
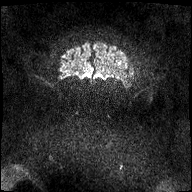
[im 50/50]
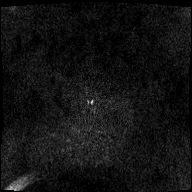

[48 of 48 positions shown; findings below may reference images not displayed]

FINDINGS: Exam is motion degraded.

Brain: No acute infarct

Remote moderate-size right frontal lobe, right parietal lobe and
right occipital lobe infarcts with encephalomalacia.

7 mm area of altered signal intensity left occipital region
suspicious for calcified meningioma with minimal indentation
adjacent gyri without surrounding vasogenic edema. No other
intracranial mass lesion noted on this unenhanced exam.

No intracranial hemorrhage.

Prominent mineralization of the globus pallidus bilaterally. This
can be seen with various metabolic abnormalities including
hyper/hypoparathyroidism.

Global atrophy without hydrocephalus.

Vascular: Left vertebral artery poorly delineated and may end in a
posterior inferior cerebellar artery distribution. Right vertebral
artery, basilar artery, internal carotid arteries and major dural
sinuses are patent.

Skull and upper cervical spine: Cervical spondylotic changes with
spinal stenosis and cord flattening most notable C4-5 followed by
the C3-4 level. Heterogeneous bone marrow without destructive
lesion.

Sinuses/Orbits: Post lens replacement without acute orbital
abnormality. Mild exophthalmos.

Air-fluid level posterior left sphenoid sinus. Minimal to mild
mucosal thickening ethmoid sinus air cells bilaterally.

Other: Negative
IMPRESSION: No acute infarct

Remote moderate-size right frontal lobe, right parietal lobe and
right occipital lobe infarcts with encephalomalacia.

7 mm left occipital region calcified meningioma suspected with
minimal indentation adjacent of gyri without surrounding vasogenic
edema.

Prominent mineralization of the globus pallidus bilaterally. This
can be seen with various metabolic abnormalities including
hyper/hypoparathyroidism.

Global atrophy without hydrocephalus.

Left vertebral artery poorly delineated and may end in a posterior
inferior cerebellar artery distribution.

Cervical spondylotic changes with spinal stenosis and cord
flattening most notable C4-5 followed by the C3-4 level.

Mild exophthalmos.

Air-fluid level posterior left sphenoid sinus. Minimal to mild
mucosal thickening ethmoid sinus air cells bilaterally.
